# Patient Record
Sex: Male | Born: 1940 | Race: White | Hispanic: No | Marital: Single | Smoking: Former smoker
Health system: Southern US, Community
[De-identification: ages and names within clinical notes are randomized; demographics above are authoritative.]

## PROBLEM LIST (undated history)

## (undated) DIAGNOSIS — H3530 Unspecified macular degeneration: Secondary | ICD-10-CM

## (undated) DIAGNOSIS — I10 Essential (primary) hypertension: Secondary | ICD-10-CM

## (undated) DIAGNOSIS — J449 Chronic obstructive pulmonary disease, unspecified: Secondary | ICD-10-CM

## (undated) DIAGNOSIS — E119 Type 2 diabetes mellitus without complications: Secondary | ICD-10-CM

## (undated) DIAGNOSIS — Z800 Family history of malignant neoplasm of digestive organs: Secondary | ICD-10-CM

## (undated) DIAGNOSIS — G4730 Sleep apnea, unspecified: Secondary | ICD-10-CM

## (undated) DIAGNOSIS — E785 Hyperlipidemia, unspecified: Secondary | ICD-10-CM

## (undated) DIAGNOSIS — F32A Depression, unspecified: Secondary | ICD-10-CM

## (undated) DIAGNOSIS — F329 Major depressive disorder, single episode, unspecified: Secondary | ICD-10-CM

## (undated) DIAGNOSIS — B029 Zoster without complications: Secondary | ICD-10-CM

## (undated) HISTORY — DX: Family history of malignant neoplasm of digestive organs: Z80.0

## (undated) HISTORY — DX: Chronic obstructive pulmonary disease, unspecified: J44.9

## (undated) HISTORY — DX: Zoster without complications: B02.9

## (undated) HISTORY — PX: UPPER GASTROINTESTINAL ENDOSCOPY: SHX188

## (undated) HISTORY — DX: Unspecified macular degeneration: H35.30

## (undated) HISTORY — PX: COLONOSCOPY: SHX174

## (undated) HISTORY — PX: APPENDECTOMY: SHX54

## (undated) HISTORY — PX: TONSILLECTOMY: SUR1361

---

## 2013-07-02 ENCOUNTER — Emergency Department (HOSPITAL_COMMUNITY): Payer: MEDICARE

## 2013-07-02 ENCOUNTER — Inpatient Hospital Stay (HOSPITAL_COMMUNITY)
Admission: EM | Admit: 2013-07-02 | Discharge: 2013-07-06 | DRG: 086 | Disposition: A | Discharge status: Home / Self Care | Payer: MEDICARE | Attending: Internal Medicine | Admitting: Internal Medicine

## 2013-07-02 ENCOUNTER — Encounter (HOSPITAL_COMMUNITY): Payer: Self-pay | Admitting: Neurology

## 2013-07-02 DIAGNOSIS — F3289 Other specified depressive episodes: Secondary | ICD-10-CM | POA: Diagnosis present

## 2013-07-02 DIAGNOSIS — F329 Major depressive disorder, single episode, unspecified: Secondary | ICD-10-CM | POA: Diagnosis present

## 2013-07-02 DIAGNOSIS — H3530 Unspecified macular degeneration: Secondary | ICD-10-CM | POA: Diagnosis present

## 2013-07-02 DIAGNOSIS — R296 Repeated falls: Secondary | ICD-10-CM | POA: Diagnosis present

## 2013-07-02 DIAGNOSIS — D72829 Elevated white blood cell count, unspecified: Secondary | ICD-10-CM

## 2013-07-02 DIAGNOSIS — R55 Syncope and collapse: Secondary | ICD-10-CM

## 2013-07-02 DIAGNOSIS — N183 Chronic kidney disease, stage 3 unspecified: Secondary | ICD-10-CM | POA: Diagnosis present

## 2013-07-02 DIAGNOSIS — S066XAA Traumatic subarachnoid hemorrhage with loss of consciousness status unknown, initial encounter: Principal | ICD-10-CM | POA: Diagnosis present

## 2013-07-02 DIAGNOSIS — I129 Hypertensive chronic kidney disease with stage 1 through stage 4 chronic kidney disease, or unspecified chronic kidney disease: Secondary | ICD-10-CM | POA: Diagnosis present

## 2013-07-02 DIAGNOSIS — J181 Lobar pneumonia, unspecified organism: Secondary | ICD-10-CM

## 2013-07-02 DIAGNOSIS — Z87891 Personal history of nicotine dependence: Secondary | ICD-10-CM

## 2013-07-02 DIAGNOSIS — Z79899 Other long term (current) drug therapy: Secondary | ICD-10-CM

## 2013-07-02 DIAGNOSIS — J441 Chronic obstructive pulmonary disease with (acute) exacerbation: Secondary | ICD-10-CM

## 2013-07-02 DIAGNOSIS — E785 Hyperlipidemia, unspecified: Secondary | ICD-10-CM | POA: Diagnosis present

## 2013-07-02 DIAGNOSIS — N179 Acute kidney failure, unspecified: Secondary | ICD-10-CM | POA: Diagnosis present

## 2013-07-02 DIAGNOSIS — I609 Nontraumatic subarachnoid hemorrhage, unspecified: Secondary | ICD-10-CM | POA: Diagnosis present

## 2013-07-02 DIAGNOSIS — E119 Type 2 diabetes mellitus without complications: Secondary | ICD-10-CM | POA: Diagnosis present

## 2013-07-02 DIAGNOSIS — F32A Depression, unspecified: Secondary | ICD-10-CM

## 2013-07-02 DIAGNOSIS — Z7982 Long term (current) use of aspirin: Secondary | ICD-10-CM

## 2013-07-02 DIAGNOSIS — S066X9A Traumatic subarachnoid hemorrhage with loss of consciousness of unspecified duration, initial encounter: Principal | ICD-10-CM | POA: Diagnosis present

## 2013-07-02 DIAGNOSIS — R29810 Facial weakness: Secondary | ICD-10-CM | POA: Diagnosis present

## 2013-07-02 DIAGNOSIS — I951 Orthostatic hypotension: Secondary | ICD-10-CM | POA: Diagnosis present

## 2013-07-02 DIAGNOSIS — J189 Pneumonia, unspecified organism: Secondary | ICD-10-CM

## 2013-07-02 DIAGNOSIS — I639 Cerebral infarction, unspecified: Secondary | ICD-10-CM

## 2013-07-02 HISTORY — DX: Type 2 diabetes mellitus without complications: E11.9

## 2013-07-02 HISTORY — DX: Hyperlipidemia, unspecified: E78.5

## 2013-07-02 HISTORY — DX: Depression, unspecified: F32.A

## 2013-07-02 HISTORY — DX: Essential (primary) hypertension: I10

## 2013-07-02 HISTORY — DX: Major depressive disorder, single episode, unspecified: F32.9

## 2013-07-02 LAB — COMPREHENSIVE METABOLIC PANEL
ALT: 22 U/L (ref 0–53)
ALT: 22 U/L (ref 0–53)
AST: 21 U/L (ref 0–37)
AST: 19 U/L (ref 0–37)
Albumin: 3.8 g/dL (ref 3.5–5.2)
Albumin: 3.8 g/dL (ref 3.5–5.2)
Alkaline Phosphatase: 60 U/L (ref 39–117)
Alkaline Phosphatase: 63 U/L (ref 39–117)
BUN: 34 mg/dL — ABNORMAL HIGH (ref 6–23)
BUN: 32 mg/dL — ABNORMAL HIGH (ref 6–23)
CO2: 20 mEq/L (ref 19–32)
CO2: 22 mEq/L (ref 19–32)
Calcium: 9.4 mg/dL (ref 8.4–10.5)
Calcium: 9.7 mg/dL (ref 8.4–10.5)
Chloride: 101 mEq/L (ref 96–112)
Chloride: 102 mEq/L (ref 96–112)
Creatinine, Ser: 1.62 mg/dL — ABNORMAL HIGH (ref 0.50–1.35)
Creatinine, Ser: 1.55 mg/dL — ABNORMAL HIGH (ref 0.50–1.35)
GFR calc Af Amer: 47 mL/min — ABNORMAL LOW (ref >90)
GFR calc Af Amer: 50 mL/min — ABNORMAL LOW (ref >90)
GFR calc non Af Amer: 41 mL/min — ABNORMAL LOW (ref >90)
GFR calc non Af Amer: 43 mL/min — ABNORMAL LOW (ref >90)
Glucose, Bld: 134 mg/dL — ABNORMAL HIGH (ref 70–99)
Glucose, Bld: 123 mg/dL — ABNORMAL HIGH (ref 70–99)
Potassium: 4.8 mEq/L (ref 3.7–5.3)
Potassium: 4.7 mEq/L (ref 3.7–5.3)
Sodium: 139 mEq/L (ref 137–147)
Sodium: 140 mEq/L (ref 137–147)
Total Bilirubin: 0.3 mg/dL (ref 0.3–1.2)
Total Bilirubin: 0.4 mg/dL (ref 0.3–1.2)
Total Protein: 7.3 g/dL (ref 6.0–8.3)
Total Protein: 7.5 g/dL (ref 6.0–8.3)

## 2013-07-02 LAB — URINALYSIS, ROUTINE W REFLEX MICROSCOPIC
Glucose, UA: NEGATIVE mg/dL
Hgb urine dipstick: NEGATIVE
Ketones, ur: 15 mg/dL — AB
Leukocytes, UA: NEGATIVE
Nitrite: NEGATIVE
Protein, ur: NEGATIVE mg/dL
Specific Gravity, Urine: 1.03 (ref 1.005–1.030)
Urobilinogen, UA: 0.2 mg/dL (ref 0.0–1.0)
pH: 5 (ref 5.0–8.0)

## 2013-07-02 LAB — GLUCOSE, CAPILLARY: Glucose-Capillary: 190 mg/dL — ABNORMAL HIGH (ref 70–99)

## 2013-07-02 LAB — ETHANOL: Alcohol, Ethyl (B): <11 mg/dL (ref 0–11)

## 2013-07-02 LAB — DIFFERENTIAL
Basophils Absolute: 0 10*3/uL (ref 0.0–0.1)
Basophils Relative: 0 % (ref 0–1)
Eosinophils Absolute: 0.4 10*3/uL (ref 0.0–0.7)
Eosinophils Relative: 3 % (ref 0–5)
Lymphocytes Relative: 23 % (ref 12–46)
Lymphs Abs: 2.4 10*3/uL (ref 0.7–4.0)
Monocytes Absolute: 0.9 10*3/uL (ref 0.1–1.0)
Monocytes Relative: 8 % (ref 3–12)
Neutro Abs: 7 10*3/uL (ref 1.7–7.7)
Neutrophils Relative %: 66 % (ref 43–77)

## 2013-07-02 LAB — CBC
HCT: 36.6 % — ABNORMAL LOW (ref 39.0–52.0)
Hemoglobin: 12.3 g/dL — ABNORMAL LOW (ref 13.0–17.0)
MCH: 30.2 pg (ref 26.0–34.0)
MCHC: 33.6 g/dL (ref 30.0–36.0)
MCV: 89.9 fL (ref 78.0–100.0)
Platelets: 233 10*3/uL (ref 150–400)
RBC: 4.07 MIL/uL — ABNORMAL LOW (ref 4.22–5.81)
RDW: 13.2 % (ref 11.5–15.5)
WBC: 10.6 10*3/uL — ABNORMAL HIGH (ref 4.0–10.5)

## 2013-07-02 LAB — RAPID URINE DRUG SCREEN, HOSP PERFORMED
Amphetamines: POSITIVE — AB
Barbiturates: NOT DETECTED
Benzodiazepines: NOT DETECTED
Cocaine: NOT DETECTED
Opiates: NOT DETECTED
Tetrahydrocannabinol: NOT DETECTED

## 2013-07-02 LAB — I-STAT CHEM 8, ED
BUN: 33 mg/dL — ABNORMAL HIGH (ref 6–23)
Calcium, Ion: 1.26 mmol/L (ref 1.13–1.30)
Chloride: 106 mEq/L (ref 96–112)
Creatinine, Ser: 1.8 mg/dL — ABNORMAL HIGH (ref 0.50–1.35)
Glucose, Bld: 137 mg/dL — ABNORMAL HIGH (ref 70–99)
HCT: 39 % (ref 39.0–52.0)
Hemoglobin: 13.3 g/dL (ref 13.0–17.0)
Potassium: 4.6 mEq/L (ref 3.7–5.3)
Sodium: 141 mEq/L (ref 137–147)
TCO2: 20 mmol/L (ref 0–100)

## 2013-07-02 LAB — APTT: aPTT: 27 seconds (ref 24–37)

## 2013-07-02 LAB — PROTIME-INR
INR: 1.03 (ref 0.00–1.49)
Prothrombin Time: 13.3 seconds (ref 11.6–15.2)

## 2013-07-02 LAB — I-STAT TROPONIN, ED: Troponin i, poc: 0 ng/mL (ref 0.00–0.08)

## 2013-07-02 MED ORDER — FERROUS SULFATE 325 (65 FE) MG PO TBEC
325.0000 mg | DELAYED_RELEASE_TABLET | Freq: Every day | ORAL | Status: DC
  Administered 2013-07-03 – 2013-07-06 (×4): 325 mg via ORAL
  Filled 2013-07-02 (×6): qty 1

## 2013-07-02 MED ORDER — ADULT MULTIVITAMIN W/MINERALS CH
1.0000 | ORAL_TABLET | Freq: Every day | ORAL | Status: DC
  Administered 2013-07-02 – 2013-07-06 (×5): 1 via ORAL
  Filled 2013-07-02 (×5): qty 1

## 2013-07-02 MED ORDER — BUPROPION HCL ER (SR) 150 MG PO TB12
400.0000 mg | ORAL_TABLET | Freq: Every morning | ORAL | Status: DC
  Administered 2013-07-03 – 2013-07-06 (×4): 400 mg via ORAL
  Filled 2013-07-02 (×4): qty 1

## 2013-07-02 MED ORDER — PANTOPRAZOLE SODIUM 40 MG PO TBEC
80.0000 mg | DELAYED_RELEASE_TABLET | Freq: Every day | ORAL | Status: DC
  Administered 2013-07-02 – 2013-07-06 (×5): 80 mg via ORAL
  Filled 2013-07-02 (×5): qty 2

## 2013-07-02 MED ORDER — COLESTIPOL HCL 1 G PO TABS
1.0000 g | ORAL_TABLET | Freq: Every morning | ORAL | Status: DC
  Administered 2013-07-03 – 2013-07-06 (×4): 1 g via ORAL
  Filled 2013-07-02 (×5): qty 1

## 2013-07-02 MED ORDER — INSULIN ASPART 100 UNIT/ML ~~LOC~~ SOLN
0.0000 [IU] | Freq: Three times a day (TID) | SUBCUTANEOUS | Status: DC
  Administered 2013-07-03 (×3): 3 [IU] via SUBCUTANEOUS
  Administered 2013-07-04: 1 [IU] via SUBCUTANEOUS
  Administered 2013-07-04: 2 [IU] via SUBCUTANEOUS
  Administered 2013-07-05: 1 [IU] via SUBCUTANEOUS

## 2013-07-02 MED ORDER — SENNOSIDES-DOCUSATE SODIUM 8.6-50 MG PO TABS
1.0000 | ORAL_TABLET | Freq: Every evening | ORAL | Status: DC | PRN
  Administered 2013-07-06: 1 via ORAL
  Filled 2013-07-02 (×2): qty 1

## 2013-07-02 MED ORDER — VENLAFAXINE HCL ER 75 MG PO CP24
300.0000 mg | ORAL_CAPSULE | Freq: Every day | ORAL | Status: DC
  Administered 2013-07-03 – 2013-07-06 (×4): 300 mg via ORAL
  Filled 2013-07-02 (×4): qty 4
  Filled 2013-07-02: qty 2

## 2013-07-02 MED ORDER — LISINOPRIL 10 MG PO TABS
10.0000 mg | ORAL_TABLET | Freq: Every day | ORAL | Status: DC
  Filled 2013-07-02: qty 1

## 2013-07-02 MED ORDER — METHYLPREDNISOLONE SODIUM SUCC 125 MG IJ SOLR
125.0000 mg | Freq: Once | INTRAMUSCULAR | Status: AC
  Administered 2013-07-02: 125 mg via INTRAVENOUS
  Filled 2013-07-02: qty 2

## 2013-07-02 MED ORDER — IPRATROPIUM-ALBUTEROL 0.5-2.5 (3) MG/3ML IN SOLN
3.0000 mL | Freq: Once | RESPIRATORY_TRACT | Status: AC
  Administered 2013-07-02: 3 mL via RESPIRATORY_TRACT
  Filled 2013-07-02: qty 3

## 2013-07-02 NOTE — Progress Notes (Signed)
Unit CM UR Completed by [facility] ED CM  W. Danyell Awbrey RN  

## 2013-07-02 NOTE — ED Provider Notes (Signed)
CSN: [id number]     Arrival date & time 07/02/13  1702 History   First MD Initiated Contact with Patient 07/02/13 1743     Chief Complaint  Patient presents with  . Code Stroke    @EDPCLEARED @ (Consider location/radiation/quality/duration/timing/severity/associated sxs/prior Treatment) HPI Comments: Patient presents by EMS as a code stroke. He was found by his family member with difficulty speaking and facial droop. His last seen normal his last night. He is visiting from out of town. Patient thinks he might have fallen and found himself on the floor doesn't know how he got there. He was able to get himself to the couch which is where his wife found him when he got home. Patient denies any chest pain, stomach pain, back pain. He does endorse some shortness of breath and thinks he has bronchitis. Is a former smoker. History of hypertension and diabetes and hyperlipidemia. Seen by neurology on arrival.  The history is provided by the patient and the EMS personnel. The history is limited by the condition of the patient.    Past Medical History  Diagnosis Date  . Hypertension   . Diabetes   . Hyperlipidemia   . Depression    Past Surgical History  Procedure Laterality Date  . Tonsillectomy    . Appendectomy     Family History  Problem Relation Age of Onset  . Hypertension Mother   . Hypertension Father    History  Substance Use Topics  . Smoking status: Former Research scientist (life sciences)  . Smokeless tobacco: Never Used  . Alcohol Use: No    Review of Systems  Unable to perform ROS: Acuity of condition      Allergies  Review of patient's allergies indicates no known allergies.  Home Medications   Prior to Admission medications   Medication Sig Start Date End Date Taking? Authorizing Provider  aspirin EC 81 MG tablet Take 81 mg by mouth daily.   Yes Historical Provider, MD  buPROPion (WELLBUTRIN SR) 200 MG 12 hr tablet Take 400 mg by mouth every morning.   Yes Historical Provider, MD   colestipol (COLESTID) 1 G tablet Take 1 g by mouth every morning.    Yes Historical Provider, MD  diltiazem (DILACOR XR) 240 MG 24 hr capsule Take 240 mg by mouth daily.   Yes Historical Provider, MD  ferrous sulfate 325 (65 FE) MG EC tablet Take 325 mg by mouth daily with breakfast.   Yes Historical Provider, MD  lisinopril (PRINIVIL,ZESTRIL) 10 MG tablet Take 10 mg by mouth daily.   Yes Historical Provider, MD  Multiple Vitamin (MULTIVITAMIN WITH MINERALS) TABS tablet Take 1 tablet by mouth daily.   Yes Historical Provider, MD  omeprazole (PRILOSEC) 40 MG capsule Take 40 mg by mouth daily.   Yes Historical Provider, MD  venlafaxine XR (EFFEXOR-XR) 150 MG 24 hr capsule Take 300 mg by mouth daily with breakfast.   Yes Historical Provider, MD  zolpidem (AMBIEN) 10 MG tablet Take 10 mg by mouth at bedtime.    Yes Historical Provider, MD   BP 115/60  Pulse 66  Temp(Src) 98.5 F (36.9 C) (Oral)  Resp 18  Ht 5\' 11"  (1.803 m)  Wt 185 lb 10 oz (84.2 kg)  BMI 25.90 kg/m2  SpO2 97% Physical Exam  Nursing note and vitals reviewed. Constitutional: He is oriented to person, place, and time. He appears well-developed and well-nourished. No distress.  HENT:  Head: Normocephalic and atraumatic.  Mouth/Throat: Oropharynx is clear and moist. No oropharyngeal exudate.  Eyes: Conjunctivae and EOM are normal. Pupils are equal, round, and reactive to light.  Neck: Normal range of motion. Neck supple.  No meningismus No midline tenderness, paraspinal tenderness bilaterally  Cardiovascular: Normal rate, regular rhythm and normal heart sounds.   No murmur heard. +2 femoral, DP, PT pulses  Pulmonary/Chest: Effort normal and breath sounds normal. No respiratory distress. He has no wheezes.  Decreased breath sounds throughout  Abdominal: Soft. There is no tenderness. There is no rebound and no guarding.  Musculoskeletal: Normal range of motion. He exhibits no edema and no tenderness.  No calf swelling,  tenderness, or palpable cords  Neurological: He is alert and oriented to person, place, and time. No cranial nerve deficit. He exhibits normal muscle tone. Coordination normal.  5/5 strength throughout, slight left facial droop, no ataxia on finger to nose, no nystagmus  Skin: Skin is warm and dry. No rash noted.  Psychiatric: He has a normal mood and affect.    ED Course  Procedures (including critical care time) Labs Review Labs Reviewed  CBC - Abnormal; Notable for the following:    WBC 10.6 (*)    RBC 4.07 (*)    Hemoglobin 12.3 (*)    HCT 36.6 (*)    All other components within normal limits  COMPREHENSIVE METABOLIC PANEL - Abnormal; Notable for the following:    Glucose, Bld 134 (*)    BUN 34 (*)    Creatinine, Ser 1.62 (*)    GFR calc non Af Amer 41 (*)    GFR calc Af Amer 47 (*)    All other components within normal limits  URINE RAPID DRUG SCREEN (HOSP PERFORMED) - Abnormal; Notable for the following:    Amphetamines POSITIVE (*)    All other components within normal limits  URINALYSIS, ROUTINE W REFLEX MICROSCOPIC - Abnormal; Notable for the following:    Color, Urine AMBER (*)    Bilirubin Urine SMALL (*)    Ketones, ur 15 (*)    All other components within normal limits  HEMOGLOBIN A1C - Abnormal; Notable for the following:    Hemoglobin A1C 6.4 (*)    Mean Plasma Glucose 137 (*)    All other components within normal limits  LIPID PANEL - Abnormal; Notable for the following:    Cholesterol 227 (*)    Triglycerides 354 (*)    HDL 37 (*)    VLDL 71 (*)    LDL Cholesterol 119 (*)    All other components within normal limits  COMPREHENSIVE METABOLIC PANEL - Abnormal; Notable for the following:    Glucose, Bld 123 (*)    BUN 32 (*)    Creatinine, Ser 1.55 (*)    GFR calc non Af Amer 43 (*)    GFR calc Af Amer 50 (*)    All other components within normal limits  GLUCOSE, CAPILLARY - Abnormal; Notable for the following:    Glucose-Capillary 190 (*)    All  other components within normal limits  GLUCOSE, CAPILLARY - Abnormal; Notable for the following:    Glucose-Capillary 232 (*)    All other components within normal limits  I-STAT CHEM 8, ED - Abnormal; Notable for the following:    BUN 33 (*)    Creatinine, Ser 1.80 (*)    Glucose, Bld 137 (*)    All other components within normal limits  ETHANOL  PROTIME-INR  APTT  DIFFERENTIAL  I-STAT TROPOININ, ED  I-STAT TROPOININ, ED    Imaging Review Dg  Chest 2 View  07/02/2013   CLINICAL DATA:  Short of breath / and hypoxemia. Diabetes and hypertension.  EXAM: CHEST  2 VIEW  COMPARISON:  None.  FINDINGS: Mild hyperinflation. Artifact degradation posteriorly on the lateral view.  Midline trachea. Normal heart size and mediastinal contours for age. No pleural effusion or pneumothorax. Favor scarring at the lung bases, greater on the left. No lobar consolidation. Cannot exclude left upper lobe pulmonary nodule. On the order of 2.0 cm.  IMPRESSION: Hyperinflation, without acute superimposed process or other explanation for shortness of Breath.  Probable scarring at the lung bases, greater on the left. Comparison with prior radiographs would be informative.  Cannot exclude subtle left upper lobe pulmonary nodule. This alternatively could represent summation of osseous shadows. Given the smoking history, chest CT should be considered. If this is not performed, plain film radiographic followup at 3 months should be considered.   Electronically Signed   By: Abigail Miyamoto M.D.   On: 07/02/2013 19:14   Ct Head Wo Contrast  07/02/2013   CLINICAL DATA:  Stroke alert.  Fall.  EXAM: CT HEAD WITHOUT CONTRAST  TECHNIQUE: Contiguous axial images were obtained from the base of the skull through the vertex without intravenous contrast.  COMPARISON:  None.  FINDINGS: Diffuse prominence of the CSF containing spaces is compatible with generalized cerebral atrophy. Scattered and confluent hypodensity within the periventricular  and deep white matter both cerebral hemispheres is most consistent with chronic small vessel ischemic disease.  There is trace hyperdensity within the medial left frontal lobe adjacent to the anterior falx (image 24), suspicious for acute subarachnoid hemorrhage. No significant mass effect. Additional hyperdense foci seen slightly more inferiorly within the parasagittal right frontal lobe may represent small amount of blood as well (series 2, image 21).  No definite acute large vessel territory infarct identified. Wedge-shaped hypodensity within the left cerebellar hemisphere is most compatible with remote infarct. Remote lacunar infarct present within the left basal ganglia.  Prominent vascular calcifications present within the carotid siphons and distal vertebral arteries.  No mass lesion or midline shift. No hydrocephalus. No extra-axial fluid collection.  Air-fluid level present within the left maxillary sinus. Minimal opacity noted within the left sphenoid sinus. Mild mucoperiosteal thickening present within the frontal sinuses bilaterally.  Calvarium is intact. Scalp soft tissues within normal limits. No acute orbital abnormality.  IMPRESSION: 1. Trace acute subarachnoid hemorrhage within the parafalcine bilateral frontal lobes without significant mass effect. Finding is likely posttraumatic related to fall. 2. Age-indeterminate hypodensity within the left temporal lobe, suspicious for acute ischemia. Further evaluation with brain MRI would likely be helpful for further evaluation. 3. 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 4. Generalized cerebral atrophy with advanced chronic microvascular ischemic disease. 5. Left maxillary sinusitis. Critical Value/emergent results were called by telephone at the time of interpretation on 07/02/2013 at 5:24 PM to Dr. Armida Sans , who verbally acknowledged these results.   Electronically Signed   By: Jeannine Boga M.D.   On: 07/02/2013 17:36   Ct Cervical  Spine Wo Contrast  07/02/2013   CLINICAL DATA:  Stroke.  Possible trauma to  EXAM: CT CERVICAL SPINE WITHOUT CONTRAST  TECHNIQUE: Multidetector CT imaging of the cervical spine was performed without intravenous contrast. Multiplanar CT image reconstructions were also generated.  COMPARISON:  Head CT 07/02/2013.  FINDINGS: [place] cervical lymph nodes are present. Cervical airway is patent. Thyroid is unremarkable. Pulmonary apices are clear.  Diffuse degenerative changes cervical spine. No evidence of  fracture or dislocation. Noted however is 3.6 mm anterior subluxation of C2 on C3 and 5.4 mm anterior subluxation of C3 on C4. Although these changes could be degenerative ligamentous injury cannot be excluded.  IMPRESSION: 1. 3.6 mm anterior subluxation C2 on C3. 2. 5.4 mm anterior subluxation C3 on C4. Although these changes could be secondary to diffuse degenerative change present. Ligamentous injury with subluxation cannot be excluded. 3. No evidence of fracture or dislocation.   Electronically Signed   By: Marcello Moores  Register   On: 07/02/2013 19:17   Mr Jodene Nam Head Wo Contrast  07/02/2013   CLINICAL DATA:  Facial droop after a fall. Left facial weakness and speech difficulty.  EXAM: MRI HEAD WITHOUT CONTRAST  MRA HEAD WITHOUT CONTRAST  TECHNIQUE: Multiplanar, multiecho pulse sequences of the brain and surrounding structures were obtained without intravenous contrast. Angiographic images of the head were obtained using MRA technique without contrast.  COMPARISON:  CT head earlier in the day.  FINDINGS: MRI HEAD FINDINGS  No acute stroke. No mass lesion, hydrocephalus, or extra-axial fluid.  Generalized atrophy. Chronic microvascular ischemic change of a moderate degree. Tiny focus of chronic hemorrhage left parietal cortex. Scattered the [place] throughout the basal ganglia and deep white matter. Dolichoectatic cerebral vasculature with wide patency; right vertebral is either atretic or ends in PICA. Bilateral old  cerebellar infarcts.  There is evidence for acute subarachnoid hemorrhage in the anterior interhemispheric fissure as suspected from CT. Left sylvian fissure subarachnoid blood not definitely present but could be difficult to detect due to the axial plane of FLAIR imaging. No intraventricular hemorrhage or pre pontine hemorrhage. No midline shift. No osseous findings. Air-fluid level left maxillary sinus felt inflammatory, not posttraumatic. No mastoid fluid. No midline abnormality. No upper cervical lesions. Midline posterior scalp hematoma.  MRA HEAD FINDINGS  The internal carotid arteries are dolichoectatic but widely patent. The basilar artery is solely flow supplied by the left vertebral. These both are widely patent. No proximal stenosis of the anterior, middle, or posterior cerebral arteries. No cerebellar branch occlusion. No intracranial aneurysm.  IMPRESSION: Posttraumatic subarachnoid hemorrhage in the anterior interhemispheric fissure. No parenchymal hemorrhage is evident other than a tiny focus of chronic hemorrhage left parietal cortex.  Atrophy and small vessel disease.  No vascular occlusion or significant stenosis.   Electronically Signed   By: Rolla Flatten M.D.   On: 07/02/2013 20:12   Mr Brain Wo Contrast  07/02/2013   CLINICAL DATA:  Facial droop after a fall. Left facial weakness and speech difficulty.  EXAM: MRI HEAD WITHOUT CONTRAST  MRA HEAD WITHOUT CONTRAST  TECHNIQUE: Multiplanar, multiecho pulse sequences of the brain and surrounding structures were obtained without intravenous contrast. Angiographic images of the head were obtained using MRA technique without contrast.  COMPARISON:  CT head earlier in the day.  FINDINGS: MRI HEAD FINDINGS  No acute stroke. No mass lesion, hydrocephalus, or extra-axial fluid.  Generalized atrophy. Chronic microvascular ischemic change of a moderate degree. Tiny focus of chronic hemorrhage left parietal cortex. Scattered the [place] throughout the basal  ganglia and deep white matter. Dolichoectatic cerebral vasculature with wide patency; right vertebral is either atretic or ends in PICA. 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  There is evidence for acute subarachnoid hemorrhage in the anterior interhemispheric fissure as suspected from CT. Left sylvian fissure subarachnoid blood not definitely present but could be difficult to detect due to the axial plane of FLAIR imaging. No intraventricular hemorrhage or pre pontine hemorrhage. No midline shift. No osseous  findings. Air-fluid level left maxillary sinus felt inflammatory, not posttraumatic. No mastoid fluid. No midline abnormality. No upper cervical lesions. Midline posterior scalp hematoma.  MRA HEAD FINDINGS  The internal carotid arteries are dolichoectatic but widely patent. The basilar artery is solely flow supplied by the left vertebral. These both are widely patent. No proximal stenosis of the anterior, middle, or posterior cerebral arteries. No cerebellar branch occlusion. No intracranial aneurysm.  IMPRESSION: Posttraumatic subarachnoid hemorrhage in the anterior interhemispheric fissure. No parenchymal hemorrhage is evident other than a tiny focus of chronic hemorrhage left parietal cortex.  Atrophy and small vessel disease.  No vascular occlusion or significant stenosis.   Electronically Signed   By: Rolla Flatten M.D.   On: 07/02/2013 20:12   Mr Cervical Spine Wo Contrast  07/02/2013   CLINICAL DATA:  Status post fall. Abnormal CT cervical spine. Neck pain.  EXAM: MRI CERVICAL SPINE WITHOUT CONTRAST  TECHNIQUE: Multiplanar, multisequence MR imaging of the cervical spine was performed. No intravenous contrast was administered.  COMPARISON:  CT cervical spine 07/02/2013.  FINDINGS: Minor anterolisthesis at C3-4 has reduced somewhat compared to the CT, with anatomic alignment at C2-3, C4-5, C5-6, C6-7, and C7-T1 on MR. as seen on MR, anterolisthesis at C3-4 measures no more than 2 mm.  No vertebral body  compression deformity, prevertebral soft tissue swelling, ligamentous injury, cord contusion, hematomyelia, or neck mass.  Chronically diseased or hypoplastic/atretic right vertebral. Craniocervical junction unremarkable.  The individual disc spaces were examined as follows:  C2-3:  Unremarkable disc space.  Bilateral facet arthropathy.  C3-4: Central protrusion severe facet arthropathy on the left with uncinate spurring. Left C4 nerve root impingement is evident.  C4-5:  Central protrusion.  Mild canal stenosis.  No impingement.  C5-6: Moderate disc space narrowing. Central protrusion with bilateral uncinate spurring. Mild central canal stenosis. Bilateral C6 nerve root impingement.  C6-7: Moderate disc space narrowing. Central protrusion with left greater than right uncinate spurring. Bilateral C7 nerve root impingement, worse on the left.  C7-T1:  Unremarkable.  IMPRESSION: Multilevel spondylosis. No cervical spine fracture or traumatic subluxation. No epidural hematoma or cord contusion.  The observed CT findings at C2-3, and C3-4 appear to be related to facet disease. There is no evidence for ligamentous injury or prevertebral soft tissue swelling.   Electronically Signed   By: Rolla Flatten M.D.   On: 07/02/2013 20:35     EKG Interpretation None      MDM   Final diagnoses:  SAH (subarachnoid hemorrhage)  Cerebral infarction due to unspecified mechanism  COPD exacerbation   Patient from home with fall and transient left-sided weakness and difficulty speaking. Code stroke on arrival.  CT head shows trace amount of subarachnoid hemorrhage without mass effect. There is age-indeterminate hypodensity left temporal lobe. Patient is not a TPA candidate secondary to hemorrhage as well as delayed presentation. Subarachnoid hemorrhage was discussed with Dr. Saintclair Halsted of neurosurgery who agrees no acute intervention. Neurology does not feel patient's area of ischemia explains his symptoms. CT scan of the  C-spine show some subluxation of C2 on C3 and C3 on C4. We'll place him c-collar and obtain MRI.  Patient with decreased breath sounds and borderline O2 saturations in low 90s.  Suspect undiagnosed COPD as he is a smoker.  Nebs given.  MRI C spine without acute ligamentous injury. Subluxation likely from facet disease.  Neurology recommends medical admission for further stroke workup.  There is questionable syncope as well which will need to be evaluated  further.  D/w Dr. Ernestina Patches    Date: 07/02/2013  Rate: 60  Rhythm: normal sinus rhythm  QRS Axis: normal   Intervals: normal  ST/T Wave abnormalities: normal  Conduction Disutrbances:none  Narrative Interpretation:   Old EKG Reviewed: none available  CRITICAL CARE Performed by: Ezequiel Essex Total critical care time: 30 Critical care time was exclusive of separately billable procedures and treating other patients. Critical care was necessary to treat or prevent imminent or life-threatening deterioration. Critical care was time spent personally by me on the following activities: development of treatment plan with patient and/or surrogate as well as nursing, discussions with consultants, evaluation of patient's response to treatment, examination of patient, obtaining history from patient or surrogate, ordering and performing treatments and interventions, ordering and review of laboratory studies, ordering and review of radiographic studies, pulse oximetry and re-evaluation of patient's condition.     Ezequiel Essex, MD 07/03/13 [phone]

## 2013-07-02 NOTE — H&P (Addendum)
Hospitalist Admission History and Physical  Patient name: Jesse Nunez Medical record number: [id number] Date of birth: 1940-08-17 Age: 73 y.o. Gender: male  Primary Care Provider: PROVIDER NOT IN SYSTEM  Chief Complaint: syncope, ? CVA, SAH  History of Present Illness:This is a 73 y.o. year old male with no prior medical history of hypertension, hyperlipidemia, type 2 diabetes presenting with syncope, dizziness,? CVA, subarachnoid hemorrhage. Patient is visiting out of town from [place] to see his sister. Per port, he has had intermittent episodes of dizziness and confusion that started yesterday. Patient had a witnessed episode of syncope earlier today that was proceeded by dizziness. We'll go from episode with neck pain as well as head pain. Had transient confusion that resolved within one minute of waking. One sister came home, she was made aware of incident and patient subsequently brought to the ER. Upon presentation, occult stroke was called. The patient was also with TPA window given duration of symptoms. Presented to the ER afebrile, hemodynamically stable. Pressures in the 90s to 110s. Satting greater than 91% on room air. White blood cell count 10.6, hemoglobin 12.3, creatinine 1.8, BUN 34, glucose 137. Head head and cervical spine CT done that showed trace acute subarachnoid hemorrhage within the parafalcine bilateral frontal lobes without significant mass effect possibly posttraumatic related. 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 Chest x-ray with questionable subtle left upper lobe pulmonary nodule. Neurology consult. Recommendation for stroke workup. Holding antiplatelet therapy given small area of bifrontal subarachnoid hemorrhage.   Assessment and Plan: Keygan Dumond is a 73 y.o. year old male presenting with syncope,? CVA, subarachnoid  hemorrhage   Syncope: High concern for neurocardiogenic source given overall presentation. Proceed with stroke workup including MRI/MRA, 2-D echo, carotid Dopplers, risk stratification labs. Currently pending. Hold antiplatelet treatment. Appreciate neurology input. Subarachnoid hemorrhage: Case initially discussed with neurosurgery by ER physician per her report. Likely posttraumatic. Currently immobilized in neck brace. Continue to follow.  Hypertension: Lower limit of normal blood pressures on presentation. Continue ACE inhibitor. Titrate beta blocker as clinically indicated. Would like to keep blood pressures within 25% of initial admission baseline.  Diabetes: Sliding-scale insulin. A1c  AK I.: Stage III on presentation. Unclear if this is baseline. Suspect chronic issue in the setting of hypertension and diabetes. We'll continue to follow. Hydrate and reassess. Hyperlipidemia: Lipid panel. FEN/GI: Heart healthy or modified diet pending bedside swallow eval. PPI. Prophylaxis: Lovenox Disposition: Pending further evaluation Code Status: Full code   Patient Active Problem List   Diagnosis Date Noted  . Syncope 07/02/2013   Past Medical History: Past Medical History  Diagnosis Date  . Hypertension   . Diabetes   . Hyperlipidemia   . Depression     Past Surgical History: Past Surgical History  Procedure Laterality Date  . Tonsillectomy    . Appendectomy      Social History: History   Social History  . Marital Status: Single    Spouse Name: N/A    Number of Children: N/A  . Years of Education: N/A   Social History Main Topics  . Smoking status: Former Research scientist (life sciences)  . Smokeless tobacco: Never Used  . Alcohol Use: No  . Drug Use: No  . Sexual Activity: None   Other Topics Concern  . None   Social History Narrative  . None    Family History: Family History  Problem Relation Age of Onset  .  Hypertension Mother   . Hypertension Father     Allergies: No Known  Allergies  Current Facility-Administered Medications  Medication Dose Route Frequency Provider Last Rate Last Dose  . [START ON 07/03/2013] buPROPion (WELLBUTRIN SR) 12 hr tablet 400 mg  400 mg Oral q morning - 10a Shanda Howells, MD      . Derrill Memo ON 07/03/2013] colestipol (COLESTID) tablet 1 g  1 g Oral q morning - 10a Shanda Howells, MD      . Derrill Memo ON 07/03/2013] ferrous sulfate EC tablet 325 mg  325 mg Oral Q breakfast Shanda Howells, MD      . lisinopril (PRINIVIL,ZESTRIL) tablet 10 mg  10 mg Oral Daily Shanda Howells, MD      . multivitamin with minerals tablet 1 tablet  1 tablet Oral Daily Shanda Howells, MD      . pantoprazole (PROTONIX) EC tablet 80 mg  80 mg Oral Daily Shanda Howells, MD      . senna-docusate (Senokot-S) tablet 1 tablet  1 tablet Oral QHS PRN Shanda Howells, MD      . Derrill Memo ON 07/03/2013] venlafaxine XR (EFFEXOR-XR) 24 hr capsule 300 mg  300 mg Oral Q breakfast Shanda Howells, MD       Current Outpatient Prescriptions  Medication Sig Dispense Refill  . aspirin EC 81 MG tablet Take 81 mg by mouth daily.      Marland Kitchen buPROPion (WELLBUTRIN SR) 200 MG 12 hr tablet Take 400 mg by mouth every morning.      . colestipol (COLESTID) 1 G tablet Take 1 g by mouth every morning.       . diltiazem (DILACOR XR) 240 MG 24 hr capsule Take 240 mg by mouth daily.      . ferrous sulfate 325 (65 FE) MG EC tablet Take 325 mg by mouth daily with breakfast.      . lisinopril (PRINIVIL,ZESTRIL) 10 MG tablet Take 10 mg by mouth daily.      . Multiple Vitamin (MULTIVITAMIN WITH MINERALS) TABS tablet Take 1 tablet by mouth daily.      Marland Kitchen omeprazole (PRILOSEC) 40 MG capsule Take 40 mg by mouth daily.      Marland Kitchen venlafaxine XR (EFFEXOR-XR) 150 MG 24 hr capsule Take 300 mg by mouth daily with breakfast.      . zolpidem (AMBIEN) 10 MG tablet Take 10 mg by mouth at bedtime.        Review Of Systems: 12 point ROS negative except as noted above in HPI.  Physical Exam: Filed Vitals:   07/02/13 2115  BP: 97/52   Pulse: 58  Temp:   Resp: 22    General: alert and cooperative HEENT: PERRLA and extra ocular movement intact Heart: S1, S2 normal, no murmur, rub or gallop, regular rate and rhythm Lungs: clear to auscultation, no wheezes or rales and unlabored breathing Abdomen: abdomen is soft without significant tenderness, masses, organomegaly or guarding Extremities: extremities normal, atraumatic, no cyanosis or edema Skin:no rashes, no ecchymoses Neurology: normal without focal findings  Labs and Imaging: Lab Results  Component Value Date/Time   NA 140 07/02/2013  9:00 PM   K 4.7 07/02/2013  9:00 PM   CL 102 07/02/2013  9:00 PM   CO2 22 07/02/2013  9:00 PM   BUN 32* 07/02/2013  9:00 PM   CREATININE 1.55* 07/02/2013  9:00 PM   GLUCOSE 123* 07/02/2013  9:00 PM   Lab Results  Component Value Date   WBC 10.6* 07/02/2013   HGB  13.3 07/02/2013   HCT 39.0 07/02/2013   MCV 89.9 07/02/2013   PLT 233 07/02/2013    Dg Chest 2 View  07/02/2013   CLINICAL DATA:  Short of breath / and hypoxemia. Diabetes and hypertension.  EXAM: CHEST  2 VIEW  COMPARISON:  None.  FINDINGS: Mild hyperinflation. Artifact degradation posteriorly on the lateral view.  Midline trachea. Normal heart size and mediastinal contours for age. No pleural effusion or pneumothorax. Favor scarring at the lung bases, greater on the left. No lobar consolidation. Cannot exclude left upper lobe pulmonary nodule. On the order of 2.0 cm.  IMPRESSION: Hyperinflation, without acute superimposed process or other explanation for shortness of Breath.  Probable scarring at the lung bases, greater on the left. Comparison with prior radiographs would be informative.  Cannot exclude subtle left upper lobe pulmonary nodule. This alternatively could represent summation of osseous shadows. Given the smoking history, chest CT should be considered. If this is not performed, plain film radiographic followup at 3 months should be considered.   Electronically Signed    By: Abigail Miyamoto M.D.   On: 07/02/2013 19:14   Ct Head Wo Contrast  07/02/2013   CLINICAL DATA:  Stroke alert.  Fall.  EXAM: CT HEAD WITHOUT CONTRAST  TECHNIQUE: Contiguous axial images were obtained from the base of the skull through the vertex without intravenous contrast.  COMPARISON:  None.  FINDINGS: Diffuse prominence of the CSF containing spaces is compatible with generalized cerebral atrophy. Scattered and confluent hypodensity within the periventricular and deep white matter both cerebral hemispheres is most consistent with chronic small vessel ischemic disease.  There is trace hyperdensity within the medial left frontal lobe adjacent to the anterior falx (image 24), suspicious for acute subarachnoid hemorrhage. No significant mass effect. Additional hyperdense foci seen slightly more inferiorly within the parasagittal right frontal lobe may represent small amount of blood as well (series 2, image 21).  No definite acute large vessel territory infarct identified. Wedge-shaped hypodensity within the left cerebellar hemisphere is most compatible with remote infarct. Remote lacunar infarct present within the left basal ganglia.  Prominent vascular calcifications present within the carotid siphons and distal vertebral arteries.  No mass lesion or midline shift. No hydrocephalus. No extra-axial fluid collection.  Air-fluid level present within the left maxillary sinus. Minimal opacity noted within the left sphenoid sinus. Mild mucoperiosteal thickening present within the frontal sinuses bilaterally.  Calvarium is intact. Scalp soft tissues within normal limits. No acute orbital abnormality.  IMPRESSION: 1. Trace acute subarachnoid hemorrhage within the parafalcine bilateral frontal lobes without significant mass effect. Finding is likely posttraumatic related to fall. 2. Age-indeterminate hypodensity within the left temporal lobe, suspicious for acute ischemia. Further evaluation with brain MRI would likely  be helpful for further evaluation. 3. 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 4. Generalized cerebral atrophy with advanced chronic microvascular ischemic disease. 5. Left maxillary sinusitis. Critical Value/emergent results were called by telephone at the time of interpretation on 07/02/2013 at 5:24 PM to Dr. Armida Sans , who verbally acknowledged these results.   Electronically Signed   By: Jeannine Boga M.D.   On: 07/02/2013 17:36   Ct Cervical Spine Wo Contrast  07/02/2013   CLINICAL DATA:  Stroke.  Possible trauma to  EXAM: CT CERVICAL SPINE WITHOUT CONTRAST  TECHNIQUE: Multidetector CT imaging of the cervical spine was performed without intravenous contrast. Multiplanar CT image reconstructions were also generated.  COMPARISON:  Head CT 07/02/2013.  FINDINGS: Shotty cervical lymph nodes are  present. Cervical airway is patent. Thyroid is unremarkable. Pulmonary apices are clear.  Diffuse degenerative changes cervical spine. No evidence of fracture or dislocation. Noted however is 3.6 mm anterior subluxation of C2 on C3 and 5.4 mm anterior subluxation of C3 on C4. Although these changes could be degenerative ligamentous injury cannot be excluded.  IMPRESSION: 1. 3.6 mm anterior subluxation C2 on C3. 2. 5.4 mm anterior subluxation C3 on C4. Although these changes could be secondary to diffuse degenerative change present. Ligamentous injury with subluxation cannot be excluded. 3. No evidence of fracture or dislocation.   Electronically Signed   By: Marcello Moores  Register   On: 07/02/2013 19:17   Mr Jodene Nam Head Wo Contrast  07/02/2013   CLINICAL DATA:  Facial droop after a fall. Left facial weakness and speech difficulty.  EXAM: MRI HEAD WITHOUT CONTRAST  MRA HEAD WITHOUT CONTRAST  TECHNIQUE: Multiplanar, multiecho pulse sequences of the brain and surrounding structures were obtained without intravenous contrast. Angiographic images of the head were obtained using MRA technique without contrast.   COMPARISON:  CT head earlier in the day.  FINDINGS: MRI HEAD FINDINGS  No acute stroke. No mass lesion, hydrocephalus, or extra-axial fluid.  Generalized atrophy. Chronic microvascular ischemic change of a moderate degree. Tiny focus of chronic hemorrhage left parietal cortex. Scattered the [place] throughout the basal ganglia and deep white matter. Dolichoectatic cerebral vasculature with wide patency; right vertebral is either atretic or ends in PICA. 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  There is evidence for acute subarachnoid hemorrhage in the anterior interhemispheric fissure as suspected from CT. Left sylvian fissure subarachnoid blood not definitely present but could be difficult to detect due to the axial plane of FLAIR imaging. No intraventricular hemorrhage or pre pontine hemorrhage. No midline shift. No osseous findings. Air-fluid level left maxillary sinus felt inflammatory, not posttraumatic. No mastoid fluid. No midline abnormality. No upper cervical lesions. Midline posterior scalp hematoma.  MRA HEAD FINDINGS  The internal carotid arteries are dolichoectatic but widely patent. The basilar artery is solely flow supplied by the left vertebral. These both are widely patent. No proximal stenosis of the anterior, middle, or posterior cerebral arteries. No cerebellar branch occlusion. No intracranial aneurysm.  IMPRESSION: Posttraumatic subarachnoid hemorrhage in the anterior interhemispheric fissure. No parenchymal hemorrhage is evident other than a tiny focus of chronic hemorrhage left parietal cortex.  Atrophy and small vessel disease.  No vascular occlusion or significant stenosis.   Electronically Signed   By: Rolla Flatten M.D.   On: 07/02/2013 20:12   Mr Brain Wo Contrast  07/02/2013   CLINICAL DATA:  Facial droop after a fall. Left facial weakness and speech difficulty.  EXAM: MRI HEAD WITHOUT CONTRAST  MRA HEAD WITHOUT CONTRAST  TECHNIQUE: Multiplanar, multiecho pulse sequences of the brain and  surrounding structures were obtained without intravenous contrast. Angiographic images of the head were obtained using MRA technique without contrast.  COMPARISON:  CT head earlier in the day.  FINDINGS: MRI HEAD FINDINGS  No acute stroke. No mass lesion, hydrocephalus, or extra-axial fluid.  Generalized atrophy. Chronic microvascular ischemic change of a moderate degree. Tiny focus of chronic hemorrhage left parietal cortex. Scattered the [place] throughout the basal ganglia and deep white matter. Dolichoectatic cerebral vasculature with wide patency; right vertebral is either atretic or ends in PICA. 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  There is evidence for acute subarachnoid hemorrhage in the anterior interhemispheric fissure as suspected from CT. Left sylvian fissure subarachnoid blood not definitely present but could be difficult to  detect due to the axial plane of FLAIR imaging. No intraventricular hemorrhage or pre pontine hemorrhage. No midline shift. No osseous findings. Air-fluid level left maxillary sinus felt inflammatory, not posttraumatic. No mastoid fluid. No midline abnormality. No upper cervical lesions. Midline posterior scalp hematoma.  MRA HEAD FINDINGS  The internal carotid arteries are dolichoectatic but widely patent. The basilar artery is solely flow supplied by the left vertebral. These both are widely patent. No proximal stenosis of the anterior, middle, or posterior cerebral arteries. No cerebellar branch occlusion. No intracranial aneurysm.  IMPRESSION: Posttraumatic subarachnoid hemorrhage in the anterior interhemispheric fissure. No parenchymal hemorrhage is evident other than a tiny focus of chronic hemorrhage left parietal cortex.  Atrophy and small vessel disease.  No vascular occlusion or significant stenosis.   Electronically Signed   By: Rolla Flatten M.D.   On: 07/02/2013 20:12   Mr Cervical Spine Wo Contrast  07/02/2013   CLINICAL DATA:  Status post fall. Abnormal CT cervical  spine. Neck pain.  EXAM: MRI CERVICAL SPINE WITHOUT CONTRAST  TECHNIQUE: Multiplanar, multisequence MR imaging of the cervical spine was performed. No intravenous contrast was administered.  COMPARISON:  CT cervical spine 07/02/2013.  FINDINGS: Minor anterolisthesis at C3-4 has reduced somewhat compared to the CT, with anatomic alignment at C2-3, C4-5, C5-6, C6-7, and C7-T1 on MR. as seen on MR, anterolisthesis at C3-4 measures no more than 2 mm.  No vertebral body compression deformity, prevertebral soft tissue swelling, ligamentous injury, cord contusion, hematomyelia, or neck mass.  Chronically diseased or hypoplastic/atretic right vertebral. Craniocervical junction unremarkable.  The individual disc spaces were examined as follows:  C2-3:  Unremarkable disc space.  Bilateral facet arthropathy.  C3-4: Central protrusion severe facet arthropathy on the left with uncinate spurring. Left C4 nerve root impingement is evident.  C4-5:  Central protrusion.  Mild canal stenosis.  No impingement.  C5-6: Moderate disc space narrowing. Central protrusion with bilateral uncinate spurring. Mild central canal stenosis. Bilateral C6 nerve root impingement.  C6-7: Moderate disc space narrowing. Central protrusion with left greater than right uncinate spurring. Bilateral C7 nerve root impingement, worse on the left.  C7-T1:  Unremarkable.  IMPRESSION: Multilevel spondylosis. No cervical spine fracture or traumatic subluxation. No epidural hematoma or cord contusion.  The observed CT findings at C2-3, and C3-4 appear to be related to facet disease. There is no evidence for ligamentous injury or prevertebral soft tissue swelling.   Electronically Signed   By: Rolla Flatten M.D.   On: 07/02/2013 20:35           Shanda Howells MD  Pager: [phone]

## 2013-07-02 NOTE — Code Documentation (Signed)
Code stroke called at 1642, Patient arrived to [facility] ED via [facility] at [phone].  As per EMS patient was LSN at 0900 by his sister, sister found him at home laying on couch with slurred speech, confusion and droop.  Patient states he fell at some point during the day and crawled to couch, does not remember the time it happened.  Patient also states he did not feel well all day.  [facility] 2, cancelled at 1715

## 2013-07-02 NOTE — Consult Note (Signed)
Referring Physician: ED    Chief Complaint: Code stroke  HPI:                                                                                                                                         Jesse Nunez is an 73 y.o. male with a past medical history significant for HTN, hyperlipidemia, DM, and macular degeneration, who was last seen normal by his wife this AM at 0900.  She came home and found him with a left facial droop.  Per wife he had fallen and then made his way to his couch. Wife told EMS that he patient told he probably fainted and afterwards was disoriented and little bit confused  At time of arrival he was out of window for tPA and intervention. initial exam showed only left facial droop.  NIHSS 2 CT brain showed trace acute subarachnoid hemorrhage within the parafalcine bilateral frontal lobes without significant mass effect and age-indeterminate hypodensity within the left temporal lobe, suspicious for acute ischemia. Patient is on aspirin 81 mg and he is not sure is he takes plavix. Of importance, he reports chronic dizziness and frequent problems with low blood pressure. Date last known well: Date: 07/02/2013 Time last known well: Time: 09:00 tPA Given: No: out of the window, possible small post traumatic SAH.  Past Medical History  Diagnosis Date  . Hypertension   . Diabetes   . Hyperlipidemia     No past surgical history on file.  Family History  Problem Relation Age of Onset  . Hypertension Mother   . Hypertension Father    Social History:  has no tobacco, alcohol, and drug history on file.  Allergies: Allergies not on file  Medications:                                                                                                                           No current facility-administered medications for this encounter.   No current outpatient prescriptions on file.     ROS:  History obtained from the patient  General ROS: negative for - chills, fatigue, fever, night sweats, weight gain or weight loss Psychological ROS: negative for - behavioral disorder, hallucinations, memory difficulties, mood swings or suicidal ideation Ophthalmic ROS: negative for - blurry vision, double vision, eye pain or loss of vision ENT ROS: negative for - epistaxis, nasal discharge, oral lesions, sore throat, tinnitus or vertigo Allergy and Immunology ROS: negative for - hives or itchy/watery eyes Hematological and Lymphatic ROS: negative for - bleeding problems, bruising or swollen lymph nodes Endocrine ROS: negative for - galactorrhea, hair pattern changes, polydipsia/polyuria or temperature intolerance Respiratory ROS: negative for - cough, hemoptysis, shortness of breath or wheezing Cardiovascular ROS: negative for - chest pain, dyspnea on exertion, edema or irregular heartbeat Gastrointestinal ROS: negative for - abdominal pain, diarrhea, hematemesis, nausea/vomiting or stool incontinence Genito-Urinary ROS: negative for - dysuria, hematuria, incontinence or urinary frequency/urgency Musculoskeletal ROS: negative for - joint swelling or muscular weakness Neurological ROS: as noted in HPI Dermatological ROS: negative for rash and skin lesion changes  Physical exam: pleasant male in no apparent distress. BP 110/70 P 82 R 17 Afebril Head: normocephalic. Neck: supple, no bruits, no JVD. Cardiac: no murmurs. Lungs: clear. Abdomen: soft, no tender, no mass. Extremities: no edema. Neurologic Examination:                                                                                                      Mental Status: Alert, oriented, thought content appropriate.  Speech fluent without evidence of aphasia.  Able to follow 3 step commands without difficulty. Cranial Nerves: II: Discs flat bilaterally; Visual fields grossly normal,  pupils equal, round, reactive to light and accommodation III,IV, VI: ptosis not present, extra-ocular motions intact bilaterally V,VII: smile asymmetric due to mild left face numbness, facial light touch sensation normal bilaterally VIII: hearing normal bilaterally IX,X: gag reflex present XI: bilateral shoulder shrug XII: midline tongue extension without atrophy or fasciculations Motor: Right : Upper extremity   5/5    Left:     Upper extremity   5/5  Lower extremity   5/5     Lower extremity   5/5 Tone and bulk:normal tone throughout; no atrophy noted Sensory: Pinprick mildly diminished in the left side Deep Tendon Reflexes:  Right: Upper Extremity   Left: Upper extremity   biceps (C-5 to C-6) 2/4   biceps (C-5 to C-6) 2/4 tricep (C7) 2/4    triceps (C7) 2/4 Brachioradialis (C6) 2/4  Brachioradialis (C6) 2/4  Lower Extremity Lower Extremity  quadriceps (L-2 to L-4) 2/4   quadriceps (L-2 to L-4) 2/4 Achilles (S1) 2/4   Achilles (S1) 2/4  Plantars: Right: downgoing   Left: downgoing Cerebellar: normal finger-to-nose,  normal heel-to-shin test Gait:  No tested CV: pulses palpable throughout      Lab Results: Basic Metabolic Panel: No results found for this basename: NA, K, CL, CO2, GLUCOSE, BUN, CREATININE, CALCIUM, MG, PHOS,  in the last 168 hours  Liver Function Tests: No results found for this basename: AST, ALT, ALKPHOS, BILITOT, PROT, ALBUMIN,  in the  last 168 hours No results found for this basename: LIPASE, AMYLASE,  in the last 168 hours No results found for this basename: AMMONIA,  in the last 168 hours  CBC: No results found for this basename: WBC, NEUTROABS, HGB, HCT, MCV, PLT,  in the last 168 hours  Cardiac Enzymes: No results found for this basename: CKTOTAL, CKMB, CKMBINDEX, TROPONINI,  in the last 168 hours  Lipid Panel: No results found for this basename: CHOL, TRIG, HDL, CHOLHDL, VLDL, LDLCALC,  in the last 168 hours  CBG: No results found for  this basename: GLUCAP,  in the last 168 hours  Microbiology: No results found for this or any previous visit.  Coagulation Studies: No results found for this basename: LABPROT, INR,  in the last 72 hours  Imaging: No results found.   Assessment: 73 y.o. male with several risk factors for stroke, brought in after sustaining a fall at home and noted to be confused with left face weakness and speech changes. NIHSS 2, but out of the window for thrombolysis and CT brain with possible trace amount of bilateral parafalcine frontal regions SAH that is most likely post traumatic.Marland Kitchen Furthermore, there is a possible area of hypodensity within the left temporal lobe, suspicious for acute ischemia but this doesn't correspond anatomically with the reported symptoms of left face weakness and finding of decreased sensation left side. Will need to stop aspirin and plavix due to small area of probable bifrontal SAH. Complete stroke work up. Stroke team to follow up in am.  Stroke Risk Factors - diabetes mellitus, hyperlipidemia and hypertension  Dorian Pod, MD [facility] [phone]  07/02/2013, 5:10 PM

## 2013-07-03 ENCOUNTER — Inpatient Hospital Stay (HOSPITAL_COMMUNITY): Payer: MEDICARE

## 2013-07-03 DIAGNOSIS — F3289 Other specified depressive episodes: Secondary | ICD-10-CM

## 2013-07-03 DIAGNOSIS — F32A Depression, unspecified: Secondary | ICD-10-CM

## 2013-07-03 DIAGNOSIS — I609 Nontraumatic subarachnoid hemorrhage, unspecified: Secondary | ICD-10-CM | POA: Diagnosis present

## 2013-07-03 DIAGNOSIS — R55 Syncope and collapse: Secondary | ICD-10-CM

## 2013-07-03 DIAGNOSIS — J441 Chronic obstructive pulmonary disease with (acute) exacerbation: Secondary | ICD-10-CM

## 2013-07-03 DIAGNOSIS — F329 Major depressive disorder, single episode, unspecified: Secondary | ICD-10-CM

## 2013-07-03 LAB — LIPID PANEL
Cholesterol: 227 mg/dL — ABNORMAL HIGH (ref 0–200)
HDL: 37 mg/dL — ABNORMAL LOW (ref >39)
LDL Cholesterol: 119 mg/dL — ABNORMAL HIGH (ref 0–99)
Total CHOL/HDL Ratio: 6.1 RATIO
Triglycerides: 354 mg/dL — ABNORMAL HIGH (ref <150)
VLDL: 71 mg/dL — ABNORMAL HIGH (ref 0–40)

## 2013-07-03 LAB — GLUCOSE, CAPILLARY
Glucose-Capillary: 232 mg/dL — ABNORMAL HIGH (ref 70–99)
Glucose-Capillary: 213 mg/dL — ABNORMAL HIGH (ref 70–99)
Glucose-Capillary: 242 mg/dL — ABNORMAL HIGH (ref 70–99)

## 2013-07-03 LAB — HEMOGLOBIN A1C
Hgb A1c MFr Bld: 6.4 % — ABNORMAL HIGH (ref <5.7)
Mean Plasma Glucose: 137 mg/dL — ABNORMAL HIGH (ref <117)

## 2013-07-03 LAB — CK: Total CK: 77 U/L (ref 7–232)

## 2013-07-03 MED ORDER — IPRATROPIUM-ALBUTEROL 0.5-2.5 (3) MG/3ML IN SOLN: 3.0000 mL | RESPIRATORY_TRACT | Status: DC | PRN

## 2013-07-03 MED ORDER — ZOLPIDEM TARTRATE 5 MG PO TABS
5.0000 mg | ORAL_TABLET | Freq: Every evening | ORAL | Status: DC | PRN
Start: 2013-07-03 — End: 2013-07-06
  Administered 2013-07-03 – 2013-07-06 (×4): 5 mg via ORAL
  Filled 2013-07-03 (×4): qty 1

## 2013-07-03 MED ORDER — SODIUM CHLORIDE 0.9 % IV SOLN
INTRAVENOUS | Status: DC
  Administered 2013-07-03 – 2013-07-05 (×3): via INTRAVENOUS

## 2013-07-03 MED ORDER — IPRATROPIUM-ALBUTEROL 0.5-2.5 (3) MG/3ML IN SOLN
3.0000 mL | RESPIRATORY_TRACT | Status: DC
  Administered 2013-07-03: 3 mL via RESPIRATORY_TRACT
  Filled 2013-07-03: qty 3

## 2013-07-03 NOTE — Progress Notes (Signed)
Stroke Team Progress Note  HISTORY Chief Complaint: Code stroke  HPI:  Jesse Nunez is an 73 y.o. male with a past medical history significant for HTN, hyperlipidemia, DM, and macular degeneration, who was last seen normal by his wife this AM at 0900. She came home and found him with a left facial droop. Per wife he had fallen and then made his way to his couch. Wife told EMS that he patient told he probably fainted and afterwards was disoriented and little bit confused At time of arrival he was out of window for tPA and intervention. initial exam showed only left facial droop.  NIHSS 2  CT brain showed trace acute subarachnoid hemorrhage within the parafalcine bilateral frontal lobes without significant mass effect and age-indeterminate hypodensity within the left temporal lobe, suspicious for acute ischemia.  Patient is on aspirin 81 mg and he is not sure is he takes plavix.  Of importance, he reports chronic dizziness and frequent problems with low blood pressure.  Date last known well: Date: 07/02/2013  Time last known well: Time: 09:00  tPA Given: No: out of the window, possible small post traumatic SAH.  Patient was not administered TPA secondary to outside window. He was admitted to the Internal Medicine Service for further evaluation and treatment. Neurology is consulting.   SUBJECTIVE  No family at bedside, the patient feels well today, no issues with dizziness.  OBJECTIVE Most recent Vital Signs: Filed Vitals:   07/03/13 0200 07/03/13 0400 07/03/13 0600 07/03/13 0706  BP: 103/55 96/36 88/34  98/56  Pulse: 69   59  Temp:    98.5 F (36.9 C)  TempSrc:    Oral  Resp: 20   18  Height:      Weight:      SpO2: 92%   90%   CBG (last 3)   Recent Labs  07/02/13 2241 07/03/13 0754  GLUCAP 190* 232*    IV Fluid Intake:     MEDICATIONS  . buPROPion  400 mg Oral q morning - 10a  . colestipol  1 g Oral q morning - 10a  . ferrous sulfate  325 mg Oral Q breakfast  . insulin  aspart  0-9 Units Subcutaneous TID WC  . lisinopril  10 mg Oral Daily  . multivitamin with minerals  1 tablet Oral Daily  . pantoprazole  80 mg Oral Daily  . venlafaxine XR  300 mg Oral Q breakfast   PRN:  senna-docusate, zolpidem  Diet:  Carb Control thin liquids Activity:  Pending PT eval DVT Prophylaxis:    CLINICALLY SIGNIFICANT STUDIES Basic Metabolic Panel:  Recent Labs Lab 07/02/13 1703 07/02/13 1711 07/02/13 2100  NA 139 141 140  K 4.8 4.6 4.7  CL 101 106 102  CO2 20  --  22  GLUCOSE 134* 137* 123*  BUN 34* 33* 32*  CREATININE 1.62* 1.80* 1.55*  CALCIUM 9.4  --  9.7   Liver Function Tests:  Recent Labs Lab 07/02/13 1703 07/02/13 2100  AST 21 19  ALT 22 22  ALKPHOS 60 63  BILITOT 0.3 0.4  PROT 7.3 7.5  ALBUMIN 3.8 3.8   CBC:  Recent Labs Lab 07/02/13 1703 07/02/13 1711  WBC 10.6*  --   NEUTROABS 7.0  --   HGB 12.3* 13.3  HCT 36.6* 39.0  MCV 89.9  --   PLT 233  --    Coagulation:  Recent Labs Lab 07/02/13 1703  LABPROT 13.3  INR 1.03   Cardiac Enzymes: No  results found for this basename: CKTOTAL, CKMB, CKMBINDEX, TROPONINI,  in the last 168 hours Urinalysis:  Recent Labs Lab 07/02/13 [place] 1.030  PHURINE 5.0  GLUCOSEU NEGATIVE  HGBUR NEGATIVE  BILIRUBINUR SMALL*  KETONESUR 15*  PROTEINUR NEGATIVE  UROBILINOGEN 0.2  NITRITE NEGATIVE  LEUKOCYTESUR NEGATIVE   Lipid Panel    Component Value Date/Time   CHOL 227* 07/03/2013 0032   TRIG 354* 07/03/2013 0032   HDL 37* 07/03/2013 0032   CHOLHDL 6.1 07/03/2013 0032   VLDL 71* 07/03/2013 0032   LDLCALC 119* 07/03/2013 0032   HgbA1C  Lab Results  Component Value Date   HGBA1C 6.4* 07/02/2013    Urine Drug Screen:     Component Value Date/Time   LABOPIA NONE DETECTED 07/02/2013 1829   COCAINSCRNUR NONE DETECTED 07/02/2013 1829   LABBENZ NONE DETECTED 07/02/2013 1829   AMPHETMU POSITIVE* 07/02/2013 1829   THCU NONE DETECTED 07/02/2013 1829   LABBARB NONE  DETECTED 07/02/2013 1829    Alcohol Level:  Recent Labs Lab 07/02/13 1703  ETH <11    Dg Chest 2 View  07/02/2013   CLINICAL DATA:  Short of breath / and hypoxemia. Diabetes and hypertension.  EXAM: CHEST  2 VIEW  COMPARISON:  None.  FINDINGS: Mild hyperinflation. Artifact degradation posteriorly on the lateral view.  Midline trachea. Normal heart size and mediastinal contours for age. No pleural effusion or pneumothorax. Favor scarring at the lung bases, greater on the left. No lobar consolidation. Cannot exclude left upper lobe pulmonary nodule. On the order of 2.0 cm.  IMPRESSION: Hyperinflation, without acute superimposed process or other explanation for shortness of Breath.  Probable scarring at the lung bases, greater on the left. Comparison with prior radiographs would be informative.  Cannot exclude subtle left upper lobe pulmonary nodule. This alternatively could represent summation of osseous shadows. Given the smoking history, chest CT should be considered. If this is not performed, plain film radiographic followup at 3 months should be considered.   Electronically Signed   By: Abigail Miyamoto M.D.   On: 07/02/2013 19:14   Ct Head Wo Contrast  07/02/2013   CLINICAL DATA:  Stroke alert.  Fall.  EXAM: CT HEAD WITHOUT CONTRAST  TECHNIQUE: Contiguous axial images were obtained from the base of the skull through the vertex without intravenous contrast.  COMPARISON:  None.  FINDINGS: Diffuse prominence of the CSF containing spaces is compatible with generalized cerebral atrophy. Scattered and confluent hypodensity within the periventricular and deep white matter both cerebral hemispheres is most consistent with chronic small vessel ischemic disease.  There is trace hyperdensity within the medial left frontal lobe adjacent to the anterior falx (image 24), suspicious for acute subarachnoid hemorrhage. No significant mass effect. Additional hyperdense foci seen slightly more inferiorly within the  parasagittal right frontal lobe may represent small amount of blood as well (series 2, image 21).  No definite acute large vessel territory infarct identified. Wedge-shaped hypodensity within the left cerebellar hemisphere is most compatible with remote infarct. Remote lacunar infarct present within the left basal ganglia.  Prominent vascular calcifications present within the carotid siphons and distal vertebral arteries.  No mass lesion or midline shift. No hydrocephalus. No extra-axial fluid collection.  Air-fluid level present within the left maxillary sinus. Minimal opacity noted within the left sphenoid sinus. Mild mucoperiosteal thickening present within the frontal sinuses bilaterally.  Calvarium is intact. Scalp soft tissues within normal limits. No acute orbital abnormality.  IMPRESSION: 1. Trace acute subarachnoid  hemorrhage within the parafalcine bilateral frontal lobes without significant mass effect. Finding is likely posttraumatic related to fall. 2. Age-indeterminate hypodensity within the left temporal lobe, suspicious for acute ischemia. Further evaluation with brain MRI would likely be helpful for further evaluation. 3. 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 4. Generalized cerebral atrophy with advanced chronic microvascular ischemic disease. 5. Left maxillary sinusitis. Critical Value/emergent results were called by telephone at the time of interpretation on 07/02/2013 at 5:24 PM to Dr. Armida Sans , who verbally acknowledged these results.   Electronically Signed   By: Jeannine Boga M.D.   On: 07/02/2013 17:36   Ct Cervical Spine Wo Contrast  07/02/2013   CLINICAL DATA:  Stroke.  Possible trauma to  EXAM: CT CERVICAL SPINE WITHOUT CONTRAST  TECHNIQUE: Multidetector CT imaging of the cervical spine was performed without intravenous contrast. Multiplanar CT image reconstructions were also generated.  COMPARISON:  Head CT 07/02/2013.  FINDINGS: [place] cervical lymph nodes are  present. Cervical airway is patent. Thyroid is unremarkable. Pulmonary apices are clear.  Diffuse degenerative changes cervical spine. No evidence of fracture or dislocation. Noted however is 3.6 mm anterior subluxation of C2 on C3 and 5.4 mm anterior subluxation of C3 on C4. Although these changes could be degenerative ligamentous injury cannot be excluded.  IMPRESSION: 1. 3.6 mm anterior subluxation C2 on C3. 2. 5.4 mm anterior subluxation C3 on C4. Although these changes could be secondary to diffuse degenerative change present. Ligamentous injury with subluxation cannot be excluded. 3. No evidence of fracture or dislocation.   Electronically Signed   By: Marcello Moores  Register   On: 07/02/2013 19:17   Mr Jodene Nam Head Wo Contrast  07/02/2013   CLINICAL DATA:  Facial droop after a fall. Left facial weakness and speech difficulty.  EXAM: MRI HEAD WITHOUT CONTRAST  MRA HEAD WITHOUT CONTRAST  TECHNIQUE: Multiplanar, multiecho pulse sequences of the brain and surrounding structures were obtained without intravenous contrast. Angiographic images of the head were obtained using MRA technique without contrast.  COMPARISON:  CT head earlier in the day.  FINDINGS: MRI HEAD FINDINGS  No acute stroke. No mass lesion, hydrocephalus, or extra-axial fluid.  Generalized atrophy. Chronic microvascular ischemic change of a moderate degree. Tiny focus of chronic hemorrhage left parietal cortex. Scattered the [place] throughout the basal ganglia and deep white matter. Dolichoectatic cerebral vasculature with wide patency; right vertebral is either atretic or ends in PICA. 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  There is evidence for acute subarachnoid hemorrhage in the anterior interhemispheric fissure as suspected from CT. Left sylvian fissure subarachnoid blood not definitely present but could be difficult to detect due to the axial plane of FLAIR imaging. No intraventricular hemorrhage or pre pontine hemorrhage. No midline shift. No osseous  findings. Air-fluid level left maxillary sinus felt inflammatory, not posttraumatic. No mastoid fluid. No midline abnormality. No upper cervical lesions. Midline posterior scalp hematoma.  MRA HEAD FINDINGS  The internal carotid arteries are dolichoectatic but widely patent. The basilar artery is solely flow supplied by the left vertebral. These both are widely patent. No proximal stenosis of the anterior, middle, or posterior cerebral arteries. No cerebellar branch occlusion. No intracranial aneurysm.  IMPRESSION: Posttraumatic subarachnoid hemorrhage in the anterior interhemispheric fissure. No parenchymal hemorrhage is evident other than a tiny focus of chronic hemorrhage left parietal cortex.  Atrophy and small vessel disease.  No vascular occlusion or significant stenosis.   Electronically Signed   By: Rolla Flatten M.D.   On: 07/02/2013 20:12  Mr Brain Wo Contrast  07/02/2013   CLINICAL DATA:  Facial droop after a fall. Left facial weakness and speech difficulty.  EXAM: MRI HEAD WITHOUT CONTRAST  MRA HEAD WITHOUT CONTRAST  TECHNIQUE: Multiplanar, multiecho pulse sequences of the brain and surrounding structures were obtained without intravenous contrast. Angiographic images of the head were obtained using MRA technique without contrast.  COMPARISON:  CT head earlier in the day.  FINDINGS: MRI HEAD FINDINGS  No acute stroke. No mass lesion, hydrocephalus, or extra-axial fluid.  Generalized atrophy. Chronic microvascular ischemic change of a moderate degree. Tiny focus of chronic hemorrhage left parietal cortex. Scattered the [place] throughout the basal ganglia and deep white matter. Dolichoectatic cerebral vasculature with wide patency; right vertebral is either atretic or ends in PICA. 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  There is evidence for acute subarachnoid hemorrhage in the anterior interhemispheric fissure as suspected from CT. Left sylvian fissure subarachnoid blood not definitely present but could be  difficult to detect due to the axial plane of FLAIR imaging. No intraventricular hemorrhage or pre pontine hemorrhage. No midline shift. No osseous findings. Air-fluid level left maxillary sinus felt inflammatory, not posttraumatic. No mastoid fluid. No midline abnormality. No upper cervical lesions. Midline posterior scalp hematoma.  MRA HEAD FINDINGS  The internal carotid arteries are dolichoectatic but widely patent. The basilar artery is solely flow supplied by the left vertebral. These both are widely patent. No proximal stenosis of the anterior, middle, or posterior cerebral arteries. No cerebellar branch occlusion. No intracranial aneurysm.  IMPRESSION: Posttraumatic subarachnoid hemorrhage in the anterior interhemispheric fissure. No parenchymal hemorrhage is evident other than a tiny focus of chronic hemorrhage left parietal cortex.  Atrophy and small vessel disease.  No vascular occlusion or significant stenosis.   Electronically Signed   By: Rolla Flatten M.D.   On: 07/02/2013 20:12   Mr Cervical Spine Wo Contrast  07/02/2013   CLINICAL DATA:  Status post fall. Abnormal CT cervical spine. Neck pain.  EXAM: MRI CERVICAL SPINE WITHOUT CONTRAST  TECHNIQUE: Multiplanar, multisequence MR imaging of the cervical spine was performed. No intravenous contrast was administered.  COMPARISON:  CT cervical spine 07/02/2013.  FINDINGS: Minor anterolisthesis at C3-4 has reduced somewhat compared to the CT, with anatomic alignment at C2-3, C4-5, C5-6, C6-7, and C7-T1 on MR. as seen on MR, anterolisthesis at C3-4 measures no more than 2 mm.  No vertebral body compression deformity, prevertebral soft tissue swelling, ligamentous injury, cord contusion, hematomyelia, or neck mass.  Chronically diseased or hypoplastic/atretic right vertebral. Craniocervical junction unremarkable.  The individual disc spaces were examined as follows:  C2-3:  Unremarkable disc space.  Bilateral facet arthropathy.  C3-4: Central protrusion  severe facet arthropathy on the left with uncinate spurring. Left C4 nerve root impingement is evident.  C4-5:  Central protrusion.  Mild canal stenosis.  No impingement.  C5-6: Moderate disc space narrowing. Central protrusion with bilateral uncinate spurring. Mild central canal stenosis. Bilateral C6 nerve root impingement.  C6-7: Moderate disc space narrowing. Central protrusion with left greater than right uncinate spurring. Bilateral C7 nerve root impingement, worse on the left.  C7-T1:  Unremarkable.  IMPRESSION: Multilevel spondylosis. No cervical spine fracture or traumatic subluxation. No epidural hematoma or cord contusion.  The observed CT findings at C2-3, and C3-4 appear to be related to facet disease. There is no evidence for ligamentous injury or prevertebral soft tissue swelling.   Electronically Signed   By: Rolla Flatten M.D.   On: 07/02/2013 20:35  CT of the brain   IMPRESSION:  1. Trace acute subarachnoid hemorrhage within the parafalcine  bilateral frontal lobes without significant mass effect. Finding is  likely posttraumatic related to fall.  2. Age-indeterminate hypodensity within the left temporal lobe,  suspicious for acute ischemia. Further evaluation with brain MRI  would likely be helpful for further evaluation.  3. 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  4. Generalized cerebral atrophy with advanced chronic microvascular  ischemic disease.  5. Left maxillary sinusitis.  MRI of the brain   IMPRESSION:  Posttraumatic subarachnoid hemorrhage in the anterior  interhemispheric fissure. No parenchymal hemorrhage is evident other  than a tiny focus of chronic hemorrhage left parietal cortex.  Atrophy and small vessel disease.  No vascular occlusion or significant stenosis.  MRA of the brain   See above  2D Echocardiogram    Carotid Doppler    CXR   IMPRESSION:  Hyperinflation, without acute superimposed process or other  explanation for  shortness of Breath.  Probable scarring at the lung bases, greater on the left. Comparison  with prior radiographs would be informative.  Cannot exclude subtle left upper lobe pulmonary nodule. This  alternatively could represent summation of osseous shadows. Given  the smoking history, chest CT should be considered. If this is not  performed, plain film radiographic followup at 3 months should be  considered.  EKG  Sinus rhythm Prolonged PR interval  Therapy Recommendations  pending   Physical Exam  General: The patient is alert and cooperative at the time of the examination.  Skin: No significant peripheral edema is noted.   Neurologic Exam  Mental status: The patient is oriented x 3.  Cranial nerves: Facial symmetry is present. Speech is normal, no aphasia or dysarthria is noted. Extraocular movements are full. Visual fields are full.  Motor: The patient has good strength in all 4 extremities.  Sensory examination: Soft touch sensation is symmetric on the face, arms, and legs.  Coordination: The patient has good finger-nose-finger and heel-to-shin bilaterally.  Gait and station: The patient has a normal gait. Tandem gait is normal. Romberg is negative. No drift is seen.  Reflexes: Deep tendon reflexes are symmetric.   ASSESSMENT Mr. Shed Nixon is a 73 y.o. male Stroke work up underway. The patient was admitted following an apparent syncopal event at home. The patient gives a several month history of increasing problems with dizziness with standing. He has eliminated Norvasc as one of his blood pressure medications as he has had documented hypotensive events associated with wearing of vision and mental confusion. The patient reports frequent episodes of dizziness with standing.   Syncope on admission  Hypertension  Diabetes  Dyslipidemia  History of depression   Hospital day # 1  Primary event on this admission was not a TIA, but rather likely a syncopal  event associated with orthostatic hypotension. The patient has had a several month history of increasing problems with dizziness while standing, with documented episodes of hypotension associated with blurring of vision and mental confusion. He denies palpitations of the heart. He recently discontinued Norvasc because of the hypotensive events.  TREATMENT/PLAN  Carotid Doppler study  2-D echocardiogram  EEG evaluation  Will need orthostatic blood pressure checks off of IV fluids  LDL 119, use of statin drugs  Physical and occupational therapy evaluation  Aspirin therapy in the future, currently held secondary to head trauma and small subarachnoid bleeding  Cardiac monitoring  SIGNED Lenor Coffin   I have personally  obtained a history, examined the patient, evaluated imaging results, and formulated the assessment and plan of care. I agree with the above.    To contact Stroke Continuity provider, please refer to [email or web address] After hours, contact General Neurology

## 2013-07-03 NOTE — Evaluation (Signed)
Physical Therapy Evaluation Patient Details Name: Jesse Nunez MRN: [id number] DOB: February 14, 1940 Today's Date: 07/03/2013   History of Present Illness  Patient is an 73 y.o. male with a past medical history significant for HTN, hyperlipidemia, DM, and macular degeneration, who was last seen normal by his sister this AM at 0900 6/22. She came home and found him with a left facial droop. Per wife he had fallen and then made his way to his couch. Wife told EMS that he patient told he probably fainted and afterwards was disoriented and little bit confused. CT brain showed trace acute subarachnoid hemorrhage.  Clinical Impression  Pt with no syncopal episodes during PT session this date with the exception of onset of dizziness when leaning forward to change socks. Pt with onset of dizziness when bending over and returning to upright position. Suspect possible vestibular dysfunction if all diagnostic testing comes back negative. Pt with no nystagmus onset with rolling L/R in trendelenburg position (limited by c-collar). Pt with normal smooth pursuit and saccades. No spontaneous nystagmus. Pt compensating well with moving slowly during position changes due to onset of dizziness. Pt currently functioning at supervision level. Pt reports he can stay with his sister for a few days but would like to return home alone eventually.    Follow Up Recommendations Outpatient PT;Supervision - Intermittent (for possible vestibular dysfunction)    Equipment Recommendations  None recommended by PT    Recommendations for Other Services       Precautions / Restrictions Precautions Precautions: Fall Restrictions Weight Bearing Restrictions: No      Mobility  Bed Mobility Overal bed mobility: Modified Independent             General bed mobility comments: no sense of dizziness, increased time, use of bed rail  Transfers Overall transfer level: Needs assistance Equipment used: None Transfers: Sit to/from  Stand Sit to Stand: Supervision         General transfer comment: pt with good technique, slow to transition due to onset of  dizzines with anterior transition forward  Ambulation/Gait Ambulation/Gait assistance: Min guard Ambulation Distance (Feet): 200 Feet Assistive device: None Gait Pattern/deviations: WFL(Within Functional Limits) Gait velocity: wfl   General Gait Details: pt with no episodes of LOB or onset of dizziness  Stairs            Wheelchair Mobility    Modified Rankin (Stroke Patients Only)       Balance                                             Pertinent Vitals/Pain Denies pain, vitals remained stable during PT eval    Home Living Family/patient expects to be discharged to:: Private residence (pt lives alone in [place], was visiting sister) Living Arrangements: Other relatives (sister) Available Help at Discharge: Family;Available PRN/intermittently Type of Home: Apartment Home Access: Level entry     Home Layout: One level (sisters house is 2 story, he sleeps upstairs) Home Equipment: None      Prior Function Level of Independence: Independent               Hand Dominance   Dominant Hand: Right    Extremity/Trunk Assessment   Upper Extremity Assessment: Overall WFL for tasks assessed           Lower Extremity Assessment: Overall WFL for tasks assessed  Cervical / Trunk Assessment: Normal (in aspen collar)  Communication   Communication: No difficulties  Cognition Arousal/Alertness: Awake/alert Behavior During Therapy: WFL for tasks assessed/performed Overall Cognitive Status: Within Functional Limits for tasks assessed                      General Comments      Exercises        Assessment/Plan    PT Assessment Patient needs continued PT services  PT Diagnosis     PT Problem List Decreased strength;Decreased activity tolerance;Decreased balance;Decreased mobility  PT  Treatment Interventions Gait training;Stair training;Functional mobility training;Therapeutic activities;Therapeutic exercise;Balance training (vestibular assessment)   PT Goals (Current goals can be found in the Care Plan section) Acute Rehab PT Goals Patient Stated Goal: to figure out what happened PT Goal Formulation: With patient Time For Goal Achievement: 07/10/13 Potential to Achieve Goals: Good    Frequency Min 3X/week   Barriers to discharge Decreased caregiver support pt lives alone    Co-evaluation               End of Session Equipment Utilized During Treatment: Gait belt Activity Tolerance: Patient tolerated treatment well Patient left: in chair;with call bell/phone within reach Nurse Communication: Mobility status         Time: 7939-0300 PT Time Calculation (min): 30 min   Charges:   PT Evaluation $Initial PT Evaluation Tier I: 1 Procedure PT Treatments $Gait Training: 8-22 mins   PT G CodesKingsley Callander 07/03/2013, 12:43 PM  Kittie Plater, PT, DPT Pager #: [phone] Office #: [phone]

## 2013-07-03 NOTE — Progress Notes (Signed)
07/03/13 1445 07/03/13 1448 07/03/13 1451  Vitals  Temp 98.1 F (36.7 C) --  --   Temp src Oral --  --   BP ! 104/58 mmHg 111/61 mmHg 116/61 mmHg  BP Location Left arm Left arm Left arm  BP Method Automatic Automatic Automatic  Patient Position (if appropriate) Lying Sitting Standing  Pulse Rate 73 81 82  Pulse Rate Source Dinamap Dinamap Dinamap  orthostatic vitals charted per MD order. No c/o dizziness upon standing Zenia Resides, [place] Marie, RN

## 2013-07-03 NOTE — Evaluation (Signed)
Speech Language Pathology Evaluation Patient Details Name: Jesse Nunez MRN: [id number] DOB: Apr 24, 1940 Today's Date: 07/03/2013 Time: 1218-1229 SLP Time Calculation (min): 11 min  Problem List:  Patient Active Problem List   Diagnosis Date Noted  . Subarachnoid hemorrhage 07/03/2013  . Depression 07/03/2013  . COPD exacerbation 07/03/2013  . Syncope 07/02/2013   Past Medical History:  Past Medical History  Diagnosis Date  . Hypertension   . Diabetes   . Hyperlipidemia   . Depression    Past Surgical History:  Past Surgical History  Procedure Laterality Date  . Tonsillectomy    . Appendectomy     HPI:  Mr. Jesse Nunez is a 73 y.o. male Stroke work up underway. The patient was admitted following an apparent syncopal event at home. The patient gives a several month history of increasing problems with dizziness with standing.  He has had documented hypotensive events associated with wearing of vision and mental confusion. The patient reports frequent episodes of dizziness with standing. Pt sustained head trauma and small subarachnoid bleeding. MRI shows no stroke, posttraumatic subarachnoid hemorrhage in the anterior interhemispheric fissure.   Assessment / Plan / Recommendation Clinical Impression  Pt demonstrates adequate cognitive lingusitic function. No SLP f/u needed. Will sign off.     SLP Assessment  Patient does not need any further Speech Lanaguage Pathology Services    Follow Up Recommendations       Frequency and Duration        Pertinent Vitals/Pain NA   SLP Goals     SLP Evaluation Prior Functioning  Cognitive/Linguistic Baseline: Within functional limits Type of Home: Apartment  Lives With: Alone Available Help at Discharge: Family;Available PRN/intermittently Vocation: Retired   Associate Professor  Overall Cognitive Status: Within Functional Limits for tasks assessed Orientation Level: Oriented X4    Comprehension  Auditory Comprehension Overall  Auditory Comprehension: Appears within functional limits for tasks assessed    Expression Verbal Expression Overall Verbal Expression: Appears within functional limits for tasks assessed   Oral / Motor Oral Motor/Sensory Function Overall Oral Motor/Sensory Function: Appears within functional limits for tasks assessed Motor Speech Overall Motor Speech: Appears within functional limits for tasks assessed   GO     Buelah Rennie, Katherene Ponto 07/03/2013, 1:13 PM

## 2013-07-03 NOTE — Progress Notes (Signed)
PROGRESS NOTE  Hue Frick GEX:[id number] DOB: 09-11-1940 DOA: 07/02/2013 PCP: PROVIDER NOT IN SYSTEM  Assessment/Plan: Fall with left sided facial droop, ?syncope?:  -stroke workup including MRI/MRA:Posttraumatic subarachnoid hemorrhage in the anterior interhemispheric fissure. No parenchymal hemorrhage is evident other than a tiny focus of chronic hemorrhage left parietal cortex 2-D echo pending carotid Dopplers -Hold antiplatelet treatment due to subarachnoid hemm -Appreciate neurology input.   Subarachnoid hemorrhage: Case initially discussed with neurosurgery by ER physician per her report. Likely posttraumatic. Currently immobilized in neck brace- will d/c as MRI ok   Hypertension: holding home meds  Diabetes: Sliding-scale insulin. A1c   AKI: Stage III on presentation. Unclear if this is baseline. Suspect chronic issue in the setting of hypertension and diabetes. IVF, recheck in AM  Hyperlipidemia: Lipid panel.  CT scan of the C-spine show some subluxation of C2 on C3 and C3 on C4.  - c-collar and MRI: Multilevel spondylosis. No cervical spine fracture or traumatic  subluxation. No epidural hematoma or cord contusion. The observed CT findings at C2-3, and C3-4 appear to be related to facet disease. There is no evidence for ligamentous injury or prevertebral soft tissue swelling.  COPD- duonebs, not currently wheezing   Reviewed records from [facility]- patient treated for bronchitis recently.  No old Cr in the system  Code Status: DNR?  Family Communication: patient Disposition Plan:    Consultants:  Neurosurgery- on phone in ER  Procedures:      HPI/Subjective: Unsure of what happened to him  Objective: Filed Vitals:   07/03/13 0817  BP: 115/60  Pulse: 66  Temp:   Resp: 18   No intake or output data in the 24 hours ending 07/03/13 1009 Filed Weights   07/02/13 2230  Weight: 84.2 kg (185 lb 10 oz)    Exam:   General:  A+Ox3, NAD  Cardiovascular:  rrr  Respiratory: decreased b/l, no wheezing  Abdomen: +BS, soft  Musculoskeletal: moves all 4 ext  Data Reviewed: Basic Metabolic Panel:  Recent Labs Lab 07/02/13 1703 07/02/13 1711 07/02/13 2100  NA 139 141 140  K 4.8 4.6 4.7  CL 101 106 102  CO2 20  --  22  GLUCOSE 134* 137* 123*  BUN 34* 33* 32*  CREATININE 1.62* 1.80* 1.55*  CALCIUM 9.4  --  9.7   Liver Function Tests:  Recent Labs Lab 07/02/13 1703 07/02/13 2100  AST 21 19  ALT 22 22  ALKPHOS 60 63  BILITOT 0.3 0.4  PROT 7.3 7.5  ALBUMIN 3.8 3.8   No results found for this basename: LIPASE, AMYLASE,  in the last 168 hours No results found for this basename: AMMONIA,  in the last 168 hours CBC:  Recent Labs Lab 07/02/13 1703 07/02/13 1711  WBC 10.6*  --   NEUTROABS 7.0  --   HGB 12.3* 13.3  HCT 36.6* 39.0  MCV 89.9  --   PLT 233  --    Cardiac Enzymes: No results found for this basename: CKTOTAL, CKMB, CKMBINDEX, TROPONINI,  in the last 168 hours BNP (last 3 results) No results found for this basename: PROBNP,  in the last 8760 hours CBG:  Recent Labs Lab 07/02/13 2241 07/03/13 0754  GLUCAP 190* 232*    No results found for this or any previous visit (from the past 240 hour(s)).   Studies: Dg Chest 2 View  07/02/2013   CLINICAL DATA:  Short of breath / and hypoxemia. Diabetes and hypertension.  EXAM: CHEST  2 VIEW  COMPARISON:  None.  FINDINGS: Mild hyperinflation. Artifact degradation posteriorly on the lateral view.  Midline trachea. Normal heart size and mediastinal contours for age. No pleural effusion or pneumothorax. Favor scarring at the lung bases, greater on the left. No lobar consolidation. Cannot exclude left upper lobe pulmonary nodule. On the order of 2.0 cm.  IMPRESSION: Hyperinflation, without acute superimposed process or other explanation for shortness of Breath.  Probable scarring at the lung bases, greater on the left. Comparison with prior radiographs would be informative.   Cannot exclude subtle left upper lobe pulmonary nodule. This alternatively could represent summation of osseous shadows. Given the smoking history, chest CT should be considered. If this is not performed, plain film radiographic followup at 3 months should be considered.   Electronically Signed   By: Abigail Miyamoto M.D.   On: 07/02/2013 19:14   Ct Head Wo Contrast  07/02/2013   CLINICAL DATA:  Stroke alert.  Fall.  EXAM: CT HEAD WITHOUT CONTRAST  TECHNIQUE: Contiguous axial images were obtained from the base of the skull through the vertex without intravenous contrast.  COMPARISON:  None.  FINDINGS: Diffuse prominence of the CSF containing spaces is compatible with generalized cerebral atrophy. Scattered and confluent hypodensity within the periventricular and deep white matter both cerebral hemispheres is most consistent with chronic small vessel ischemic disease.  There is trace hyperdensity within the medial left frontal lobe adjacent to the anterior falx (image 24), suspicious for acute subarachnoid hemorrhage. No significant mass effect. Additional hyperdense foci seen slightly more inferiorly within the parasagittal right frontal lobe may represent small amount of blood as well (series 2, image 21).  No definite acute large vessel territory infarct identified. Wedge-shaped hypodensity within the left cerebellar hemisphere is most compatible with remote infarct. Remote lacunar infarct present within the left basal ganglia.  Prominent vascular calcifications present within the carotid siphons and distal vertebral arteries.  No mass lesion or midline shift. No hydrocephalus. No extra-axial fluid collection.  Air-fluid level present within the left maxillary sinus. Minimal opacity noted within the left sphenoid sinus. Mild mucoperiosteal thickening present within the frontal sinuses bilaterally.  Calvarium is intact. Scalp soft tissues within normal limits. No acute orbital abnormality.  IMPRESSION: 1. Trace  acute subarachnoid hemorrhage within the parafalcine bilateral frontal lobes without significant mass effect. Finding is likely posttraumatic related to fall. 2. Age-indeterminate hypodensity within the left temporal lobe, suspicious for acute ischemia. Further evaluation with brain MRI would likely be helpful for further evaluation. 3. 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 4. Generalized cerebral atrophy with advanced chronic microvascular ischemic disease. 5. Left maxillary sinusitis. Critical Value/emergent results were called by telephone at the time of interpretation on 07/02/2013 at 5:24 PM to Dr. Armida Sans , who verbally acknowledged these results.   Electronically Signed   By: Jeannine Boga M.D.   On: 07/02/2013 17:36   Ct Cervical Spine Wo Contrast  07/02/2013   CLINICAL DATA:  Stroke.  Possible trauma to  EXAM: CT CERVICAL SPINE WITHOUT CONTRAST  TECHNIQUE: Multidetector CT imaging of the cervical spine was performed without intravenous contrast. Multiplanar CT image reconstructions were also generated.  COMPARISON:  Head CT 07/02/2013.  FINDINGS: Shotty cervical lymph nodes are present. Cervical airway is patent. Thyroid is unremarkable. Pulmonary apices are clear.  Diffuse degenerative changes cervical spine. No evidence of fracture or dislocation. Noted however is 3.6 mm anterior subluxation of C2 on C3 and 5.4 mm anterior subluxation of C3 on C4. Although these changes  could be degenerative ligamentous injury cannot be excluded.  IMPRESSION: 1. 3.6 mm anterior subluxation C2 on C3. 2. 5.4 mm anterior subluxation C3 on C4. Although these changes could be secondary to diffuse degenerative change present. Ligamentous injury with subluxation cannot be excluded. 3. No evidence of fracture or dislocation.   Electronically Signed   By: Marcello Moores  Register   On: 07/02/2013 19:17   Mr Jodene Nam Head Wo Contrast  07/02/2013   CLINICAL DATA:  Facial droop after a fall. Left facial weakness and  speech difficulty.  EXAM: MRI HEAD WITHOUT CONTRAST  MRA HEAD WITHOUT CONTRAST  TECHNIQUE: Multiplanar, multiecho pulse sequences of the brain and surrounding structures were obtained without intravenous contrast. Angiographic images of the head were obtained using MRA technique without contrast.  COMPARISON:  CT head earlier in the day.  FINDINGS: MRI HEAD FINDINGS  No acute stroke. No mass lesion, hydrocephalus, or extra-axial fluid.  Generalized atrophy. Chronic microvascular ischemic change of a moderate degree. Tiny focus of chronic hemorrhage left parietal cortex. Scattered the [place] throughout the basal ganglia and deep white matter. Dolichoectatic cerebral vasculature with wide patency; right vertebral is either atretic or ends in PICA. 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  There is evidence for acute subarachnoid hemorrhage in the anterior interhemispheric fissure as suspected from CT. Left sylvian fissure subarachnoid blood not definitely present but could be difficult to detect due to the axial plane of FLAIR imaging. No intraventricular hemorrhage or pre pontine hemorrhage. No midline shift. No osseous findings. Air-fluid level left maxillary sinus felt inflammatory, not posttraumatic. No mastoid fluid. No midline abnormality. No upper cervical lesions. Midline posterior scalp hematoma.  MRA HEAD FINDINGS  The internal carotid arteries are dolichoectatic but widely patent. The basilar artery is solely flow supplied by the left vertebral. These both are widely patent. No proximal stenosis of the anterior, middle, or posterior cerebral arteries. No cerebellar branch occlusion. No intracranial aneurysm.  IMPRESSION: Posttraumatic subarachnoid hemorrhage in the anterior interhemispheric fissure. No parenchymal hemorrhage is evident other than a tiny focus of chronic hemorrhage left parietal cortex.  Atrophy and small vessel disease.  No vascular occlusion or significant stenosis.   Electronically Signed   By:  Rolla Flatten M.D.   On: 07/02/2013 20:12   Mr Brain Wo Contrast  07/02/2013   CLINICAL DATA:  Facial droop after a fall. Left facial weakness and speech difficulty.  EXAM: MRI HEAD WITHOUT CONTRAST  MRA HEAD WITHOUT CONTRAST  TECHNIQUE: Multiplanar, multiecho pulse sequences of the brain and surrounding structures were obtained without intravenous contrast. Angiographic images of the head were obtained using MRA technique without contrast.  COMPARISON:  CT head earlier in the day.  FINDINGS: MRI HEAD FINDINGS  No acute stroke. No mass lesion, hydrocephalus, or extra-axial fluid.  Generalized atrophy. Chronic microvascular ischemic change of a moderate degree. Tiny focus of chronic hemorrhage left parietal cortex. Scattered the [place] throughout the basal ganglia and deep white matter. Dolichoectatic cerebral vasculature with wide patency; right vertebral is either atretic or ends in PICA. 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  There is evidence for acute subarachnoid hemorrhage in the anterior interhemispheric fissure as suspected from CT. Left sylvian fissure subarachnoid blood not definitely present but could be difficult to detect due to the axial plane of FLAIR imaging. No intraventricular hemorrhage or pre pontine hemorrhage. No midline shift. No osseous findings. Air-fluid level left maxillary sinus felt inflammatory, not posttraumatic. No mastoid fluid. No midline abnormality. No upper cervical lesions. Midline posterior scalp hematoma.  MRA  HEAD FINDINGS  The internal carotid arteries are dolichoectatic but widely patent. The basilar artery is solely flow supplied by the left vertebral. These both are widely patent. No proximal stenosis of the anterior, middle, or posterior cerebral arteries. No cerebellar branch occlusion. No intracranial aneurysm.  IMPRESSION: Posttraumatic subarachnoid hemorrhage in the anterior interhemispheric fissure. No parenchymal hemorrhage is evident other than a tiny focus of  chronic hemorrhage left parietal cortex.  Atrophy and small vessel disease.  No vascular occlusion or significant stenosis.   Electronically Signed   By: Rolla Flatten M.D.   On: 07/02/2013 20:12   Mr Cervical Spine Wo Contrast  07/02/2013   CLINICAL DATA:  Status post fall. Abnormal CT cervical spine. Neck pain.  EXAM: MRI CERVICAL SPINE WITHOUT CONTRAST  TECHNIQUE: Multiplanar, multisequence MR imaging of the cervical spine was performed. No intravenous contrast was administered.  COMPARISON:  CT cervical spine 07/02/2013.  FINDINGS: Minor anterolisthesis at C3-4 has reduced somewhat compared to the CT, with anatomic alignment at C2-3, C4-5, C5-6, C6-7, and C7-T1 on MR. as seen on MR, anterolisthesis at C3-4 measures no more than 2 mm.  No vertebral body compression deformity, prevertebral soft tissue swelling, ligamentous injury, cord contusion, hematomyelia, or neck mass.  Chronically diseased or hypoplastic/atretic right vertebral. Craniocervical junction unremarkable.  The individual disc spaces were examined as follows:  C2-3:  Unremarkable disc space.  Bilateral facet arthropathy.  C3-4: Central protrusion severe facet arthropathy on the left with uncinate spurring. Left C4 nerve root impingement is evident.  C4-5:  Central protrusion.  Mild canal stenosis.  No impingement.  C5-6: Moderate disc space narrowing. Central protrusion with bilateral uncinate spurring. Mild central canal stenosis. Bilateral C6 nerve root impingement.  C6-7: Moderate disc space narrowing. Central protrusion with left greater than right uncinate spurring. Bilateral C7 nerve root impingement, worse on the left.  C7-T1:  Unremarkable.  IMPRESSION: Multilevel spondylosis. No cervical spine fracture or traumatic subluxation. No epidural hematoma or cord contusion.  The observed CT findings at C2-3, and C3-4 appear to be related to facet disease. There is no evidence for ligamentous injury or prevertebral soft tissue swelling.    Electronically Signed   By: Rolla Flatten M.D.   On: 07/02/2013 20:35    Scheduled Meds: . buPROPion  400 mg Oral q morning - 10a  . colestipol  1 g Oral q morning - 10a  . ferrous sulfate  325 mg Oral Q breakfast  . insulin aspart  0-9 Units Subcutaneous TID WC  . multivitamin with minerals  1 tablet Oral Daily  . pantoprazole  80 mg Oral Daily  . venlafaxine XR  300 mg Oral Q breakfast   Continuous Infusions: . sodium chloride     Antibiotics Given (last 72 hours)   None      Active Problems:   Syncope   Subarachnoid hemorrhage   Depression   COPD exacerbation    Time spent: 35 min    Tiras Bianchini  [facility] Pager [phone]. If 7PM-7AM, please contact night-coverage at [email or web address], password [facility] 07/03/2013, 10:09 AM  LOS: 1 day

## 2013-07-03 NOTE — Progress Notes (Signed)
EEG Completed; Results Pending  

## 2013-07-03 NOTE — Progress Notes (Signed)
CARE MANAGEMENT NOTE 07/03/2013  Patient:  Jesse Nunez,Jesse Nunez   Account Number:  [id number]  Date Initiated:  07/03/2013  Documentation initiated by:  Olga Coaster  Subjective/Objective Assessment:   ADMITTED WITH [facility]     Action/Plan:   CM FOLLOWING FOR DCP   Anticipated DC Date:  07/07/2013   Anticipated DC Plan:  AWAITING FOR PT/OT EVALUATION FOR DISPOSITION NEEDS     DC Planning Services  CM consult          Status of service:  In process, will continue to follow Medicare Important Message given?   (If response is "NO", the following Medicare IM given date fields will be blank)  Per UR Regulation:  Reviewed for med. necessity/level of care/duration of stay  Comments:  6/23/2015Mindi Slicker RN,BSN,MHA [phone]

## 2013-07-04 ENCOUNTER — Inpatient Hospital Stay (HOSPITAL_COMMUNITY): Payer: MEDICARE

## 2013-07-04 DIAGNOSIS — D72829 Elevated white blood cell count, unspecified: Secondary | ICD-10-CM

## 2013-07-04 DIAGNOSIS — I359 Nonrheumatic aortic valve disorder, unspecified: Secondary | ICD-10-CM

## 2013-07-04 LAB — CBC
HCT: 37.7 % — ABNORMAL LOW (ref 39.0–52.0)
Hemoglobin: 12.6 g/dL — ABNORMAL LOW (ref 13.0–17.0)
MCH: 29.8 pg (ref 26.0–34.0)
MCHC: 33.4 g/dL (ref 30.0–36.0)
MCV: 89.1 fL (ref 78.0–100.0)
Platelets: 253 10*3/uL (ref 150–400)
RBC: 4.23 MIL/uL (ref 4.22–5.81)
RDW: 13.3 % (ref 11.5–15.5)
WBC: 18.5 10*3/uL — ABNORMAL HIGH (ref 4.0–10.5)

## 2013-07-04 LAB — GLUCOSE, CAPILLARY
Glucose-Capillary: 141 mg/dL — ABNORMAL HIGH (ref 70–99)
Glucose-Capillary: 168 mg/dL — ABNORMAL HIGH (ref 70–99)
Glucose-Capillary: 106 mg/dL — ABNORMAL HIGH (ref 70–99)

## 2013-07-04 LAB — BASIC METABOLIC PANEL
BUN: 36 mg/dL — ABNORMAL HIGH (ref 6–23)
CO2: 20 mEq/L (ref 19–32)
Calcium: 9.8 mg/dL (ref 8.4–10.5)
Chloride: 103 mEq/L (ref 96–112)
Creatinine, Ser: 1.54 mg/dL — ABNORMAL HIGH (ref 0.50–1.35)
GFR calc Af Amer: 50 mL/min — ABNORMAL LOW (ref >90)
GFR calc non Af Amer: 43 mL/min — ABNORMAL LOW (ref >90)
Glucose, Bld: 142 mg/dL — ABNORMAL HIGH (ref 70–99)
Potassium: 5 mEq/L (ref 3.7–5.3)
Sodium: 140 mEq/L (ref 137–147)

## 2013-07-04 MED ORDER — ONDANSETRON HCL 4 MG/2ML IJ SOLN
4.0000 mg | Freq: Four times a day (QID) | INTRAMUSCULAR | Status: DC | PRN
  Filled 2013-07-04: qty 2

## 2013-07-04 MED ORDER — LEVOFLOXACIN 750 MG PO TABS
750.0000 mg | ORAL_TABLET | ORAL | Status: DC
  Administered 2013-07-04 – 2013-07-06 (×2): 750 mg via ORAL
  Filled 2013-07-04 (×2): qty 1

## 2013-07-04 NOTE — Progress Notes (Signed)
[facility] PROGRESS NOTE  Jesse Nunez EHM:[id number] DOB: Aug 27, 1940 DOA: 07/02/2013 PCP: PROVIDER NOT IN SYSTEM  Assessment/Plan: 73 y.o. year old male with PMH of copd, hypertension, hyperlipidemia, type 2 diabetes, chronic intermittent dizziness, presenting with syncope, dizziness,? CVA, subarachnoid hemorrhage.  -Patient is visiting out of town from [place] to see his sister. Per port, he has had intermittent episodes of dizziness and confusion. Patient had a witnessed episode of syncope earlier today that was proceeded by dizziness.  -Neurology consult. Recommendation for stroke workup. Holding antiplatelet therapy given small area of bifrontal subarachnoid hemorrhage.    1. Vertigo, intermittent paroxysmal positional dizzines; -"+" dix haulpike, suspected BPPV, reproducible positional vertigo';  -obtain PT/epley eval;   2. Syncope, fall likely hypotension related; neuro exam non focal;  MRI: Posttraumatic subarachnoid hemorrhage in the anterior  interhemispheric fissure. No parenchymal hemorrhage is evident other than a tiny focus of chronic hemorrhage left parietal cortex  -MRI c spine: Multilevel spondylosis. No cervical spine fracture or traumatic  subluxation. No epidural hematoma or cord contusion. The observed CT findings at C2-3, and C3-4 appear to be related to  Facet disease. There is no evidence for ligamentous injury or prevertebral soft tissue swelling.  -echo: LVEF 36-62%, diastolic dysfunction with normal LV filling pressure, mild aortic stenosis, AVA 1.8 cm2; symptoms unlikely unlikely related to AS -Hold BP meds, IVF, PT eval; pend carotids, EEG  3. Subarachnoid hemorrhage due to fall;  -Hold antiplatelet treatment due to subarachnoid hemm  4. Hypertension: ? Taking too much BP meds;  -hold BP meds; monitor   5. Diabetes: Sliding-scale insulin. A1c-6.4; cont diet control; Per patient he is off meds at this time   6. AKI. Stage III on presentation. Unclear if  this is baseline. Suspect chronic issue in the setting of hypertension and diabetes. We'll continue to follow. Hydrate and reassess.  7. Hyperlipidemia: Lipid panel. 8. Leukocytosis, cough; underlying COPD; h/o tobacco use;  -CXR: Probable scarring at the lung bases, greater on the left. Comparison with prior radiographs would be informative. Cannot exclude subtle left upper lobe pulmonary nodule -obtain chest CT, levofloxacin PO, inhalers as needed   Code Status: full Family Communication:  D/w patient (indicate person spoken with, relationship, and if by phone, the number) Disposition Plan: home 24-48 hrs    Consultants:  Neurology   Procedures:  echo  Antibiotics:  Levofloxacin  (indicate start date, and stop date if known)  HPI/Subjective: alert  Objective: Filed Vitals:   07/04/13 0956  BP: 116/71  Pulse: 72  Temp: 98 F (36.7 C)  Resp: 20    Intake/Output Summary (Last 24 hours) at 07/04/13 1214 Last data filed at 07/04/13 0800  Gross per 24 hour  Intake    315 ml  Output    675 ml  Net   -360 ml   Filed Weights   07/02/13 2230  Weight: 84.2 kg (185 lb 10 oz)    Exam:   General:  alert  Cardiovascular: s1.s2 rrr  Respiratory: poor ventilation L  Abdomen: soft, nt,nd   Musculoskeletal: no LE edema   Data Reviewed: Basic Metabolic Panel:  Recent Labs Lab 07/02/13 1703 07/02/13 1711 07/02/13 2100 07/04/13 0630  NA 139 141 140 140  K 4.8 4.6 4.7 5.0  CL 101 106 102 103  CO2 20  --  22 20  GLUCOSE 134* 137* 123* 142*  BUN 34* 33* 32* 36*  CREATININE 1.62* 1.80* 1.55* 1.54*  CALCIUM 9.4  --  9.7 9.8  Liver Function Tests:  Recent Labs Lab 07/02/13 1703 07/02/13 2100  AST 21 19  ALT 22 22  ALKPHOS 60 63  BILITOT 0.3 0.4  PROT 7.3 7.5  ALBUMIN 3.8 3.8   No results found for this basename: LIPASE, AMYLASE,  in the last 168 hours No results found for this basename: AMMONIA,  in the last 168 hours CBC:  Recent Labs Lab  07/02/13 1703 07/02/13 1711 07/04/13 0630  WBC 10.6*  --  18.5*  NEUTROABS 7.0  --   --   HGB 12.3* 13.3 12.6*  HCT 36.6* 39.0 37.7*  MCV 89.9  --  89.1  PLT 233  --  253   Cardiac Enzymes:  Recent Labs Lab 07/03/13 1100  CKTOTAL 77   BNP (last 3 results) No results found for this basename: PROBNP,  in the last 8760 hours CBG:  Recent Labs Lab 07/03/13 0754 07/03/13 1146 07/03/13 1742 07/04/13 0633 07/04/13 1149  GLUCAP 232* 213* 242* 141* 168*    No results found for this or any previous visit (from the past 240 hour(s)).   Studies: Dg Chest 2 View  07/02/2013   CLINICAL DATA:  Short of breath / and hypoxemia. Diabetes and hypertension.  EXAM: CHEST  2 VIEW  COMPARISON:  None.  FINDINGS: Mild hyperinflation. Artifact degradation posteriorly on the lateral view.  Midline trachea. Normal heart size and mediastinal contours for age. No pleural effusion or pneumothorax. Favor scarring at the lung bases, greater on the left. No lobar consolidation. Cannot exclude left upper lobe pulmonary nodule. On the order of 2.0 cm.  IMPRESSION: Hyperinflation, without acute superimposed process or other explanation for shortness of Breath.  Probable scarring at the lung bases, greater on the left. Comparison with prior radiographs would be informative.  Cannot exclude subtle left upper lobe pulmonary nodule. This alternatively could represent summation of osseous shadows. Given the smoking history, chest CT should be considered. If this is not performed, plain film radiographic followup at 3 months should be considered.   Electronically Signed   By: Abigail Miyamoto M.D.   On: 07/02/2013 19:14   Ct Head Wo Contrast  07/02/2013   CLINICAL DATA:  Stroke alert.  Fall.  EXAM: CT HEAD WITHOUT CONTRAST  TECHNIQUE: Contiguous axial images were obtained from the base of the skull through the vertex without intravenous contrast.  COMPARISON:  None.  FINDINGS: Diffuse prominence of the CSF containing spaces  is compatible with generalized cerebral atrophy. Scattered and confluent hypodensity within the periventricular and deep white matter both cerebral hemispheres is most consistent with chronic small vessel ischemic disease.  There is trace hyperdensity within the medial left frontal lobe adjacent to the anterior falx (image 24), suspicious for acute subarachnoid hemorrhage. No significant mass effect. Additional hyperdense foci seen slightly more inferiorly within the parasagittal right frontal lobe may represent small amount of blood as well (series 2, image 21).  No definite acute large vessel territory infarct identified. Wedge-shaped hypodensity within the left cerebellar hemisphere is most compatible with remote infarct. Remote lacunar infarct present within the left basal ganglia.  Prominent vascular calcifications present within the carotid siphons and distal vertebral arteries.  No mass lesion or midline shift. No hydrocephalus. No extra-axial fluid collection.  Air-fluid level present within the left maxillary sinus. Minimal opacity noted within the left sphenoid sinus. Mild mucoperiosteal thickening present within the frontal sinuses bilaterally.  Calvarium is intact. Scalp soft tissues within normal limits. No acute orbital abnormality.  IMPRESSION: 1. Trace  acute subarachnoid hemorrhage within the parafalcine bilateral frontal lobes without significant mass effect. Finding is likely posttraumatic related to fall. 2. Age-indeterminate hypodensity within the left temporal lobe, suspicious for acute ischemia. Further evaluation with brain MRI would likely be helpful for further evaluation. 3. 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 4. Generalized cerebral atrophy with advanced chronic microvascular ischemic disease. 5. Left maxillary sinusitis. Critical Value/emergent results were called by telephone at the time of interpretation on 07/02/2013 at 5:24 PM to Dr. Armida Sans , who verbally  acknowledged these results.   Electronically Signed   By: Jeannine Boga M.D.   On: 07/02/2013 17:36   Ct Cervical Spine Wo Contrast  07/02/2013   CLINICAL DATA:  Stroke.  Possible trauma to  EXAM: CT CERVICAL SPINE WITHOUT CONTRAST  TECHNIQUE: Multidetector CT imaging of the cervical spine was performed without intravenous contrast. Multiplanar CT image reconstructions were also generated.  COMPARISON:  Head CT 07/02/2013.  FINDINGS: Shotty cervical lymph nodes are present. Cervical airway is patent. Thyroid is unremarkable. Pulmonary apices are clear.  Diffuse degenerative changes cervical spine. No evidence of fracture or dislocation. Noted however is 3.6 mm anterior subluxation of C2 on C3 and 5.4 mm anterior subluxation of C3 on C4. Although these changes could be degenerative ligamentous injury cannot be excluded.  IMPRESSION: 1. 3.6 mm anterior subluxation C2 on C3. 2. 5.4 mm anterior subluxation C3 on C4. Although these changes could be secondary to diffuse degenerative change present. Ligamentous injury with subluxation cannot be excluded. 3. No evidence of fracture or dislocation.   Electronically Signed   By: Marcello Moores  Register   On: 07/02/2013 19:17   Mr Jodene Nam Head Wo Contrast  07/02/2013   CLINICAL DATA:  Facial droop after a fall. Left facial weakness and speech difficulty.  EXAM: MRI HEAD WITHOUT CONTRAST  MRA HEAD WITHOUT CONTRAST  TECHNIQUE: Multiplanar, multiecho pulse sequences of the brain and surrounding structures were obtained without intravenous contrast. Angiographic images of the head were obtained using MRA technique without contrast.  COMPARISON:  CT head earlier in the day.  FINDINGS: MRI HEAD FINDINGS  No acute stroke. No mass lesion, hydrocephalus, or extra-axial fluid.  Generalized atrophy. Chronic microvascular ischemic change of a moderate degree. Tiny focus of chronic hemorrhage left parietal cortex. Scattered the [place] throughout the basal ganglia and deep white matter.  Dolichoectatic cerebral vasculature with wide patency; right vertebral is either atretic or ends in PICA. 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  There is evidence for acute subarachnoid hemorrhage in the anterior interhemispheric fissure as suspected from CT. Left sylvian fissure subarachnoid blood not definitely present but could be difficult to detect due to the axial plane of FLAIR imaging. No intraventricular hemorrhage or pre pontine hemorrhage. No midline shift. No osseous findings. Air-fluid level left maxillary sinus felt inflammatory, not posttraumatic. No mastoid fluid. No midline abnormality. No upper cervical lesions. Midline posterior scalp hematoma.  MRA HEAD FINDINGS  The internal carotid arteries are dolichoectatic but widely patent. The basilar artery is solely flow supplied by the left vertebral. These both are widely patent. No proximal stenosis of the anterior, middle, or posterior cerebral arteries. No cerebellar branch occlusion. No intracranial aneurysm.  IMPRESSION: Posttraumatic subarachnoid hemorrhage in the anterior interhemispheric fissure. No parenchymal hemorrhage is evident other than a tiny focus of chronic hemorrhage left parietal cortex.  Atrophy and small vessel disease.  No vascular occlusion or significant stenosis.   Electronically Signed   By: Rolla Flatten M.D.   On: 07/02/2013  20:12   Mr Brain Wo Contrast  07/02/2013   CLINICAL DATA:  Facial droop after a fall. Left facial weakness and speech difficulty.  EXAM: MRI HEAD WITHOUT CONTRAST  MRA HEAD WITHOUT CONTRAST  TECHNIQUE: Multiplanar, multiecho pulse sequences of the brain and surrounding structures were obtained without intravenous contrast. Angiographic images of the head were obtained using MRA technique without contrast.  COMPARISON:  CT head earlier in the day.  FINDINGS: MRI HEAD FINDINGS  No acute stroke. No mass lesion, hydrocephalus, or extra-axial fluid.  Generalized atrophy. Chronic microvascular ischemic change  of a moderate degree. Tiny focus of chronic hemorrhage left parietal cortex. Scattered the [place] throughout the basal ganglia and deep white matter. Dolichoectatic cerebral vasculature with wide patency; right vertebral is either atretic or ends in PICA. 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  There is evidence for acute subarachnoid hemorrhage in the anterior interhemispheric fissure as suspected from CT. Left sylvian fissure subarachnoid blood not definitely present but could be difficult to detect due to the axial plane of FLAIR imaging. No intraventricular hemorrhage or pre pontine hemorrhage. No midline shift. No osseous findings. Air-fluid level left maxillary sinus felt inflammatory, not posttraumatic. No mastoid fluid. No midline abnormality. No upper cervical lesions. Midline posterior scalp hematoma.  MRA HEAD FINDINGS  The internal carotid arteries are dolichoectatic but widely patent. The basilar artery is solely flow supplied by the left vertebral. These both are widely patent. No proximal stenosis of the anterior, middle, or posterior cerebral arteries. No cerebellar branch occlusion. No intracranial aneurysm.  IMPRESSION: Posttraumatic subarachnoid hemorrhage in the anterior interhemispheric fissure. No parenchymal hemorrhage is evident other than a tiny focus of chronic hemorrhage left parietal cortex.  Atrophy and small vessel disease.  No vascular occlusion or significant stenosis.   Electronically Signed   By: Rolla Flatten M.D.   On: 07/02/2013 20:12   Mr Cervical Spine Wo Contrast  07/02/2013   CLINICAL DATA:  Status post fall. Abnormal CT cervical spine. Neck pain.  EXAM: MRI CERVICAL SPINE WITHOUT CONTRAST  TECHNIQUE: Multiplanar, multisequence MR imaging of the cervical spine was performed. No intravenous contrast was administered.  COMPARISON:  CT cervical spine 07/02/2013.  FINDINGS: Minor anterolisthesis at C3-4 has reduced somewhat compared to the CT, with anatomic alignment at C2-3, C4-5,  C5-6, C6-7, and C7-T1 on MR. as seen on MR, anterolisthesis at C3-4 measures no more than 2 mm.  No vertebral body compression deformity, prevertebral soft tissue swelling, ligamentous injury, cord contusion, hematomyelia, or neck mass.  Chronically diseased or hypoplastic/atretic right vertebral. Craniocervical junction unremarkable.  The individual disc spaces were examined as follows:  C2-3:  Unremarkable disc space.  Bilateral facet arthropathy.  C3-4: Central protrusion severe facet arthropathy on the left with uncinate spurring. Left C4 nerve root impingement is evident.  C4-5:  Central protrusion.  Mild canal stenosis.  No impingement.  C5-6: Moderate disc space narrowing. Central protrusion with bilateral uncinate spurring. Mild central canal stenosis. Bilateral C6 nerve root impingement.  C6-7: Moderate disc space narrowing. Central protrusion with left greater than right uncinate spurring. Bilateral C7 nerve root impingement, worse on the left.  C7-T1:  Unremarkable.  IMPRESSION: Multilevel spondylosis. No cervical spine fracture or traumatic subluxation. No epidural hematoma or cord contusion.  The observed CT findings at C2-3, and C3-4 appear to be related to facet disease. There is no evidence for ligamentous injury or prevertebral soft tissue swelling.   Electronically Signed   By: Rolla Flatten M.D.   On: 07/02/2013 20:35  Scheduled Meds: . buPROPion  400 mg Oral q morning - 10a  . colestipol  1 g Oral q morning - 10a  . ferrous sulfate  325 mg Oral Q breakfast  . insulin aspart  0-9 Units Subcutaneous TID WC  . multivitamin with minerals  1 tablet Oral Daily  . pantoprazole  80 mg Oral Daily  . venlafaxine XR  300 mg Oral Q breakfast   Continuous Infusions: . sodium chloride 75 mL/hr at 07/03/13 1207    Active Problems:   Syncope   Subarachnoid hemorrhage   Depression   COPD exacerbation    Time spent: >35 minutes     Kinnie Feil  [facility] Pager [phone].  If 7PM-7AM, please contact night-coverage at [email or web address], password [facility] 07/04/2013, 12:14 PM  LOS: 2 days

## 2013-07-04 NOTE — Progress Notes (Signed)
Stroke Team Progress Note  HISTORY Chief Complaint: Code stroke  HPI:  Jesse Nunez is an 73 y.o. male with a past medical history significant for HTN, hyperlipidemia, DM, and macular degeneration, who was last seen normal by his wife this AM at 0900. She came home and found him with a left facial droop. Per wife he had fallen and then made his way to his couch. Wife told EMS that he patient told he probably fainted and afterwards was disoriented and little bit confused At time of arrival he was out of window for tPA and intervention. initial exam showed only left facial droop.  NIHSS 2  CT brain showed trace acute subarachnoid hemorrhage within the parafalcine bilateral frontal lobes without significant mass effect and age-indeterminate hypodensity within the left temporal lobe, suspicious for acute ischemia.  Patient is on aspirin 81 mg and he is not sure is he takes plavix.  Of importance, he reports chronic dizziness and frequent problems with low blood pressure.  Date last known well: Date: 07/02/2013  Time last known well: Time: 09:00  tPA Given: No: out of the window, possible small post traumatic SAH.  Patient was not administered TPA secondary to outside window. He was admitted to the Internal Medicine Service for further evaluation and treatment. Neurology is consulting.   SUBJECTIVE No acute events overnight. The patient is alert and conversant, and complains of mild dizziness.   OBJECTIVE Most recent Vital Signs: Filed Vitals:   07/03/13 1630 07/03/13 2200 07/03/13 2300 07/04/13 0609  BP: 118/65 121/77 145/78 135/78  Pulse: 82 81  71  Temp: 98.6 F (37 C) 98.1 F (36.7 C)  98 F (36.7 C)  TempSrc:  Oral  Oral  Resp:  18  18  Height:      Weight:      SpO2: 100% 100%  95%   CBG (last 3)   Recent Labs  07/03/13 1146 07/03/13 1742 07/04/13 0633  GLUCAP 213* 242* 141*    IV Fluid Intake:   . sodium chloride 75 mL/hr at 07/03/13 1207    MEDICATIONS  . buPROPion   400 mg Oral q morning - 10a  . colestipol  1 g Oral q morning - 10a  . ferrous sulfate  325 mg Oral Q breakfast  . insulin aspart  0-9 Units Subcutaneous TID WC  . multivitamin with minerals  1 tablet Oral Daily  . pantoprazole  80 mg Oral Daily  . venlafaxine XR  300 mg Oral Q breakfast   PRN:  ipratropium-albuterol, senna-docusate, zolpidem  Diet:  Carb Control thin liquids Activity:  Pending PT eval DVT Prophylaxis:  None, hemorrhage  CLINICALLY SIGNIFICANT STUDIES Basic Metabolic Panel:   Recent Labs Lab 07/02/13 2100 07/04/13 0630  NA 140 140  K 4.7 5.0  CL 102 103  CO2 22 20  GLUCOSE 123* 142*  BUN 32* 36*  CREATININE 1.55* 1.54*  CALCIUM 9.7 9.8   Liver Function Tests:   Recent Labs Lab 07/02/13 1703 07/02/13 2100  AST 21 19  ALT 22 22  ALKPHOS 60 63  BILITOT 0.3 0.4  PROT 7.3 7.5  ALBUMIN 3.8 3.8   CBC:   Recent Labs Lab 07/02/13 1703 07/02/13 1711 07/04/13 0630  WBC 10.6*  --  18.5*  NEUTROABS 7.0  --   --   HGB 12.3* 13.3 12.6*  HCT 36.6* 39.0 37.7*  MCV 89.9  --  89.1  PLT 233  --  253   Coagulation:   Recent Labs  Lab 07/02/13 1703  LABPROT 13.3  INR 1.03   Cardiac Enzymes:   Recent Labs Lab 07/03/13 1100  CKTOTAL 77   Urinalysis:   Recent Labs Lab 07/02/13 1829  COLORURINE AMBER*  LABSPEC 1.030  PHURINE 5.0  GLUCOSEU NEGATIVE  HGBUR NEGATIVE  BILIRUBINUR SMALL*  KETONESUR 15*  PROTEINUR NEGATIVE  UROBILINOGEN 0.2  NITRITE NEGATIVE  LEUKOCYTESUR NEGATIVE   Lipid Panel    Component Value Date/Time   CHOL 227* 07/03/2013 0032   TRIG 354* 07/03/2013 0032   HDL 37* 07/03/2013 0032   CHOLHDL 6.1 07/03/2013 0032   VLDL 71* 07/03/2013 0032   LDLCALC 119* 07/03/2013 0032   HgbA1C  Lab Results  Component Value Date   HGBA1C 6.4* 07/02/2013    Urine Drug Screen:     Component Value Date/Time   LABOPIA NONE DETECTED 07/02/2013 1829   COCAINSCRNUR NONE DETECTED 07/02/2013 1829   LABBENZ NONE DETECTED 07/02/2013 1829    AMPHETMU POSITIVE* 07/02/2013 1829   THCU NONE DETECTED 07/02/2013 1829   LABBARB NONE DETECTED 07/02/2013 1829    Alcohol Level:   Recent Labs Lab 07/02/13 1703  ETH <11    Dg Chest 2 View  07/02/2013   CLINICAL DATA:  Short of breath / and hypoxemia. Diabetes and hypertension.  EXAM: CHEST  2 VIEW  COMPARISON:  None.  FINDINGS: Mild hyperinflation. Artifact degradation posteriorly on the lateral view.  Midline trachea. Normal heart size and mediastinal contours for age. No pleural effusion or pneumothorax. Favor scarring at the lung bases, greater on the left. No lobar consolidation. Cannot exclude left upper lobe pulmonary nodule. On the order of 2.0 cm.  IMPRESSION: Hyperinflation, without acute superimposed process or other explanation for shortness of Breath.  Probable scarring at the lung bases, greater on the left. Comparison with prior radiographs would be informative.  Cannot exclude subtle left upper lobe pulmonary nodule. This alternatively could represent summation of osseous shadows. Given the smoking history, chest CT should be considered. If this is not performed, plain film radiographic followup at 3 months should be considered.   Electronically Signed   By: Abigail Miyamoto M.D.   On: 07/02/2013 19:14   Ct Head Wo Contrast  07/02/2013   CLINICAL DATA:  Stroke alert.  Fall.  EXAM: CT HEAD WITHOUT CONTRAST  TECHNIQUE: Contiguous axial images were obtained from the base of the skull through the vertex without intravenous contrast.  COMPARISON:  None.  FINDINGS: Diffuse prominence of the CSF containing spaces is compatible with generalized cerebral atrophy. Scattered and confluent hypodensity within the periventricular and deep white matter both cerebral hemispheres is most consistent with chronic small vessel ischemic disease.  There is trace hyperdensity within the medial left frontal lobe adjacent to the anterior falx (image 24), suspicious for acute subarachnoid hemorrhage. No significant  mass effect. Additional hyperdense foci seen slightly more inferiorly within the parasagittal right frontal lobe may represent small amount of blood as well (series 2, image 21).  No definite acute large vessel territory infarct identified. Wedge-shaped hypodensity within the left cerebellar hemisphere is most compatible with remote infarct. Remote lacunar infarct present within the left basal ganglia.  Prominent vascular calcifications present within the carotid siphons and distal vertebral arteries.  No mass lesion or midline shift. No hydrocephalus. No extra-axial fluid collection.  Air-fluid level present within the left maxillary sinus. Minimal opacity noted within the left sphenoid sinus. Mild mucoperiosteal thickening present within the frontal sinuses bilaterally.  Calvarium is intact. Scalp soft tissues within  normal limits. No acute orbital abnormality.  IMPRESSION: 1. Trace acute subarachnoid hemorrhage within the parafalcine bilateral frontal lobes without significant mass effect. Finding is likely posttraumatic related to fall. 2. Age-indeterminate hypodensity within the left temporal lobe, suspicious for acute ischemia. Further evaluation with brain MRI would likely be helpful for further evaluation. 3. 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 4. Generalized cerebral atrophy with advanced chronic microvascular ischemic disease. 5. Left maxillary sinusitis. Critical Value/emergent results were called by telephone at the time of interpretation on 07/02/2013 at 5:24 PM to Dr. Armida Sans , who verbally acknowledged these results.   Electronically Signed   By: Jeannine Boga M.D.   On: 07/02/2013 17:36   Ct Cervical Spine Wo Contrast  07/02/2013   CLINICAL DATA:  Stroke.  Possible trauma to  EXAM: CT CERVICAL SPINE WITHOUT CONTRAST  TECHNIQUE: Multidetector CT imaging of the cervical spine was performed without intravenous contrast. Multiplanar CT image reconstructions were also  generated.  COMPARISON:  Head CT 07/02/2013.  FINDINGS: Shotty cervical lymph nodes are present. Cervical airway is patent. Thyroid is unremarkable. Pulmonary apices are clear.  Diffuse degenerative changes cervical spine. No evidence of fracture or dislocation. Noted however is 3.6 mm anterior subluxation of C2 on C3 and 5.4 mm anterior subluxation of C3 on C4. Although these changes could be degenerative ligamentous injury cannot be excluded.  IMPRESSION: 1. 3.6 mm anterior subluxation C2 on C3. 2. 5.4 mm anterior subluxation C3 on C4. Although these changes could be secondary to diffuse degenerative change present. Ligamentous injury with subluxation cannot be excluded. 3. No evidence of fracture or dislocation.   Electronically Signed   By: Marcello Moores  Register   On: 07/02/2013 19:17   Mr Jodene Nam Head Wo Contrast  07/02/2013   CLINICAL DATA:  Facial droop after a fall. Left facial weakness and speech difficulty.  EXAM: MRI HEAD WITHOUT CONTRAST  MRA HEAD WITHOUT CONTRAST  TECHNIQUE: Multiplanar, multiecho pulse sequences of the brain and surrounding structures were obtained without intravenous contrast. Angiographic images of the head were obtained using MRA technique without contrast.  COMPARISON:  CT head earlier in the day.  FINDINGS: MRI HEAD FINDINGS  No acute stroke. No mass lesion, hydrocephalus, or extra-axial fluid.  Generalized atrophy. Chronic microvascular ischemic change of a moderate degree. Tiny focus of chronic hemorrhage left parietal cortex. Scattered the [place] throughout the basal ganglia and deep white matter. Dolichoectatic cerebral vasculature with wide patency; right vertebral is either atretic or ends in PICA. 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  There is evidence for acute subarachnoid hemorrhage in the anterior interhemispheric fissure as suspected from CT. Left sylvian fissure subarachnoid blood not definitely present but could be difficult to detect due to the axial plane of FLAIR imaging.  No intraventricular hemorrhage or pre pontine hemorrhage. No midline shift. No osseous findings. Air-fluid level left maxillary sinus felt inflammatory, not posttraumatic. No mastoid fluid. No midline abnormality. No upper cervical lesions. Midline posterior scalp hematoma.  MRA HEAD FINDINGS  The internal carotid arteries are dolichoectatic but widely patent. The basilar artery is solely flow supplied by the left vertebral. These both are widely patent. No proximal stenosis of the anterior, middle, or posterior cerebral arteries. No cerebellar branch occlusion. No intracranial aneurysm.  IMPRESSION: Posttraumatic subarachnoid hemorrhage in the anterior interhemispheric fissure. No parenchymal hemorrhage is evident other than a tiny focus of chronic hemorrhage left parietal cortex.  Atrophy and small vessel disease.  No vascular occlusion or significant stenosis.   Electronically Signed  By: Rolla Flatten M.D.   On: 07/02/2013 20:12   Mr Brain Wo Contrast  07/02/2013   CLINICAL DATA:  Facial droop after a fall. Left facial weakness and speech difficulty.  EXAM: MRI HEAD WITHOUT CONTRAST  MRA HEAD WITHOUT CONTRAST  TECHNIQUE: Multiplanar, multiecho pulse sequences of the brain and surrounding structures were obtained without intravenous contrast. Angiographic images of the head were obtained using MRA technique without contrast.  COMPARISON:  CT head earlier in the day.  FINDINGS: MRI HEAD FINDINGS  No acute stroke. No mass lesion, hydrocephalus, or extra-axial fluid.  Generalized atrophy. Chronic microvascular ischemic change of a moderate degree. Tiny focus of chronic hemorrhage left parietal cortex. Scattered the [place] throughout the basal ganglia and deep white matter. Dolichoectatic cerebral vasculature with wide patency; right vertebral is either atretic or ends in PICA. 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  There is evidence for acute subarachnoid hemorrhage in the anterior interhemispheric fissure as  suspected from CT. Left sylvian fissure subarachnoid blood not definitely present but could be difficult to detect due to the axial plane of FLAIR imaging. No intraventricular hemorrhage or pre pontine hemorrhage. No midline shift. No osseous findings. Air-fluid level left maxillary sinus felt inflammatory, not posttraumatic. No mastoid fluid. No midline abnormality. No upper cervical lesions. Midline posterior scalp hematoma.  MRA HEAD FINDINGS  The internal carotid arteries are dolichoectatic but widely patent. The basilar artery is solely flow supplied by the left vertebral. These both are widely patent. No proximal stenosis of the anterior, middle, or posterior cerebral arteries. No cerebellar branch occlusion. No intracranial aneurysm.  IMPRESSION: Posttraumatic subarachnoid hemorrhage in the anterior interhemispheric fissure. No parenchymal hemorrhage is evident other than a tiny focus of chronic hemorrhage left parietal cortex.  Atrophy and small vessel disease.  No vascular occlusion or significant stenosis.   Electronically Signed   By: Rolla Flatten M.D.   On: 07/02/2013 20:12   Mr Cervical Spine Wo Contrast  07/02/2013   CLINICAL DATA:  Status post fall. Abnormal CT cervical spine. Neck pain.  EXAM: MRI CERVICAL SPINE WITHOUT CONTRAST  TECHNIQUE: Multiplanar, multisequence MR imaging of the cervical spine was performed. No intravenous contrast was administered.  COMPARISON:  CT cervical spine 07/02/2013.  FINDINGS: Minor anterolisthesis at C3-4 has reduced somewhat compared to the CT, with anatomic alignment at C2-3, C4-5, C5-6, C6-7, and C7-T1 on MR. as seen on MR, anterolisthesis at C3-4 measures no more than 2 mm.  No vertebral body compression deformity, prevertebral soft tissue swelling, ligamentous injury, cord contusion, hematomyelia, or neck mass.  Chronically diseased or hypoplastic/atretic right vertebral. Craniocervical junction unremarkable.  The individual disc spaces were examined as  follows:  C2-3:  Unremarkable disc space.  Bilateral facet arthropathy.  C3-4: Central protrusion severe facet arthropathy on the left with uncinate spurring. Left C4 nerve root impingement is evident.  C4-5:  Central protrusion.  Mild canal stenosis.  No impingement.  C5-6: Moderate disc space narrowing. Central protrusion with bilateral uncinate spurring. Mild central canal stenosis. Bilateral C6 nerve root impingement.  C6-7: Moderate disc space narrowing. Central protrusion with left greater than right uncinate spurring. Bilateral C7 nerve root impingement, worse on the left.  C7-T1:  Unremarkable.  IMPRESSION: Multilevel spondylosis. No cervical spine fracture or traumatic subluxation. No epidural hematoma or cord contusion.  The observed CT findings at C2-3, and C3-4 appear to be related to facet disease. There is no evidence for ligamentous injury or prevertebral soft tissue swelling.   Electronically Signed   By:  Rolla Flatten M.D.   On: 07/02/2013 20:35    CT of the brain   IMPRESSION:  1. Trace acute subarachnoid hemorrhage within the parafalcine  bilateral frontal lobes without significant mass effect. Finding is  likely posttraumatic related to fall.  2. Age-indeterminate hypodensity within the left temporal lobe,  suspicious for acute ischemia. Further evaluation with brain MRI  would likely be helpful for further evaluation.  3. 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  4. Generalized cerebral atrophy with advanced chronic microvascular  ischemic disease.  5. Left maxillary sinusitis.  MRI of the brain   IMPRESSION:  Posttraumatic subarachnoid hemorrhage in the anterior  interhemispheric fissure. No parenchymal hemorrhage is evident other  than a tiny focus of chronic hemorrhage left parietal cortex.  Atrophy and small vessel disease.  No vascular occlusion or significant stenosis.  MRA of the brain   See above  Echo  Study Conclusions  - Left ventricle: The  cavity size was normal. Wall thickness was increased in a pattern of mild LVH. Systolic function was normal. The estimated ejection fraction was in the range of 55% to 60%. Wall motion was normal; there were no regional wall motion abnormalities. Doppler parameters are consistent with abnormal left ventricular relaxation (grade 1 diastolic dysfunction). The E/e&' ratio is <8, suggesting normal LV filling pressure. - Aortic valve: Trileaflet; mildly calcified leaflets. Peak and mean gradients of 25 and 12 mmHg, respectively. Based on an LVOT diameter of 2.4 cm, the calculated AVA is 1.8 cm2, consistent with mild aortic stenosis. There was trivial regurgitation. Valve area (VTI): 1.24 cm^2. Valve area (Vmax): 1.09 cm^2. Valve area (Vmean): 1.8 cm^2. - Mitral valve: Calcified annulus. There is calcification of the leaflet tips. There was mild regurgitation. - Left atrium: The atrium was normal in size.  Impressions:  - LVEF 10-17%, diastolic dysfunction with normal LV filling pressure, mild aortic stenosis, AVA 1.8 cm2.   Carotid US  pending  CXR   IMPRESSION:  Hyperinflation, without acute superimposed process or other  explanation for shortness of Breath.  Probable scarring at the lung bases, greater on the left. Comparison  with prior radiographs would be informative.  Cannot exclude subtle left upper lobe pulmonary nodule. This  alternatively could represent summation of osseous shadows. Given  the smoking history, chest CT should be considered. If this is not  performed, plain film radiographic followup at 3 months should be  considered.  EKG  Sinus rhythm Prolonged PR interval  Therapy Recommendations  no PT recommended  Physical Exam  General: The patient is alert and cooperative at the time of the examination.  Skin: No significant peripheral edema is noted.   Neurologic Exam  Mental status: The patient is oriented to person, place, time, and event.  Speech:  Mild dysarthria, patient states he feels this is due to loose dentures. Patient able to name and repeat.   Cranial nerves: Mild L facial weakness.  PERRL. Extraocular movements are full. Visual fields are full.   Motor: The patient has good strength in all 4 extremities.  Sensory examination: Soft touch sensation is symmetric on the face, arms, and legs.  Coordination: The patient has good finger-nose-finger and heel-to-shin bilaterally.   Gait and station: Did not assess gait or Romberg.  Reflexes: Deep tendon reflexes are 2+ and symmetric.   ASSESSMENT Mr. Jesse Nunez is a 73 y.o. male  admitted following an apparent syncopal event at home. The patient gives a several month history of increasing problems  with dizziness with standing. He has eliminated Norvasc as one of his blood pressure medications as he has had documented hypotensive events associated with wearing of vision and mental confusion. The patient reports frequent episodes of dizziness with standing.   Syncope on admission  Hypertension  Diabetes  Dyslipidemia  History of depression   Post concussive vertigo   Hospital day # 2  Primary event on this admission was not a TIA, but rather likely a syncopal event associated with orthostatic hypotension. The patient has had a several month history of increasing problems with dizziness while standing, with documented episodes of hypotension associated with blurring of vision and mental confusion. He denies palpitations of the heart. He recently discontinued Norvasc because of the hypotensive events.  The patient does note some vertigo at this time, this has come on since his fall, likely represents post concussive vertigo.  TREATMENT/PLAN  Carotid Doppler study, if the carotid Doppler study is unremarkable, the patient could be discharged home.  EEG evaluation, results pending  Will need orthostatic blood pressure checks off of IV fluids  LDL 119, use of statin  drugs  Aspirin therapy in the future, currently held secondary to head trauma and small subarachnoid bleeding  Cardiac monitoring  SIGNED Delbert Phenix, MSN, ANP-C, [place], MSCS [facility] Stroke Team [phone] Lenor Coffin   I have personally obtained a history, examined the patient, evaluated imaging results, and formulated the assessment and plan of care. I agree with the above.    To contact Stroke Continuity provider, please refer to [email or web address] After hours, contact General Neurology

## 2013-07-04 NOTE — Progress Notes (Signed)
Physical Therapy Treatment/Vestibular Evaluation Patient Details Name: Jesse Nunez MRN: [id number] DOB: 10/02/1940 Today's Date: 07/04/2013    History of Present Illness Jesse Nunez is a 73 y.o. Male with PMH of HTN, hyperlipidemia, DM, and macular degeneration. Pt was admitted 07/02/13 for syncopal episode at home and subsequent L facial droop. Pt reports no memory of fall. CT brain showed trace acute subarachnoid hemorrhage.  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  He reports long history of dizziness and vertigo (>2 years), but was never treated.      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  Pt treated with Epley's maneuver x 3 with continued, but diminished positive results.  He would benefit from continued management/assessment of his R psoterior canal BPPV both here in the hospital and at discharge.  PT recommending OP PT for vestibular therapy.  Pt lives in [place] and would prefer for it to be in the [place] area.   Follow Up Recommendations  Outpatient PT;Other (comment) (in [facility] for Vestibular therapy)     Equipment Recommendations  None recommended by PT    Recommendations for Other Services   NA     Precautions / Restrictions   None   Mobility  Bed Mobility Overal bed mobility: Independent                Transfers Overall transfer level: Independent Equipment used: None                Ambulation/Gait Ambulation/Gait assistance: Supervision Ambulation Distance (Feet): 15 Feet Assistive device: None Gait Pattern/deviations: WFL(Within Functional Limits)                   Cognition Arousal/Alertness: Awake/alert Behavior During Therapy: WFL for tasks assessed/performed Overall Cognitive Status: Within Functional Limits for tasks assessed                         General Comments General comments  (skin integrity, edema, etc.): Vestibular assessment completed and pt found to have very positive R posterior canal BPPV with (+) upwardly rotating nystagmus.  Preformed Epley's maneuver x 3 with continued (+) results.  Nystagmus lasted for <15 seconds and had anywhere from 3-10 beats at a time (less beats with continued Epley's).  There was often a 5-10 second delay before symptoms would come on when brought back into R [employer] position.        Pertinent Vitals/Pain See vitals flow sheet.            PT Goals (current goals can now be found in the care plan section) Acute Rehab PT Goals Patient Stated Goal: to stop feeling dizzy Progress towards PT goals: Progressing toward goals    Frequency  Min 3X/week    PT Plan Current plan remains appropriate       End of Session   Activity Tolerance: Patient tolerated treatment well Patient left: in chair;with call bell/phone within reach;with family/visitor present     Time: 4481-8563 PT Time Calculation (min): 30 min  Charges:  $Self Care/Home Management: 8-22 $[facility] Rep Proc: 8-22 mins                     Jesse Nunez, [place], DPT [phone]   07/04/2013, 5:33 PM

## 2013-07-04 NOTE — Progress Notes (Signed)
OT Cancellation Note  Patient Details Name: Jesse Nunez MRN: [id number] DOB: 07/30/1940   Cancelled Treatment:    Reason Eval/Treat Not Completed: Patient at procedure or test/ unavailable. Pt off floor; OT will follow up for evaluation as time allows.   Juluis Rainier [phone] 07/04/2013, 9:25 AM

## 2013-07-04 NOTE — Progress Notes (Signed)
Occupational Therapy Evaluation Patient Details Name: Kelden Lavallee MRN: [id number] DOB: [birth year] Today's Date: 07/04/2013    History of Present Illness Santos Sollenberger is a 73 y.o. Male with PMH of HTN, hyperlipidemia, DM, and macular degeneration. Pt was admitted 07/02/13 for syncopal episode at home and subsequent L facial droop. Pt reports no memory of fall. CT brain showed trace acute subarachnoid hemorrhage.    Clinical Impression   PTA pt lived alone and was independent with ADLs and functional mobility. He reports approximately a 2 year history of vertigo, however this was his first syncopal episode. Pt reports any head movement now stimulates "room spinning" dizziness and he reports it is worse when he sits up than when he stands. Brief assessment performed at bedside. MD has requested vestibular evaluation to perform Epley maneuver and OT will finish ADL assessment following vestibular eval/treat. Pt would benefit from continued OT services to address safety with ADLs.     Follow Up Recommendations  No OT follow up;Supervision/Assistance - 24 hour    Equipment Recommendations  None recommended by OT       Precautions / Restrictions Precautions Precautions: Fall Restrictions Weight Bearing Restrictions: No      Mobility Bed Mobility               General bed mobility comments: Not assessed at this time.  Transfers                 General transfer comment: Not assessed at this time.          ADL Overall ADL's : Needs assistance/impaired Eating/Feeding: Independent;Sitting   Grooming: Set up;Sitting   Upper Body Bathing: Supervision/ safety;Set up;Sitting       Upper Body Dressing : Supervision/safety;Set up;Sitting                     General ADL Comments: Brief assessment performed at bedside with pt. Pt reports dizziness is greater when he sits up. Visual assessment performed and +nystagmus noted. Pt had questions regarding vestibular system  following conversation with MD. OT address pt's questions and educated pt on use of Epley maneuver. MD has requested vestibular assessment for pt. OT will plan to follow up for further ADL assessment following vestibular eval/treatment.      Vision  Pt reports dizziness with head movement. With head stable, pt reports no change from baseline. No spontaneous nystagmus noted. In supine, pt performed visual assessment with no apparent visual deficits.                Additional Comments: +nystagmus   Perception Perception Perception Tested?: No   Praxis Praxis Praxis tested?: Within functional limits    Pertinent Vitals/Pain NAD     Hand Dominance Right   Extremity/Trunk Assessment Upper Extremity Assessment Upper Extremity Assessment: Overall WFL for tasks assessed   Lower Extremity Assessment Lower Extremity Assessment: Overall WFL for tasks assessed   Cervical / Trunk Assessment Cervical / Trunk Assessment: Normal   Communication Communication Communication: No difficulties   Cognition Arousal/Alertness: Awake/alert Behavior During Therapy: WFL for tasks assessed/performed Overall Cognitive Status: Within Functional Limits for tasks assessed                                Home Living Family/patient expects to be discharged to:: Private residence Living Arrangements: Other relatives (sister) Available Help at Discharge: Family;Available PRN/intermittently Type of Home: Apartment Home Access: Level entry  Home Layout: One level (sister's house is 2 story, he sleeps upstairs)     Bathroom Shower/Tub: Tub/shower unit Shower/tub characteristics: Architectural technologist: Standard     Home Equipment: Shower seat          Prior Functioning/Environment Level of Independence: Independent        Comments: pt reports that he was driving despite some vertigo    OT Diagnosis: Other (comment);Disturbance of vision (balance disturbance)   OT  Problem List: Impaired balance (sitting and/or standing);Decreased safety awareness   OT Treatment/Interventions: Self-care/ADL training;Energy conservation;Balance training;Patient/family education    OT Goals(Current goals can be found in the care plan section) Acute Rehab OT Goals Patient Stated Goal: to stop feeling dizzy OT Goal Formulation: With patient Time For Goal Achievement: 07/11/13 Potential to Achieve Goals: Good  OT Frequency: Min 2X/week    End of Session Nurse Communication: Other (comment) (MD requests vestibular eval)  Activity Tolerance: Patient tolerated treatment well Patient left: in bed;with call bell/phone within reach   Time: 1041-1101 OT Time Calculation (min): 20 min Charges:  OT General Charges $OT Visit: 1 Procedure OT Evaluation $Initial OT Evaluation Tier I: 1 Procedure OT Treatments $Self Care/Home Management : 8-22 mins  Juluis Rainier  [phone]  07/04/2013, 11:27 AM

## 2013-07-04 NOTE — Progress Notes (Signed)
  Echocardiogram 2D Echocardiogram has been performed.  Diamond Nickel 07/04/2013, 9:31 AM

## 2013-07-04 NOTE — Progress Notes (Signed)
Occupational Therapy Treatment and Discharge Patient Details Name: Jesse Nunez MRN: [id number] DOB: [birth year] Today's Date: 07/04/2013    History of present illness Jesse Nunez is a 73 y.o. Male with PMH of HTN, hyperlipidemia, DM, and macular degeneration. Pt was admitted 07/02/13 for syncopal episode at home and subsequent L facial droop. Pt reports no memory of fall. CT brain showed trace acute subarachnoid hemorrhage.  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  He reports long history of dizziness and vertigo (>2 years), but was never treated.     OT comments  Pt seen following Vestibular evaluation and treatment and now currently at Vandiver level with ADLs and reports less dizziness. PT is recommending vestibular training for OP follow-up. Pt has no further acute OT needs.   Follow Up Recommendations  No OT follow up;Supervision/Assistance - 24 hour    Equipment Recommendations  None recommended by OT       Precautions / Restrictions Precautions Precautions: Fall Restrictions Weight Bearing Restrictions: No       Mobility Bed Mobility Overal bed mobility: Independent                Transfers Overall transfer level: Independent Equipment used: None                      ADL Overall ADL's : Modified independent                                       General ADL Comments: Pt would benefit from Supervision for functional mobility greater than a few feet, however able to perform ADLs at Mod Independent level.                 Cognition  Arousal/Alertness: Awake/Alert Behavior During Therapy: WFL for tasks assessed/performed Overall Cognitive Status: Within Functional Limits for tasks assessed                                    Pertinent Vitals/ Pain       NAD            Progress Toward Goals  OT Goals(current goals can now be found in the care plan section)  Progress towards OT  goals: Goals met/education completed, patient discharged from OT  Acute Rehab OT Goals Patient Stated Goal: to stop feeling dizzy  Plan All goals met and education completed, patient discharged from OT services       End of Session    Activity Tolerance Patient tolerated treatment well   Patient Left in chair;with call bell/phone within reach;with family/visitor present   Nurse Communication          Time: 1640-1650 OT Time Calculation (min): 10 min  Charges: OT General Charges $OT Visit: 1 Procedure OT Treatments $Self Care/Home Management : 8-22 mins  Juluis Rainier [phone] 07/04/2013, 6:15 PM

## 2013-07-05 DIAGNOSIS — J189 Pneumonia, unspecified organism: Secondary | ICD-10-CM

## 2013-07-05 LAB — CBC
HCT: 37.1 % — ABNORMAL LOW (ref 39.0–52.0)
Hemoglobin: 12.3 g/dL — ABNORMAL LOW (ref 13.0–17.0)
MCH: 29.8 pg (ref 26.0–34.0)
MCHC: 33.2 g/dL (ref 30.0–36.0)
MCV: 89.8 fL (ref 78.0–100.0)
Platelets: 212 10*3/uL (ref 150–400)
RBC: 4.13 MIL/uL — ABNORMAL LOW (ref 4.22–5.81)
RDW: 13.5 % (ref 11.5–15.5)
WBC: 9.7 10*3/uL (ref 4.0–10.5)

## 2013-07-05 LAB — GLUCOSE, CAPILLARY
Glucose-Capillary: 90 mg/dL (ref 70–99)
Glucose-Capillary: 133 mg/dL — ABNORMAL HIGH (ref 70–99)
Glucose-Capillary: 96 mg/dL (ref 70–99)
Glucose-Capillary: 117 mg/dL — ABNORMAL HIGH (ref 70–99)

## 2013-07-05 NOTE — Progress Notes (Signed)
Stroke Team Progress Note  HISTORY Jesse Nunez is an 73 y.o. male with a past medical history significant for HTN, hyperlipidemia, DM, and macular degeneration, who was last seen normal by his wife this AM 07/02/2013 at 0900. She came home and found him with a left facial droop. Per wife he had fallen and then made his way to his couch. Wife told EMS that he patient told he probably fainted and afterwards was disoriented and little bit confused At time of arrival he was out of window for tPA and intervention. initial exam showed only left facial droop. NIHSS 2. CT brain showed trace acute subarachnoid hemorrhage within the parafalcine bilateral frontal lobes without significant mass effect and age-indeterminate hypodensity within the left temporal lobe, suspicious for acute ischemia. Patient is on aspirin 81 mg and he is not sure is he takes plavix. Of importance, he reports chronic dizziness and frequent problems with low blood pressure. Patient was not administered TPA secondary to outside window, possible small post traumatic SAH. He was admitted to the Internal Medicine Service for further evaluation and treatment. Neurology is consulting.   SUBJECTIVE Feels generally weak. No focal weakness or numbness.   OBJECTIVE Most recent Vital Signs: Filed Vitals:   07/04/13 2100 07/05/13 0200 07/05/13 0600 07/05/13 0946  BP: 134/70 142/70 135/70 133/72  Pulse: 72 77 62 67  Temp: 98 F (36.7 C) 98.3 F (36.8 C) 97.6 F (36.4 C) 97.6 F (36.4 C)  TempSrc: Oral Oral Oral Oral  Resp: 18 19 18 18   Height:      Weight:      SpO2: 92% 96% 94% 94%   CBG (last 3)   Recent Labs  07/04/13 1659 07/05/13 0617 07/05/13 1154  GLUCAP 106* 90 133*    IV Fluid Intake:   . sodium chloride 50 mL/hr at 07/05/13 1100    MEDICATIONS  . buPROPion  400 mg Oral q morning - 10a  . colestipol  1 g Oral q morning - 10a  . ferrous sulfate  325 mg Oral Q breakfast  . insulin aspart  0-9 Units Subcutaneous TID  WC  . levofloxacin  750 mg Oral Q48H  . multivitamin with minerals  1 tablet Oral Daily  . pantoprazole  80 mg Oral Daily  . venlafaxine XR  300 mg Oral Q breakfast   PRN:  ipratropium-albuterol, ondansetron, senna-docusate, zolpidem  Diet:  Carb Control thin liquids Activity:   DVT Prophylaxis:  SCDs   CLINICALLY SIGNIFICANT STUDIES Basic Metabolic Panel:   Recent Labs Lab 07/02/13 2100 07/04/13 0630  NA 140 140  K 4.7 5.0  CL 102 103  CO2 22 20  GLUCOSE 123* 142*  BUN 32* 36*  CREATININE 1.55* 1.54*  CALCIUM 9.7 9.8   Liver Function Tests:   Recent Labs Lab 07/02/13 1703 07/02/13 2100  AST 21 19  ALT 22 22  ALKPHOS 60 63  BILITOT 0.3 0.4  PROT 7.3 7.5  ALBUMIN 3.8 3.8   CBC:  Recent Labs Lab 07/02/13 1703  07/04/13 0630 07/05/13 0533  WBC 10.6*  --  18.5* 9.7  NEUTROABS 7.0  --   --   --   HGB 12.3*  < > 12.6* 12.3*  HCT 36.6*  < > 37.7* 37.1*  MCV 89.9  --  89.1 89.8  PLT 233  --  253 212  < > = values in this interval not displayed. Coagulation:   Recent Labs Lab 07/02/13 1703  LABPROT 13.3  INR 1.03  Cardiac Enzymes:   Recent Labs Lab 07/03/13 1100  CKTOTAL 77   Urinalysis:   Recent Labs Lab 07/02/13 1829  COLORURINE AMBER*  LABSPEC 1.030  PHURINE 5.0  GLUCOSEU NEGATIVE  HGBUR NEGATIVE  BILIRUBINUR SMALL*  KETONESUR 15*  PROTEINUR NEGATIVE  UROBILINOGEN 0.2  NITRITE NEGATIVE  LEUKOCYTESUR NEGATIVE   Lipid Panel    Component Value Date/Time   CHOL 227* 07/03/2013 0032   TRIG 354* 07/03/2013 0032   HDL 37* 07/03/2013 0032   CHOLHDL 6.1 07/03/2013 0032   VLDL 71* 07/03/2013 0032   LDLCALC 119* 07/03/2013 0032   HgbA1C  Lab Results  Component Value Date   HGBA1C 6.4* 07/02/2013    Urine Drug Screen:     Component Value Date/Time   LABOPIA NONE DETECTED 07/02/2013 1829   COCAINSCRNUR NONE DETECTED 07/02/2013 1829   LABBENZ NONE DETECTED 07/02/2013 1829   AMPHETMU POSITIVE* 07/02/2013 1829   THCU NONE DETECTED 07/02/2013  1829   LABBARB NONE DETECTED 07/02/2013 1829    Alcohol Level:   Recent Labs Lab 07/02/13 1703  ETH <11     CT Cervical Spine 07/02/2013    1. 3.6 mm anterior subluxation C2 on C3. 2. 5.4 mm anterior subluxation C3 on C4. Although these changes could be secondary to diffuse degenerative change present. Ligamentous injury with subluxation cannot be excluded. 3. No evidence of fracture or dislocation.     MRI Cervical Spine  07/02/2013     Multilevel spondylosis. No cervical spine fracture or traumatic subluxation. No epidural hematoma or cord contusion.  The observed CT findings at C2-3, and C3-4 appear to be related to facet disease. There is no evidence for ligamentous injury or prevertebral soft tissue swelling.      CT of the brain  07/02/2013   1. Trace acute subarachnoid hemorrhage within the parafalcine bilateral frontal lobes without significant mass effect. Finding is likely posttraumatic related to fall. 2. Age-indeterminate hypodensity within the left temporal lobe, suspicious for acute ischemia. Further evaluation with brain MRI would likely be helpful for further evaluation. 3. 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 4. Generalized cerebral atrophy with advanced chronic microvascular ischemic disease. 5. Left maxillary sinusitis.   MRI of the brain  07/02/2013    Posttraumatic subarachnoid hemorrhage in the anterior interhemispheric fissure. No parenchymal hemorrhage is evident other than a tiny focus of chronic hemorrhage left parietal cortex.  Atrophy and small vessel disease.   MRA of the brain  07/02/2013      No vascular occlusion or significant stenosis.     2D Echocardiogram  EF 55-60% with no source of embolus.   Carotid Doppler    CXR  07/02/2013   Hyperinflation, without acute superimposed process or other explanation for shortness of Breath.  Probable scarring at the lung bases, greater on the left. Comparison with prior radiographs would be informative.   Cannot exclude subtle left upper lobe pulmonary nodule. This alternatively could represent summation of osseous shadows. Given the smoking history, chest CT should be considered. If this is not performed, plain film radiographic followup at 3 months should be considered.     CT Chest  07/04/2013    1. No evidence of a mass or infiltrate in the left upper lobe. 2. Emphysema. 3. Bibasilar atelectasis/scarring, left greater than right. There may be a superimposed faint infiltrate at the left lung base versus chronic parenchymal scarring.     EKG Sinus rhythm. Prolonged PR interval  EEG   Therapy Recommendations  OP PT (vestibular rehab), no OT   Physical Exam General: The patient is alert and cooperative at the time of the examination. Skin: No significant peripheral edema is noted.  Neurologic Exam Mental status: The patient is oriented x 3. Cranial nerves: Facial symmetry is present. Speech is normal, no aphasia or dysarthria is noted. Extraocular movements are full. Visual fields are full. Motor: The patient has good strength in all 4 extremities. Sensory examination: Soft touch sensation is symmetric on the face, arms, and legs. Coordination: The patient has good finger-nose-finger and heel-to-shin bilaterally. Gait and station: The patient has a normal gait. Tandem gait is normal. Romberg is negative. No drift is seen. Reflexes: Deep tendon reflexes are symmetric.   ASSESSMENT Mr. Jesse Nunez is a 73 y.o. male was admitted following an apparent syncopal event at home. The patient gives a several month history of increasing problems with dizziness with standing. He has eliminated Norvasc as one of his blood pressure medications as he has had documented hypotensive events associated with wearing of vision and mental confusion. The patient reports frequent episodes of dizziness with standing. Primary event on this admission was not a TIA, but rather a posttraumatic subarachnoid hemorrhage in  the anterior interhemispheric fissure secondary to a syncopal event associated with orthostatic hypotension. The patient has had a several month history of increasing problems with dizziness while standing, with documented episodes of hypotension associated with blurring of vision and mental confusion. He denies palpitations of the heart. He recently discontinued Norvasc because of the hypotensive events. On aspirin 81 mg daily PTA.   Hypertension, BP normal with BP med on hold  Diabetes, diet controlled  Dyslipidemia, LDL 119  AKI on presentation, f/u underway  Leukocytosis, Developing LLL PNA  Cigarette smoker  History of depression  Hospital day # 3  TREATMENT/PLAN  F/u Carotid Doppler study to complete stroke workup, though no stroke dx  F/u EEG evaluation   OP PT (vestibular rehab)  Will discuss resumption of aspirin 81 mg tomorrow at follow up  SIGNED Burnetta Sabin, MSN, RN, ANVP-BC, ANP-BC, GNP-BC [facility] Pager: [phone] 07/05/2013 6:18 PM   I have personally obtained a history, examined the patient, evaluated imaging results, and formulated the assessment and plan of care. I agree with the above. Post-traumatic small SAH and SDH. Follow up carotid u/s.  Penni Bombard, MD 04/03/5571, 2:20 PM Certified in Neurology, Neurophysiology and Neuroimaging [facility] - Stroke Team  To contact Stroke Continuity provider, please refer to [email or web address] After hours, contact General Neurology

## 2013-07-05 NOTE — Progress Notes (Signed)
[facility] PROGRESS NOTE  Jesse Nunez ALP:[id number] DOB: 1940/07/04 DOA: 07/02/2013 PCP: PROVIDER NOT IN SYSTEM  Assessment/Plan: 73 y.o. year old male with PMH of copd, hypertension, hyperlipidemia, type 2 diabetes, chronic intermittent dizziness, presenting with syncope, dizziness,? CVA, subarachnoid hemorrhage.  -Patient is visiting out of town from [place] to see his sister. Per port, he has had intermittent episodes of dizziness and confusion. Patient had a witnessed episode of syncope earlier today that was proceeded by dizziness.  -Neurology consult. Recommendation for stroke workup. Holding antiplatelet therapy given small area of bifrontal subarachnoid hemorrhage.    1. Vertigo, intermittent paroxysmal positional dizzines; -"+" dix haulpike, suspected BPPV, reproducible positional vertigo';  -cont PT/epley eval; still having  vertigo episodes   2. Syncope, fall likely hypotension related; neuro exam non focal;  -MRI: Posttraumatic subarachnoid hemorrhage in the anterior  interhemispheric fissure. No parenchymal hemorrhage is evident other than a tiny focus of chronic hemorrhage left parietal cortex  -MRI c spine: Multilevel spondylosis. No cervical spine fracture or traumatic  subluxation. No epidural hematoma or cord contusion. The observed CT findings at C2-3, and C3-4 appear to be related to  Facet disease. There is no evidence for ligamentous injury or prevertebral soft tissue swelling.  -echo: LVEF 35-32%, diastolic dysfunction with normal LV filling pressure, mild aortic stenosis, AVA 1.8 cm2; symptoms unlikely unlikely related to AS -tele no arrythmia's; Hold BP meds, IVF, PT eval; pend carotids, EEG  3. Subarachnoid hemorrhage due to fall;  -Hold antiplatelet treatment due to subarachnoid hemm  4. Hypertension: ? Taking too much BP meds;  -BP is WNL without meds; hold BP meds; monitor   5. Diabetes: Sliding-scale insulin. A1c-6.4; cont diet control; Per patient  he is off meds at this time   6. AKI. Stage III on presentation. Unclear if this is baseline. Suspect chronic issue in the setting of hypertension and diabetes. We'll continue to follow. Hydrate and reassess.  7. Hyperlipidemia: Lipid panel. 8. Leukocytosis, cough; underlying COPD; h/o tobacco use;  -CXR: Probable scarring at the lung bases, greater on the left. Comparison with prior radiographs would be informative. Cannot exclude subtle left upper lobe pulmonary nodule -Chest DJ:MEQASTMHD, likely developing pneumonia LLL;  -leukocytosis improved, afebrile; started levofloxacin PO, inhalers as needed   Code Status: full Family Communication:  D/w patient (indicate person spoken with, relationship, and if by phone, the number) Disposition Plan: home 24-48 hrs    Consultants:  Neurology   Procedures:  echo  Antibiotics:  Levofloxacin  (indicate start date, and stop date if known)  HPI/Subjective: alert  Objective: Filed Vitals:   07/05/13 0946  BP: 133/72  Pulse: 67  Temp: 97.6 F (36.4 C)  Resp: 18    Intake/Output Summary (Last 24 hours) at 07/05/13 1013 Last data filed at 07/05/13 0941  Gross per 24 hour  Intake   1140 ml  Output   1400 ml  Net   -260 ml   Filed Weights   07/02/13 2230  Weight: 84.2 kg (185 lb 10 oz)    Exam:   General:  alert  Cardiovascular: s1.s2 rrr  Respiratory: poor ventilation L  Abdomen: soft, nt,nd   Musculoskeletal: no LE edema   Data Reviewed: Basic Metabolic Panel:  Recent Labs Lab 07/02/13 1703 07/02/13 1711 07/02/13 2100 07/04/13 0630  NA 139 141 140 140  K 4.8 4.6 4.7 5.0  CL 101 106 102 103  CO2 20  --  22 20  GLUCOSE 134* 137* 123* 142*  BUN  34* 33* 32* 36*  CREATININE 1.62* 1.80* 1.55* 1.54*  CALCIUM 9.4  --  9.7 9.8   Liver Function Tests:  Recent Labs Lab 07/02/13 1703 07/02/13 2100  AST 21 19  ALT 22 22  ALKPHOS 60 63  BILITOT 0.3 0.4  PROT 7.3 7.5  ALBUMIN 3.8 3.8   No results found  for this basename: LIPASE, AMYLASE,  in the last 168 hours No results found for this basename: AMMONIA,  in the last 168 hours CBC:  Recent Labs Lab 07/02/13 1703 07/02/13 1711 07/04/13 0630 07/05/13 0533  WBC 10.6*  --  18.5* 9.7  NEUTROABS 7.0  --   --   --   HGB 12.3* 13.3 12.6* 12.3*  HCT 36.6* 39.0 37.7* 37.1*  MCV 89.9  --  89.1 89.8  PLT 233  --  253 212   Cardiac Enzymes:  Recent Labs Lab 07/03/13 1100  CKTOTAL 77   BNP (last 3 results) No results found for this basename: PROBNP,  in the last 8760 hours CBG:  Recent Labs Lab 07/03/13 1742 07/04/13 0633 07/04/13 1149 07/04/13 1659 07/05/13 0617  GLUCAP 242* 141* 168* 106* 90    No results found for this or any previous visit (from the past 240 hour(s)).   Studies: Ct Chest Wo Contrast  07/04/2013   CLINICAL DATA:  Pulmonary nodule. Cough. Abnormal chest radiographs.  EXAM: CT CHEST WITHOUT CONTRAST  TECHNIQUE: Multidetector CT imaging of the chest was performed following the standard protocol without IV contrast.  COMPARISON:  07/02/2013  FINDINGS: There is no evidence of the mass or infiltrate in the left upper lobe in the area of concern on chest x-ray. This probably represent a confluence of osseous and vascular structures.  The patient does have diffuse emphysematous changes in both lungs with hyperinflation.  There is a focal area of atelectasis and faint infiltrate or scarring at the left lung base posteriorly. There is slight atelectasis or scarring at the right lung base posteriorly.  Heart size and pulmonary vascularity are normal. Heart size is normal. There is small pericardial fluid collection anterior to the carina, not felt to be significant. No adenopathy. Visualized portion of the upper abdomen is essentially normal. No acute osseous abnormality. Congenital fusion of T11 and T12.  IMPRESSION: 1. No evidence of a mass or infiltrate in the left upper lobe. 2. Emphysema. 3. Bibasilar atelectasis/scarring,  left greater than right. There may be a superimposed faint infiltrate at the left lung base versus chronic parenchymal scarring.   Electronically Signed   By: Rozetta Nunnery M.D.   On: 07/04/2013 15:11    Scheduled Meds: . buPROPion  400 mg Oral q morning - 10a  . colestipol  1 g Oral q morning - 10a  . ferrous sulfate  325 mg Oral Q breakfast  . insulin aspart  0-9 Units Subcutaneous TID WC  . levofloxacin  750 mg Oral Q48H  . multivitamin with minerals  1 tablet Oral Daily  . pantoprazole  80 mg Oral Daily  . venlafaxine XR  300 mg Oral Q breakfast   Continuous Infusions: . sodium chloride 75 mL/hr at 07/04/13 2027    Active Problems:   Syncope   Subarachnoid hemorrhage   Depression   COPD exacerbation    Time spent: >35 minutes     Kinnie Feil  [facility] Pager [phone]. If 7PM-7AM, please contact night-coverage at [email or web address], password [facility] 07/05/2013, 10:13 AM  LOS: 3 days

## 2013-07-05 NOTE — Progress Notes (Signed)
Talked to patient about DCP; pt here visiting his sister from [place], [place] and lives alone; patient stated that he might stay with his sister her in [place] prior to returning home to [place]; CM informed patient that might be a good idea and Outpatient PT can be arranged in [place]; patient is thinking about DCP/ CM to follow up; Mindi Slicker RN,BSN,MHA [phone]

## 2013-07-05 NOTE — Progress Notes (Signed)
Physical Therapy Treatment Patient Details Name: Jesse Nunez MRN: [id number] DOB: [birth year] Today's Date: 07/05/2013    History of Present Illness Jesse Nunez is a 73 y.o. Male with PMH of HTN, hyperlipidemia, DM, and macular degeneration. Pt was admitted 07/02/13 for syncopal episode at home and subsequent L facial droop. Pt reports no memory of fall. CT brain showed trace acute subarachnoid hemorrhage.  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  He reports long history of dizziness and vertigo (>2 years), but was never treated.      PT Comments    Pt more symptomatic from a vestibular standpoint today. Negative testing for horizontal canal (sometimes with treatment of the posterior canal some of the otolith crystals can end up being dumped in the horizontal canals and cause increased symptoms).  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    Follow Up Recommendations  Outpatient PT;Other (comment) (for vestibular rehab near his home in [place])     Equipment Recommendations  None recommended by PT    Recommendations for Other Services   NA     Precautions / Restrictions Precautions Precautions: Fall    Mobility  Bed Mobility Overal bed mobility: Independent                Transfers Overall transfer level: Independent                  Ambulation/Gait Ambulation/Gait assistance: Supervision Ambulation Distance (Feet): 15 Feet Assistive device: None Gait Pattern/deviations: WFL(Within Functional Limits)                       Cognition Arousal/Alertness: Awake/alert Behavior During Therapy: WFL for tasks assessed/performed Overall Cognitive Status: Within Functional Limits for tasks assessed                         General Comments General comments (skin integrity, edema, etc.): Continued vestibular assessment today.  Per pt subjective reports  of increased nausea and increased dizziness symptoms I was worried that our treatment yesterday may have positioned the otolith crystals into the horizontal canal.  The pt had negative roll test bil.  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  With futher visual testing today, we did notice some symptoms with oculomotor testing.  There was a left beating nystagmus with smooth pursuits left of midline. 3-5 beats and not at end range.  Smotth pursuits made him more nauseated.  He also had symptoms with both vertical and horizontal head shaking (again, indicating poor gaze stability.  He did well wtih both vertical and horizontal saccade testing and VOR cancellation testing.  He did seem to have some hearing loss in his left ear compared to his right when grossly tested.  He would benefit from x1 exercises which we will give him next session (he is too nauseated right now at the end of our session today to do them today).  Pt educated at length re: theory behind testing, precautions for safety if this were to happen all of a sudden at home or in the community, and furhter therapy follow up.       Pertinent Vitals/Pain See vitals flow sheet.            PT Goals (current goals can now be found in the care  plan section) Acute Rehab PT Goals Patient Stated Goal: to stop feeling dizzy Progress towards PT goals: Progressing toward goals    Frequency  Min 3X/week    PT Plan Current plan remains appropriate       End of Session   Activity Tolerance: Other (comment) (limited by nausea) Patient left: in chair;with call bell/phone within reach;with family/visitor present     Time: 7579-7282 PT Time Calculation (min): 60 min  Charges:  $Therapeutic Activity: 23-37 mins $Self Care/Home Management: 8-22 $Canalith Rep Proc: 8-22 mins                      Jesse Mian B. [place], [place], DPT [phone]   07/05/2013, 4:36 PM

## 2013-07-06 DIAGNOSIS — I6350 Cerebral infarction due to unspecified occlusion or stenosis of unspecified cerebral artery: Secondary | ICD-10-CM

## 2013-07-06 LAB — GLUCOSE, CAPILLARY
Glucose-Capillary: 101 mg/dL — ABNORMAL HIGH (ref 70–99)
Glucose-Capillary: 100 mg/dL — ABNORMAL HIGH (ref 70–99)

## 2013-07-06 MED ORDER — ALBUTEROL SULFATE HFA 108 (90 BASE) MCG/ACT IN AERS: 2.0000 | INHALATION_SPRAY | Freq: Four times a day (QID) | RESPIRATORY_TRACT | Status: AC | PRN

## 2013-07-06 MED ORDER — COLESTIPOL HCL 1 G PO TABS: 1.0000 g | ORAL_TABLET | Freq: Every morning | ORAL | Status: DC

## 2013-07-06 MED ORDER — FERROUS SULFATE 325 (65 FE) MG PO TBEC: 325.0000 mg | DELAYED_RELEASE_TABLET | Freq: Every day | ORAL | Status: DC

## 2013-07-06 MED ORDER — BUPROPION HCL ER (SR) 200 MG PO TB12
400.0000 mg | ORAL_TABLET | Freq: Every morning | ORAL | Status: DC
Start: 2013-07-06 — End: 2020-04-11

## 2013-07-06 MED ORDER — VENLAFAXINE HCL ER 150 MG PO CP24: 300.0000 mg | ORAL_CAPSULE | Freq: Every day | ORAL | Status: DC

## 2013-07-06 MED ORDER — LEVOFLOXACIN 750 MG PO TABS: 750.0000 mg | ORAL_TABLET | ORAL | Status: DC

## 2013-07-06 NOTE — Discharge Summary (Signed)
Physician Discharge Summary  Jesse Nunez ZOX:[id number] DOB: 08/01/1940 DOA: 07/02/2013  PCP: PROVIDER NOT IN SYSTEM  Admit date: 07/02/2013 Discharge date: 07/06/2013  Time spent: >35 minutes  Recommendations for Outpatient Follow-up:  OP PT/vestuibular rehab at [facility] F/u with PCP in 1 week  Discharge Diagnoses:  Active Problems:   Syncope   Subarachnoid hemorrhage   Depression   COPD exacerbation   Discharge Condition: stable   Diet recommendation: low sodium   Filed Weights   07/02/13 2230  Weight: 84.2 kg (185 lb 10 oz)    History of present illness:  73 y.o. year old male with PMH of copd, hypertension, hyperlipidemia, type 2 diabetes, chronic intermittent dizziness, presenting with syncope, dizziness,? CVA, subarachnoid hemorrhage.  -Patient is visiting out of town from [place] to see his sister. Per port, he has had intermittent episodes of dizziness and confusion. Patient had a witnessed episode of syncope earlier today that was proceeded by dizziness.  -Neurology consult. Recommendation for stroke workup. Holding antiplatelet therapy given small area of bifrontal subarachnoid hemorrhage.   Hospital Course:  1. Vertigo, intermittent paroxysmal positional dizzines;  -"+" dix haulpike, suspected BPPV, reproducible positional vertigo';  -improved; cont PT/[facility] at [facility] as OP;  2. Syncope, fall likely hypotension related; neuro exam non focal;  -MRI: Posttraumatic subarachnoid hemorrhage in the anterior interhemispheric fissure. No parenchymal hemorrhage is evident other than a tiny focus of chronic hemorrhage left parietal cortex  -MRI c spine: Multilevel spondylosis. No cervical spine fracture or traumatic subluxation. No epidural hematoma or cord contusion. The observed CT findings at C2-3, and C3-4 appear to be related to Facet disease. There is no evidence for ligamentous injury or prevertebral soft tissue swelling.  -echo: LVEF 81-19%, diastolic  dysfunction with normal LV filling pressure, mild aortic stenosis, AVA 1.8 cm2; symptoms unlikely unlikely related to AS  -tele no arrythmia's; Hold BP meds, no new syncope; OP follow up  3. Subarachnoid hemorrhage due to fall;  -Hold antiplatelet treatment due to subarachnoid hemm until 7/1; reeval by PCP in 1 week  4. Hypertension: ? Taking too much BP meds;  -BP is WNL without meds; hold BP meds; OP follow up in 1 week   5. Diabetes: Sliding-scale insulin. A1c-6.4; cont diet control; Per patient he is off meds at this time  6. AKI. Stage III on presentation. Unclear if this is baseline. Suspect chronic issue in the setting of hypertension and diabetes 7. Leukocytosis, cough; underlying COPD; h/o tobacco use;  -CXR: Probable scarring at the lung bases, greater on the left. Comparison with prior radiographs would be informative. Cannot exclude subtle left upper lobe pulmonary nodule  -Chest JY:NWGNFAOZH, likely developing pneumonia LLL;  -leukocytosis improved, afebrile; to complete levofloxacin PO as outpatient, inhalers as needed  -repeat imaging recommended in 4 weeks   Procedures: echo Study Conclusions  - Left ventricle: The cavity size was normal. Wall thickness was increased in a pattern of mild LVH. Systolic function was normal. The estimated ejection fraction was in the range of 55% to 60%. Wall motion was normal; there were no regional wall motion abnormalities. Doppler parameters are consistent with abnormal left ventricular relaxation (grade 1 diastolic dysfunction). The E/e&' ratio is <8, suggesting normal LV filling pressure. - Aortic valve: Trileaflet; mildly calcified leaflets. Peak and mean gradients of 25 and 12 mmHg, respectively. Based on an LVOT diameter of 2.4 cm, the calculated AVA is 1.8 cm2, consistent with mild aortic stenosis. There was trivial regurgitation. Valve area (VTI): 1.24  cm^2. Valve area (Vmax): 1.09 cm^2. Valve area (Vmean): 1.8 cm^2. - Mitral  valve: Calcified annulus. There is calcification of the leaflet tips. There was mild regurgitation. - Left atrium: The atrium was normal in size.  Impressions:  - LVEF 32-67%, diastolic dysfunction with normal LV filling pressure, mild aortic stenosis, AVA 1.8 cm2.   (i.e. Studies not automatically included, echos, thoracentesis, etc; not x-rays)  Consultations:   neurology   Discharge Exam: Filed Vitals:   07/06/13 0954  BP: 130/80  Pulse: 82  Temp: 97.7 F (36.5 C)  Resp: 18    General: alert Cardiovascular: s1,s2 rrr Respiratory: CTA BL  Discharge Instructions  Discharge Instructions   Diet - low sodium heart healthy    Complete by:  As directed      Discharge instructions    Complete by:  As directed   Please follow up with physical therapy for vestibular rehab  Please follow up with primary care doctor in 1 week     Increase activity slowly    Complete by:  As directed             Medication List    STOP taking these medications       aspirin EC 81 MG tablet     diltiazem 240 MG 24 hr capsule  Commonly known as:  DILACOR XR     lisinopril 10 MG tablet  Commonly known as:  PRINIVIL,ZESTRIL      TAKE these medications       albuterol 108 (90 BASE) MCG/ACT inhaler  Commonly known as:  PROVENTIL HFA;VENTOLIN HFA  Inhale 2 puffs into the lungs every 6 (six) hours as needed for wheezing or shortness of breath.     buPROPion 200 MG 12 hr tablet  Commonly known as:  WELLBUTRIN SR  Take 2 tablets (400 mg total) by mouth every morning.     colestipol 1 G tablet  Commonly known as:  COLESTID  Take 1 tablet (1 g total) by mouth every morning.     ferrous sulfate 325 (65 FE) MG EC tablet  Take 1 tablet (325 mg total) by mouth daily with breakfast.     levofloxacin 750 MG tablet  Commonly known as:  LEVAQUIN  Take 1 tablet (750 mg total) by mouth every other day.     multivitamin with minerals Tabs tablet  Take 1 tablet by mouth daily.      omeprazole 40 MG capsule  Commonly known as:  PRILOSEC  Take 40 mg by mouth daily.     venlafaxine XR 150 MG 24 hr capsule  Commonly known as:  EFFEXOR-XR  Take 2 capsules (300 mg total) by mouth daily with breakfast.     zolpidem 10 MG tablet  Commonly known as:  AMBIEN  Take 10 mg by mouth at bedtime.       No Known Allergies     Follow-up Information   Follow up with PROVIDER NOT IN SYSTEM.       The results of significant diagnostics from this hospitalization (including imaging, microbiology, ancillary and laboratory) are listed below for reference.    Significant Diagnostic Studies: Dg Chest 2 View  07/02/2013   CLINICAL DATA:  Short of breath / and hypoxemia. Diabetes and hypertension.  EXAM: CHEST  2 VIEW  COMPARISON:  None.  FINDINGS: Mild hyperinflation. Artifact degradation posteriorly on the lateral view.  Midline trachea. Normal heart size and mediastinal contours for age. No pleural effusion or pneumothorax. Favor scarring at  the lung bases, greater on the left. No lobar consolidation. Cannot exclude left upper lobe pulmonary nodule. On the order of 2.0 cm.  IMPRESSION: Hyperinflation, without acute superimposed process or other explanation for shortness of Breath.  Probable scarring at the lung bases, greater on the left. Comparison with prior radiographs would be informative.  Cannot exclude subtle left upper lobe pulmonary nodule. This alternatively could represent summation of osseous shadows. Given the smoking history, chest CT should be considered. If this is not performed, plain film radiographic followup at 3 months should be considered.   Electronically Signed   By: Abigail Miyamoto M.D.   On: 07/02/2013 19:14   Ct Head Wo Contrast  07/02/2013   CLINICAL DATA:  Stroke alert.  Fall.  EXAM: CT HEAD WITHOUT CONTRAST  TECHNIQUE: Contiguous axial images were obtained from the base of the skull through the vertex without intravenous contrast.  COMPARISON:  None.  FINDINGS:  Diffuse prominence of the CSF containing spaces is compatible with generalized cerebral atrophy. Scattered and confluent hypodensity within the periventricular and deep white matter both cerebral hemispheres is most consistent with chronic small vessel ischemic disease.  There is trace hyperdensity within the medial left frontal lobe adjacent to the anterior falx (image 24), suspicious for acute subarachnoid hemorrhage. No significant mass effect. Additional hyperdense foci seen slightly more inferiorly within the parasagittal right frontal lobe may represent small amount of blood as well (series 2, image 21).  No definite acute large vessel territory infarct identified. Wedge-shaped hypodensity within the left cerebellar hemisphere is most compatible with remote infarct. Remote lacunar infarct present within the left basal ganglia.  Prominent vascular calcifications present within the carotid siphons and distal vertebral arteries.  No mass lesion or midline shift. No hydrocephalus. No extra-axial fluid collection.  Air-fluid level present within the left maxillary sinus. Minimal opacity noted within the left sphenoid sinus. Mild mucoperiosteal thickening present within the frontal sinuses bilaterally.  Calvarium is intact. Scalp soft tissues within normal limits. No acute orbital abnormality.  IMPRESSION: 1. Trace acute subarachnoid hemorrhage within the parafalcine bilateral frontal lobes without significant mass effect. Finding is likely posttraumatic related to fall. 2. Age-indeterminate hypodensity within the left temporal lobe, suspicious for acute ischemia. Further evaluation with brain MRI would likely be helpful for further evaluation. 3. 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 4. Generalized cerebral atrophy with advanced chronic microvascular ischemic disease. 5. Left maxillary sinusitis. Critical Value/emergent results were called by telephone at the time of interpretation on 07/02/2013 at  5:24 PM to Dr. Armida Sans , who verbally acknowledged these results.   Electronically Signed   By: Jeannine Boga M.D.   On: 07/02/2013 17:36   Ct Chest Wo Contrast  07/04/2013   CLINICAL DATA:  Pulmonary nodule. Cough. Abnormal chest radiographs.  EXAM: CT CHEST WITHOUT CONTRAST  TECHNIQUE: Multidetector CT imaging of the chest was performed following the standard protocol without IV contrast.  COMPARISON:  07/02/2013  FINDINGS: There is no evidence of the mass or infiltrate in the left upper lobe in the area of concern on chest x-ray. This probably represent a confluence of osseous and vascular structures.  The patient does have diffuse emphysematous changes in both lungs with hyperinflation.  There is a focal area of atelectasis and faint infiltrate or scarring at the left lung base posteriorly. There is slight atelectasis or scarring at the right lung base posteriorly.  Heart size and pulmonary vascularity are normal. Heart size is normal. There is small pericardial  fluid collection anterior to the carina, not felt to be significant. No adenopathy. Visualized portion of the upper abdomen is essentially normal. No acute osseous abnormality. Congenital fusion of T11 and T12.  IMPRESSION: 1. No evidence of a mass or infiltrate in the left upper lobe. 2. Emphysema. 3. Bibasilar atelectasis/scarring, left greater than right. There may be a superimposed faint infiltrate at the left lung base versus chronic parenchymal scarring.   Electronically Signed   By: Rozetta Nunnery M.D.   On: 07/04/2013 15:11   Ct Cervical Spine Wo Contrast  07/02/2013   CLINICAL DATA:  Stroke.  Possible trauma to  EXAM: CT CERVICAL SPINE WITHOUT CONTRAST  TECHNIQUE: Multidetector CT imaging of the cervical spine was performed without intravenous contrast. Multiplanar CT image reconstructions were also generated.  COMPARISON:  Head CT 07/02/2013.  FINDINGS: Shotty cervical lymph nodes are present. Cervical airway is patent. Thyroid is  unremarkable. Pulmonary apices are clear.  Diffuse degenerative changes cervical spine. No evidence of fracture or dislocation. Noted however is 3.6 mm anterior subluxation of C2 on C3 and 5.4 mm anterior subluxation of C3 on C4. Although these changes could be degenerative ligamentous injury cannot be excluded.  IMPRESSION: 1. 3.6 mm anterior subluxation C2 on C3. 2. 5.4 mm anterior subluxation C3 on C4. Although these changes could be secondary to diffuse degenerative change present. Ligamentous injury with subluxation cannot be excluded. 3. No evidence of fracture or dislocation.   Electronically Signed   By: Marcello Moores  Register   On: 07/02/2013 19:17   Mr Jodene Nam Head Wo Contrast  07/02/2013   CLINICAL DATA:  Facial droop after a fall. Left facial weakness and speech difficulty.  EXAM: MRI HEAD WITHOUT CONTRAST  MRA HEAD WITHOUT CONTRAST  TECHNIQUE: Multiplanar, multiecho pulse sequences of the brain and surrounding structures were obtained without intravenous contrast. Angiographic images of the head were obtained using MRA technique without contrast.  COMPARISON:  CT head earlier in the day.  FINDINGS: MRI HEAD FINDINGS  No acute stroke. No mass lesion, hydrocephalus, or extra-axial fluid.  Generalized atrophy. Chronic microvascular ischemic change of a moderate degree. Tiny focus of chronic hemorrhage left parietal cortex. Scattered the [place] throughout the basal ganglia and deep white matter. Dolichoectatic cerebral vasculature with wide patency; right vertebral is either atretic or ends in PICA. 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  There is evidence for acute subarachnoid hemorrhage in the anterior interhemispheric fissure as suspected from CT. Left sylvian fissure subarachnoid blood not definitely present but could be difficult to detect due to the axial plane of FLAIR imaging. No intraventricular hemorrhage or pre pontine hemorrhage. No midline shift. No osseous findings. Air-fluid level left maxillary sinus  felt inflammatory, not posttraumatic. No mastoid fluid. No midline abnormality. No upper cervical lesions. Midline posterior scalp hematoma.  MRA HEAD FINDINGS  The internal carotid arteries are dolichoectatic but widely patent. The basilar artery is solely flow supplied by the left vertebral. These both are widely patent. No proximal stenosis of the anterior, middle, or posterior cerebral arteries. No cerebellar branch occlusion. No intracranial aneurysm.  IMPRESSION: Posttraumatic subarachnoid hemorrhage in the anterior interhemispheric fissure. No parenchymal hemorrhage is evident other than a tiny focus of chronic hemorrhage left parietal cortex.  Atrophy and small vessel disease.  No vascular occlusion or significant stenosis.   Electronically Signed   By: Rolla Flatten M.D.   On: 07/02/2013 20:12   Mr Brain Wo Contrast  07/02/2013   CLINICAL DATA:  Facial droop after a fall. Left facial  weakness and speech difficulty.  EXAM: MRI HEAD WITHOUT CONTRAST  MRA HEAD WITHOUT CONTRAST  TECHNIQUE: Multiplanar, multiecho pulse sequences of the brain and surrounding structures were obtained without intravenous contrast. Angiographic images of the head were obtained using MRA technique without contrast.  COMPARISON:  CT head earlier in the day.  FINDINGS: MRI HEAD FINDINGS  No acute stroke. No mass lesion, hydrocephalus, or extra-axial fluid.  Generalized atrophy. Chronic microvascular ischemic change of a moderate degree. Tiny focus of chronic hemorrhage left parietal cortex. Scattered the [place] throughout the basal ganglia and deep white matter. Dolichoectatic cerebral vasculature with wide patency; right vertebral is either atretic or ends in PICA. 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  There is evidence for acute subarachnoid hemorrhage in the anterior interhemispheric fissure as suspected from CT. Left sylvian fissure subarachnoid blood not definitely present but could be difficult to detect due to the axial plane of  FLAIR imaging. No intraventricular hemorrhage or pre pontine hemorrhage. No midline shift. No osseous findings. Air-fluid level left maxillary sinus felt inflammatory, not posttraumatic. No mastoid fluid. No midline abnormality. No upper cervical lesions. Midline posterior scalp hematoma.  MRA HEAD FINDINGS  The internal carotid arteries are dolichoectatic but widely patent. The basilar artery is solely flow supplied by the left vertebral. These both are widely patent. No proximal stenosis of the anterior, middle, or posterior cerebral arteries. No cerebellar branch occlusion. No intracranial aneurysm.  IMPRESSION: Posttraumatic subarachnoid hemorrhage in the anterior interhemispheric fissure. No parenchymal hemorrhage is evident other than a tiny focus of chronic hemorrhage left parietal cortex.  Atrophy and small vessel disease.  No vascular occlusion or significant stenosis.   Electronically Signed   By: Rolla Flatten M.D.   On: 07/02/2013 20:12   Mr Cervical Spine Wo Contrast  07/02/2013   CLINICAL DATA:  Status post fall. Abnormal CT cervical spine. Neck pain.  EXAM: MRI CERVICAL SPINE WITHOUT CONTRAST  TECHNIQUE: Multiplanar, multisequence MR imaging of the cervical spine was performed. No intravenous contrast was administered.  COMPARISON:  CT cervical spine 07/02/2013.  FINDINGS: Minor anterolisthesis at C3-4 has reduced somewhat compared to the CT, with anatomic alignment at C2-3, C4-5, C5-6, C6-7, and C7-T1 on MR. as seen on MR, anterolisthesis at C3-4 measures no more than 2 mm.  No vertebral body compression deformity, prevertebral soft tissue swelling, ligamentous injury, cord contusion, hematomyelia, or neck mass.  Chronically diseased or hypoplastic/atretic right vertebral. Craniocervical junction unremarkable.  The individual disc spaces were examined as follows:  C2-3:  Unremarkable disc space.  Bilateral facet arthropathy.  C3-4: Central protrusion severe facet arthropathy on the left with  uncinate spurring. Left C4 nerve root impingement is evident.  C4-5:  Central protrusion.  Mild canal stenosis.  No impingement.  C5-6: Moderate disc space narrowing. Central protrusion with bilateral uncinate spurring. Mild central canal stenosis. Bilateral C6 nerve root impingement.  C6-7: Moderate disc space narrowing. Central protrusion with left greater than right uncinate spurring. Bilateral C7 nerve root impingement, worse on the left.  C7-T1:  Unremarkable.  IMPRESSION: Multilevel spondylosis. No cervical spine fracture or traumatic subluxation. No epidural hematoma or cord contusion.  The observed CT findings at C2-3, and C3-4 appear to be related to facet disease. There is no evidence for ligamentous injury or prevertebral soft tissue swelling.   Electronically Signed   By: Rolla Flatten M.D.   On: 07/02/2013 20:35    Microbiology: No results found for this or any previous visit (from the past 240 hour(s)).   Labs:  Basic Metabolic Panel:  Recent Labs Lab 07/02/13 1703 07/02/13 1711 07/02/13 2100 07/04/13 0630  NA 139 141 140 140  K 4.8 4.6 4.7 5.0  CL 101 106 102 103  CO2 20  --  22 20  GLUCOSE 134* 137* 123* 142*  BUN 34* 33* 32* 36*  CREATININE 1.62* 1.80* 1.55* 1.54*  CALCIUM 9.4  --  9.7 9.8   Liver Function Tests:  Recent Labs Lab 07/02/13 1703 07/02/13 2100  AST 21 19  ALT 22 22  ALKPHOS 60 63  BILITOT 0.3 0.4  PROT 7.3 7.5  ALBUMIN 3.8 3.8   No results found for this basename: LIPASE, AMYLASE,  in the last 168 hours No results found for this basename: AMMONIA,  in the last 168 hours CBC:  Recent Labs Lab 07/02/13 1703 07/02/13 1711 07/04/13 0630 07/05/13 0533  WBC 10.6*  --  18.5* 9.7  NEUTROABS 7.0  --   --   --   HGB 12.3* 13.3 12.6* 12.3*  HCT 36.6* 39.0 37.7* 37.1*  MCV 89.9  --  89.1 89.8  PLT 233  --  253 212   Cardiac Enzymes:  Recent Labs Lab 07/03/13 1100  CKTOTAL 77   BNP: BNP (last 3 results) No results found for this basename:  PROBNP,  in the last 8760 hours CBG:  Recent Labs Lab 07/05/13 1154 07/05/13 1652 07/05/13 2058 07/06/13 0644 07/06/13 1132  GLUCAP 133* 96 117* 101* 100*       Signed:  Vipul Cafarelli N  [facility] 07/06/2013, 12:56 PM

## 2013-07-06 NOTE — Progress Notes (Addendum)
*  PRELIMINARY RESULTS* Vascular Ultrasound Carotid Duplex (Doppler) has been completed.  Findings suggest 1-39% internal carotid artery stenosis bilaterally. Vertebral arteries are patent with antegrade flow, however the right vertebral artery exhibits an atypical waveform.  07/06/2013 9:35 AM Maudry Mayhew, RVT, RDCS, RDMS

## 2013-07-06 NOTE — Progress Notes (Signed)
Pt A&O x4; pt discharge education and instructions completed with pt and family at bedside. All voices understanding and denies any questions. All lines including IV and telemetry removed from pt. Pt handed information about stroke along his prescriptions for albuterol, bupropion; colestipol; ferrous sulfate; levaquin and venlafaxine XR. Pt discharge home and family to transport pt off to disposition. Pt transported off unit via wheelchair with family and belongings at side. Francis Gaines Gianny Sabino RN.

## 2013-07-06 NOTE — Progress Notes (Signed)
Physical Therapy Treatment Patient Details Name: Jesse Nunez MRN: [id number] DOB: Dec 31, 1940 Today's Date: 07/06/2013    History of Present Illness Jesse Nunez is a 73 y.o. Male with PMH of HTN, hyperlipidemia, DM, and macular degeneration. Pt was admitted 07/02/13 for syncopal episode at home and subsequent L facial droop. Pt reports no memory of fall. CT brain showed trace acute subarachnoid hemorrhage.  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  He reports long history of dizziness and vertigo (>2 years), but was never treated.      PT Comments    Pt feels much improved today with only mild sense of spinning with bed mobility.  In an attempt to not make him more symptomatic today we just talked about his HEP x1 exercises for gaze stability both vertical and horizontal.  Reviewed with pt and he showed proficiency and handout given with "A" target.  PT will continue to follow acutely if pt still here on Monday 07/09/13.    Follow Up Recommendations  Outpatient PT;Other (comment) (near [facility] where he lives)     Equipment Recommendations  None recommended by PT    Recommendations for Other Services   None     Precautions / Restrictions Precautions Precautions: Fall    Mobility  Bed Mobility Overal bed mobility: Independent                Transfers Overall transfer level: Independent                       Cognition Arousal/Alertness: Awake/alert Behavior During Therapy: WFL for tasks assessed/performed Overall Cognitive Status: Within Functional Limits for tasks assessed                      Exercises Other Exercises Other Exercises: Pt given x1 exercises both vertical and horizontal.  We reviewed them with him and he deomstrated two sets of each direction.  Handout given as well as "A" target    General Comments General comments (skin integrity, edema, etc.): As pt was feeling better today we did not re-test or re-treat  his right posterior canal.  We did check orthostatic vitals to be sure that was not contributing to his sense of dysequilibrium. Orthostatic vitals were negative.        Pertinent Vitals/Pain  07/06/13 1043  Orthostatic Lying   BP- Lying 129/79 mmHg  Pulse- Lying 82  Pain Assessment  Pain Assessment No/denies pain  Pain Score 0  Oxygen Therapy  SpO2 90 %  O2 Device None (Room air)    07/06/13 1045  Orthostatic Sitting  BP- Sitting 114/77 mmHg  Pulse- Sitting 85  Oxygen Therapy  SpO2 94 %  O2 Device None (Room air)    07/06/13 1047  Orthostatic Standing at 0 minutes  BP- Standing at 0 minutes 116/76 mmHg  Pulse- Standing at 0 minutes 95  Oxygen Therapy  SpO2 93 %  O2 Device None (Room air)    07/06/13 1050  Orthostatic Standing at 3 minutes  BP- Standing at 3 minutes 124/84 mmHg  Pulse- Standing at 3 minutes 96  Oxygen Therapy  SpO2 96 %  O2 Device None (Room air)              PT Goals (current goals can now be found in the care plan section) Acute Rehab PT Goals Patient Stated Goal: to stop feeling dizzy Progress towards PT goals: Progressing toward goals  Frequency  Min 3X/week    PT Plan Current plan remains appropriate       End of Session   Activity Tolerance: Patient tolerated treatment well Patient left: in bed;with call bell/phone within reach     Time: 1040-1105 PT Time Calculation (min): 25 min  Charges:  $Therapeutic Exercise: 8-22 mins $Therapeutic Activity: 8-22 mins                      Jesse Nunez, Erskine, DPT [phone]   07/06/2013, 1:13 PM

## 2013-07-06 NOTE — Progress Notes (Signed)
Stroke Team Progress Note  HISTORY Jesse Nunez is an 73 y.o. male with a past medical history significant for HTN, hyperlipidemia, DM, and macular degeneration, who was last seen normal by his wife this AM 07/02/2013 at 0900. She came home and found him with a left facial droop. Per wife he had fallen and then made his way to his couch. Wife told EMS that he patient told he probably fainted and afterwards was disoriented and little bit confused At time of arrival he was out of window for tPA and intervention. initial exam showed only left facial droop. NIHSS 2. CT brain showed trace acute subarachnoid hemorrhage within the parafalcine bilateral frontal lobes without significant mass effect and age-indeterminate hypodensity within the left temporal lobe, suspicious for acute ischemia. Patient is on aspirin 81 mg and he is not sure is he takes plavix. Of importance, he reports chronic dizziness and frequent problems with low blood pressure. Patient was not administered TPA secondary to outside window, possible small post traumatic SAH. He was admitted to the Internal Medicine Service for further evaluation and treatment. Neurology is consulting.   SUBJECTIVE No family at bedside. He lives in an apt  OBJECTIVE Most recent Vital Signs: Filed Vitals:   07/06/13 1043 07/06/13 1045 07/06/13 1047 07/06/13 1050  BP:      Pulse:      Temp:      TempSrc:      Resp:      Height:      Weight:      SpO2: 90% 94% 93% 96%   CBG (last 3)   Recent Labs  07/05/13 1652 07/05/13 2058 07/06/13 0644  GLUCAP 96 117* 101*    IV Fluid Intake:   . sodium chloride 50 mL/hr at 07/05/13 1100    MEDICATIONS  . buPROPion  400 mg Oral q morning - 10a  . colestipol  1 g Oral q morning - 10a  . ferrous sulfate  325 mg Oral Q breakfast  . insulin aspart  0-9 Units Subcutaneous TID WC  . levofloxacin  750 mg Oral Q48H  . multivitamin with minerals  1 tablet Oral Daily  . pantoprazole  80 mg Oral Daily  .  venlafaxine XR  300 mg Oral Q breakfast   PRN:  ipratropium-albuterol, ondansetron, senna-docusate, zolpidem  Diet:  Carb Control thin liquids Activity:   DVT Prophylaxis:  SCDs   CLINICALLY SIGNIFICANT STUDIES Basic Metabolic Panel:   Recent Labs Lab 07/02/13 2100 07/04/13 0630  NA 140 140  K 4.7 5.0  CL 102 103  CO2 22 20  GLUCOSE 123* 142*  BUN 32* 36*  CREATININE 1.55* 1.54*  CALCIUM 9.7 9.8   Liver Function Tests:   Recent Labs Lab 07/02/13 1703 07/02/13 2100  AST 21 19  ALT 22 22  ALKPHOS 60 63  BILITOT 0.3 0.4  PROT 7.3 7.5  ALBUMIN 3.8 3.8   CBC:  Recent Labs Lab 07/02/13 1703  07/04/13 0630 07/05/13 0533  WBC 10.6*  --  18.5* 9.7  NEUTROABS 7.0  --   --   --   HGB 12.3*  < > 12.6* 12.3*  HCT 36.6*  < > 37.7* 37.1*  MCV 89.9  --  89.1 89.8  PLT 233  --  253 212  < > = values in this interval not displayed. Coagulation:   Recent Labs Lab 07/02/13 1703  LABPROT 13.3  INR 1.03   Cardiac Enzymes:   Recent Labs Lab 07/03/13 1100  CKTOTAL  77   Urinalysis:   Recent Labs Lab 07/02/13 1829  COLORURINE AMBER*  LABSPEC 1.030  PHURINE 5.0  GLUCOSEU NEGATIVE  HGBUR NEGATIVE  BILIRUBINUR SMALL*  KETONESUR 15*  PROTEINUR NEGATIVE  UROBILINOGEN 0.2  NITRITE NEGATIVE  LEUKOCYTESUR NEGATIVE   Lipid Panel    Component Value Date/Time   CHOL 227* 07/03/2013 0032   TRIG 354* 07/03/2013 0032   HDL 37* 07/03/2013 0032   CHOLHDL 6.1 07/03/2013 0032   VLDL 71* 07/03/2013 0032   LDLCALC 119* 07/03/2013 0032   HgbA1C  Lab Results  Component Value Date   HGBA1C 6.4* 07/02/2013    Urine Drug Screen:     Component Value Date/Time   LABOPIA NONE DETECTED 07/02/2013 1829   COCAINSCRNUR NONE DETECTED 07/02/2013 1829   LABBENZ NONE DETECTED 07/02/2013 1829   AMPHETMU POSITIVE* 07/02/2013 1829   THCU NONE DETECTED 07/02/2013 1829   LABBARB NONE DETECTED 07/02/2013 1829    Alcohol Level:   Recent Labs Lab 07/02/13 1703  ETH <11     CT Cervical  Spine 07/02/2013    1. 3.6 mm anterior subluxation C2 on C3. 2. 5.4 mm anterior subluxation C3 on C4. Although these changes could be secondary to diffuse degenerative change present. Ligamentous injury with subluxation cannot be excluded. 3. No evidence of fracture or dislocation.     MRI Cervical Spine  07/02/2013     Multilevel spondylosis. No cervical spine fracture or traumatic subluxation. No epidural hematoma or cord contusion.  The observed CT findings at C2-3, and C3-4 appear to be related to facet disease. There is no evidence for ligamentous injury or prevertebral soft tissue swelling.      CT of the brain  07/02/2013   1. Trace acute subarachnoid hemorrhage within the parafalcine bilateral frontal lobes without significant mass effect. Finding is likely posttraumatic related to fall. 2. Age-indeterminate hypodensity within the left temporal lobe, suspicious for acute ischemia. Further evaluation with brain MRI would likely be helpful for further evaluation. 3. 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 4. Generalized cerebral atrophy with advanced chronic microvascular ischemic disease. 5. Left maxillary sinusitis.   MRI of the brain  07/02/2013    Posttraumatic subarachnoid hemorrhage in the anterior interhemispheric fissure. No parenchymal hemorrhage is evident other than a tiny focus of chronic hemorrhage left parietal cortex.  Atrophy and small vessel disease.   MRA of the brain  07/02/2013      No vascular occlusion or significant stenosis.     2D Echocardiogram  EF 55-60% with no source of embolus.   Carotid Doppler  No evidence of hemodynamically significant internal carotid artery stenosis. Vertebral artery flow is antegrade.   CXR  07/02/2013   Hyperinflation, without acute superimposed process or other explanation for shortness of Breath.  Probable scarring at the lung bases, greater on the left. Comparison with prior radiographs would be informative.  Cannot exclude  subtle left upper lobe pulmonary nodule. This alternatively could represent summation of osseous shadows. Given the smoking history, chest CT should be considered. If this is not performed, plain film radiographic followup at 3 months should be considered.     CT Chest  07/04/2013    1. No evidence of a mass or infiltrate in the left upper lobe. 2. Emphysema. 3. Bibasilar atelectasis/scarring, left greater than right. There may be a superimposed faint infiltrate at the left lung base versus chronic parenchymal scarring.     EKG Sinus rhythm. Prolonged PR interval  EEG  Therapy Recommendations  OP PT (vestibular rehab), no OT   Physical Exam General: The patient is alert and cooperative at the time of the examination. Skin: No significant peripheral edema is noted.  Neurologic Exam Mental status: The patient is oriented x 3. Cranial nerves: Facial symmetry is present. Speech is normal, no aphasia or dysarthria is noted. Extraocular movements are full. Visual fields are full. Motor: The patient has good strength in all 4 extremities. Sensory examination: Soft touch sensation is symmetric on the face, arms, and legs. Coordination: The patient has good finger-nose-finger and heel-to-shin bilaterally. Gait and station: The patient has a normal gait. Tandem gait is normal. Romberg is negative. No drift is seen. Reflexes: Deep tendon reflexes are symmetric.   ASSESSMENT Mr. Jesse Nunez is a 73 y.o. male was admitted following an apparent syncopal event at home. The patient gives a several month history of increasing problems with dizziness with standing. He has eliminated Norvasc as one of his blood pressure medications as he has had documented hypotensive events associated with wearing of vision and mental confusion. The patient reports frequent episodes of dizziness with standing. Primary event on this admission was not a TIA, but rather a posttraumatic subarachnoid hemorrhage in the anterior  interhemispheric fissure secondary to a syncopal event associated with orthostatic hypotension. The patient has had a several month history of increasing problems with dizziness while standing, with documented episodes of hypotension associated with blurring of vision and mental confusion. He denies palpitations of the heart. He recently discontinued Norvasc because of the hypotensive events. On aspirin 81 mg daily PTA.   Hypertension, BP normal with BP med on hold  Diabetes, diet controlled  Dyslipidemia, LDL 119  AKI on presentation, medical f/u underway  Leukocytosis, Developing LLL PNA  Cigarette smoker  History of depression  Hospital day # 4  TREATMENT/PLAN  For general health benefits if desired, it is ok to resume aspirin 81 mg on 07/11/2013. There is no neuro indication for prophylactic antiplatelet.  OP PT (vestibular rehab) recommended  No further neuro workup indicated.  Ongoing stroke risk factor control by Primary Care Physician  Stroke Service will sign off. Please call should any needs arise.   No neuro follow-up indicated  SIGNED Burnetta Sabin, MSN, RN, ANVP-BC, ANP-BC, GNP-BC [facility] Pager: [phone] 07/06/2013 11:30 AM   I have personally obtained a history, examined the patient, evaluated imaging results, and formulated the assessment and plan of care. I agree with the above. Post-traumatic small SAH and SDH. May resume aspirin 81mg  daily for general / primary prevention on 07/11/13 if desired. No definite TIA or stroke diagnosis.   Penni Bombard, MD 8/88/2800, 34:91 AM Certified in Neurology, Neurophysiology and Neuroimaging [facility] - Stroke Team  To contact Stroke Continuity provider, please refer to [email or web address] After hours, contact General Neurology

## 2013-07-11 ENCOUNTER — Encounter (HOSPITAL_COMMUNITY): Payer: Self-pay | Admitting: Emergency Medicine

## 2013-07-11 ENCOUNTER — Emergency Department (INDEPENDENT_AMBULATORY_CARE_PROVIDER_SITE_OTHER)
Admission: EM | Admit: 2013-07-11 | Discharge: 2013-07-11 | Disposition: A | Discharge status: Home / Self Care | Payer: MEDICARE | Source: Home / Self Care | Attending: Emergency Medicine | Admitting: Emergency Medicine

## 2013-07-11 DIAGNOSIS — M109 Gout, unspecified: Secondary | ICD-10-CM

## 2013-07-11 MED ORDER — COLCHICINE 0.6 MG PO TABS: ORAL_TABLET | ORAL | Status: DC

## 2013-07-11 NOTE — Discharge Instructions (Signed)
Gout Gout is an inflammatory arthritis caused by a buildup of uric acid crystals in the joints. Uric acid is a chemical that is normally present in the blood. When the level of uric acid in the blood is too high it can form crystals that deposit in your joints and tissues. This causes joint redness, soreness, and swelling (inflammation). Repeat attacks are common. Over time, uric acid crystals can form into masses (tophi) near a joint, destroying bone and causing disfigurement. Gout is treatable and often preventable. CAUSES  The disease begins with elevated levels of uric acid in the blood. Uric acid is produced by your body when it breaks down a naturally found substance called purines. Certain foods you eat, such as meats and fish, contain high amounts of purines. Causes of an elevated uric acid level include:  Being passed down from parent to child (heredity).  Diseases that cause increased uric acid production (such as obesity, psoriasis, and certain cancers).  Excessive alcohol use.  Diet, especially diets rich in meat and seafood.  Medicines, including certain cancer-fighting medicines (chemotherapy), water pills (diuretics), and aspirin.  Chronic kidney disease. The kidneys are no longer able to remove uric acid well.  Problems with metabolism. Conditions strongly associated with gout include:  Obesity.  High blood pressure.  High cholesterol.  Diabetes. Not everyone with elevated uric acid levels gets gout. It is not understood why some people get gout and others do not. Surgery, joint injury, and eating too much of certain foods are some of the factors that can lead to gout attacks. SYMPTOMS   An attack of gout comes on quickly. It causes intense pain with redness, swelling, and warmth in a joint.  Fever can occur.  Often, only one joint is involved. Certain joints are more commonly involved:  Base of the big toe.  Knee.  Ankle.  Wrist.  Finger. Without  treatment, an attack usually goes away in a few days to weeks. Between attacks, you usually will not have symptoms, which is different from many other forms of arthritis. DIAGNOSIS  Your caregiver will suspect gout based on your symptoms and exam. In some cases, tests may be recommended. The tests may include:  Blood tests.  Urine tests.  X-rays.  Joint fluid exam. This exam requires a needle to remove fluid from the joint (arthrocentesis). Using a microscope, gout is confirmed when uric acid crystals are seen in the joint fluid. TREATMENT  There are two phases to gout treatment: treating the sudden onset (acute) attack and preventing attacks (prophylaxis).  Treatment of an Acute Attack.  Medicines are used. These include anti-inflammatory medicines or steroid medicines.  An injection of steroid medicine into the affected joint is sometimes necessary.  The painful joint is rested. Movement can worsen the arthritis.  You may use warm or cold treatments on painful joints, depending which works best for you.  Treatment to Prevent Attacks.  If you suffer from frequent gout attacks, your caregiver may advise preventive medicine. These medicines are started after the acute attack subsides. These medicines either help your kidneys eliminate uric acid from your body or decrease your uric acid production. You may need to stay on these medicines for a very long time.  The early phase of treatment with preventive medicine can be associated with an increase in acute gout attacks. For this reason, during the first few months of treatment, your caregiver may also advise you to take medicines usually used for acute gout treatment. Be sure you  understand your caregiver's directions. Your caregiver may make several adjustments to your medicine dose before these medicines are effective.  Discuss dietary treatment with your caregiver or dietitian. Alcohol and drinks high in sugar and fructose and foods  such as meat, poultry, and seafood can increase uric acid levels. Your caregiver or dietician can advise you on drinks and foods that should be limited. HOME CARE INSTRUCTIONS   Do not take aspirin to relieve pain. This raises uric acid levels.  Only take over-the-counter or prescription medicines for pain, discomfort, or fever as directed by your caregiver.  Rest the joint as much as possible. When in bed, keep sheets and blankets off painful areas.  Keep the affected joint raised (elevated).  Apply warm or cold treatments to painful joints. Use of warm or cold treatments depends on which works best for you.  Use crutches if the painful joint is in your leg.  Drink enough fluids to keep your urine clear or pale yellow. This helps your body get rid of uric acid. Limit alcohol, sugary drinks, and fructose drinks.  Follow your dietary instructions. Pay careful attention to the amount of protein you eat. Your daily diet should emphasize fruits, vegetables, whole grains, and fat-free or low-fat milk products. Discuss the use of coffee, vitamin C, and cherries with your caregiver or dietician. These may be helpful in lowering uric acid levels.  Maintain a healthy body weight. SEEK MEDICAL CARE IF:   You develop diarrhea, vomiting, or any side effects from medicines.  You do not feel better in 24 hours, or you are getting worse. SEEK IMMEDIATE MEDICAL CARE IF:   Your joint becomes suddenly more tender, and you have chills or a fever. MAKE SURE YOU:   Understand these instructions.  Will watch your condition.  Will get help right away if you are not doing well or get worse. Document Released: 12/26/1999 Document Revised: 04/24/2012 Document Reviewed: 08/11/2011 [facility] Patient Information 2015 [place], [place]. This information is not intended to replace advice given to you by your health care provider. Make sure you discuss any questions you have with your health care  provider.  Low-Purine Diet Purines are compounds that affect the level of uric acid in your body. A low-purine diet is a diet that is low in purines. Eating a low-purine diet can prevent the level of uric acid in your body from getting too high and causing gout or kidney stones or both. WHAT DO I NEED TO KNOW ABOUT THIS DIET?  Choose low-purine foods. Examples of low-purine foods are listed in the next section.  Drink plenty of fluids, especially water. Fluids can help remove uric acid from your body. Try to drink 8-16 cups (1.9-3.8 L) a day.  Limit foods high in fat, especially saturated fat, as fat makes it harder for the body to get rid of uric acid. Foods high in saturated fat include pizza, cheese, ice cream, whole milk, fried foods, and gravies. Choose foods that are lower in fat and lean sources of protein. Use olive oil when cooking as it contains healthy fats that are not high in saturated fat.  Limit alcohol. Alcohol interferes with the elimination of uric acid from your body. If you are having a gout attack, avoid all alcohol.  Keep in mind that different people's bodies react differently to different foods. You will probably learn over time which foods do or do not affect you. If you discover that a food tends to cause your gout to flare  up, avoid eating that food. You can more freely enjoy foods that do not cause problems. If you have any questions about a food item, talk to your dietitian or health care provider. WHICH FOODS ARE LOW, MODERATE, AND HIGH IN PURINES? The following is a list of foods that are low, moderate, and high in purines. You can eat any amount of the foods that are low in purines. You may be able to have small amounts of foods that are moderate in purines. Ask your health care provider how much of a food moderate in purines you can have. Avoid foods high in purines. Grains  Foods low in purines: Enriched white bread, pasta, rice, cake, cornbread, popcorn.  Foods  moderate in purines: Whole-grain breads and cereals, wheat germ, bran, oatmeal. Uncooked oatmeal. Dry wheat bran or wheat germ.  Foods high in purines: Pancakes, [place] toast, biscuits, muffins. Vegetables  Foods low in purines: All vegetables, except those that are moderate in purines.  Foods moderate in purines: Asparagus, cauliflower, spinach, mushrooms, green peas. Fruits  All fruits are low in purines. Meats and other Protein Foods  Foods low in purines: Eggs, nuts, peanut butter.  Foods moderate in purines: 80-90% lean beef, lamb, veal, pork, poultry, fish, eggs, peanut butter, nuts. Crab, lobster, oysters, and shrimp. Cooked dried beans, peas, and lentils.  Foods high in purines: Anchovies, sardines, herring, mussels, tuna, codfish, scallops, trout, and haddock. Berniece Salines. Organ meats (such as liver or kidney). Tripe. Game meat. Goose. Sweetbreads. Dairy  All dairy foods are low in purines. Low-fat and fat-free dairy products are best because they are low in saturated fat. Beverages  Drinks low in purines: Water, carbonated beverages, tea, coffee, cocoa.  Drinks moderate in purines: Soft drinks and other drinks sweetened with high-fructose corn syrup. Juices. To find whether a food or drink is sweetened with high-fructose corn syrup, look at the ingredients list.  Drinks high in purines: Alcoholic beverages (such as beer). Condiments  Foods low in purines: Salt, herbs, olives, pickles, relishes, vinegar.  Foods moderate in purines: Butter, margarine, oils, mayonnaise. Fats and Oils  Foods low in purines: All types, except gravies and sauces made with meat.  Foods high in purines: Gravies and sauces made with meat. Other Foods  Foods low in purines: Sugars, sweets, gelatin. Cake. Soups made without meat.  Foods moderate in purines: Meat-based or fish-based soups, broths, or bouillons. Foods and drinks sweetened with high-fructose corn syrup.  Foods high in purines:  High-fat desserts (such as ice cream, cookies, cakes, pies, doughnuts, and chocolate). Contact your dietitian for more information on foods that are not listed here. Document Released: 04/24/2010 Document Revised: 01/02/2013 Document Reviewed: 12/04/2012 [facility] Patient Information 2015 [place], [place]. This information is not intended to replace advice given to you by your health care provider. Make sure you discuss any questions you have with your health care provider.

## 2013-07-11 NOTE — ED Notes (Signed)
Pt c/o gout flare up on left foot x5 days Pain increases w/activity Denies inj/trauma Alert w/no signs of acute distress.

## 2013-07-11 NOTE — ED Provider Notes (Signed)
CSN: [id number]     Arrival date & time 07/11/13  1442 History   First MD Initiated Contact with Patient 07/11/13 1607     Chief Complaint  Patient presents with  . Gout   (Consider location/radiation/quality/duration/timing/severity/associated sxs/prior Treatment) HPI Comments: Presents with gout flare that began 07-08-2013 at base of left great toe.   The history is provided by the patient.    Past Medical History  Diagnosis Date  . Hypertension   . Diabetes   . Hyperlipidemia   . Depression    Past Surgical History  Procedure Laterality Date  . Tonsillectomy    . Appendectomy     Family History  Problem Relation Age of Onset  . Hypertension Mother   . Hypertension Father    History  Substance Use Topics  . Smoking status: Former Research scientist (life sciences)  . Smokeless tobacco: Never Used  . Alcohol Use: No    Review of Systems  All other systems reviewed and are negative.   Allergies  Review of patient's allergies indicates no known allergies.  Home Medications   Prior to Admission medications   Medication Sig Start Date End Date Taking? Authorizing Provider  buPROPion (WELLBUTRIN SR) 200 MG 12 hr tablet Take 2 tablets (400 mg total) by mouth every morning. 07/06/13  Yes Kinnie Feil, MD  ferrous sulfate 325 (65 FE) MG EC tablet Take 1 tablet (325 mg total) by mouth daily with breakfast. 07/06/13  Yes Kinnie Feil, MD  albuterol (PROVENTIL HFA;VENTOLIN HFA) 108 (90 BASE) MCG/ACT inhaler Inhale 2 puffs into the lungs every 6 (six) hours as needed for wheezing or shortness of breath. 07/06/13   Kinnie Feil, MD  colchicine 0.6 MG tablet Take two tabs po now then 1 tab po one hour later for acute gout flare 07/11/13   Lahoma Rocker, PA  colestipol (COLESTID) 1 G tablet Take 1 tablet (1 g total) by mouth every morning. 07/06/13   Kinnie Feil, MD  levofloxacin (LEVAQUIN) 750 MG tablet Take 1 tablet (750 mg total) by mouth every other day. 07/06/13   Kinnie Feil, MD   Multiple Vitamin (MULTIVITAMIN WITH MINERALS) TABS tablet Take 1 tablet by mouth daily.    Historical Provider, MD  omeprazole (PRILOSEC) 40 MG capsule Take 40 mg by mouth daily.    Historical Provider, MD  venlafaxine XR (EFFEXOR-XR) 150 MG 24 hr capsule Take 2 capsules (300 mg total) by mouth daily with breakfast. 07/06/13   Kinnie Feil, MD  zolpidem (AMBIEN) 10 MG tablet Take 10 mg by mouth at bedtime.     Historical Provider, MD   BP 132/81  Pulse 54  Temp(Src) 98.1 F (36.7 C) (Oral)  Resp 16  SpO2 96% Physical Exam  Nursing note and vitals reviewed. Constitutional: He is oriented to person, place, and time. He appears well-developed and well-nourished.  HENT:  Head: Normocephalic and atraumatic.  Eyes: Conjunctivae are normal. No scleral icterus.  Cardiovascular: Normal rate.   Pulmonary/Chest: Effort normal.  Musculoskeletal:  +podagra at base of left great toe with associated tenderness, erythema and mild STS  Neurological: He is alert and oriented to person, place, and time.  Skin: Skin is warm and dry.  Psychiatric: He has a normal mood and affect. His behavior is normal.    ED Course  Procedures (including critical care time) Labs Review Labs Reviewed - No data to display  Imaging Review No results found.   MDM   1. Podagra  Colchicine as prescribed. No NSAIDs prescribed as patient's baseline creatinine is >1.5. PCP follow up if no improvement.    [place], [place] 07/11/13 2232

## 2013-07-13 NOTE — ED Provider Notes (Signed)
Medical screening examination/treatment/procedure(s) were performed by resident physician or non-physician practitioner and as supervising physician I was immediately available for consultation/collaboration.   Pauline Good MD.   Billy Fischer, MD 07/13/13 [phone]

## 2015-07-19 ENCOUNTER — Encounter (HOSPITAL_BASED_OUTPATIENT_CLINIC_OR_DEPARTMENT_OTHER): Payer: Self-pay | Admitting: Emergency Medicine

## 2015-07-19 ENCOUNTER — Emergency Department (HOSPITAL_BASED_OUTPATIENT_CLINIC_OR_DEPARTMENT_OTHER)
Admission: EM | Admit: 2015-07-19 | Discharge: 2015-07-19 | Disposition: A | Discharge status: Home / Self Care | Payer: Medicare Other | Attending: Emergency Medicine | Admitting: Emergency Medicine

## 2015-07-19 ENCOUNTER — Emergency Department (HOSPITAL_BASED_OUTPATIENT_CLINIC_OR_DEPARTMENT_OTHER): Payer: Medicare Other

## 2015-07-19 DIAGNOSIS — E785 Hyperlipidemia, unspecified: Secondary | ICD-10-CM | POA: Insufficient documentation

## 2015-07-19 DIAGNOSIS — M79672 Pain in left foot: Secondary | ICD-10-CM

## 2015-07-19 DIAGNOSIS — E1165 Type 2 diabetes mellitus with hyperglycemia: Secondary | ICD-10-CM | POA: Insufficient documentation

## 2015-07-19 DIAGNOSIS — F329 Major depressive disorder, single episode, unspecified: Secondary | ICD-10-CM | POA: Diagnosis not present

## 2015-07-19 DIAGNOSIS — I10 Essential (primary) hypertension: Secondary | ICD-10-CM | POA: Insufficient documentation

## 2015-07-19 DIAGNOSIS — Z7984 Long term (current) use of oral hypoglycemic drugs: Secondary | ICD-10-CM | POA: Insufficient documentation

## 2015-07-19 DIAGNOSIS — Z79899 Other long term (current) drug therapy: Secondary | ICD-10-CM | POA: Insufficient documentation

## 2015-07-19 DIAGNOSIS — Z87891 Personal history of nicotine dependence: Secondary | ICD-10-CM | POA: Insufficient documentation

## 2015-07-19 LAB — CBG MONITORING, ED: Glucose-Capillary: 179 mg/dL — ABNORMAL HIGH (ref 65–99)

## 2015-07-19 MED ORDER — HYDROCODONE-ACETAMINOPHEN 5-325 MG PO TABS: 1.0000 | ORAL_TABLET | Freq: Four times a day (QID) | ORAL | Status: DC | PRN

## 2015-07-19 MED ORDER — ACETAMINOPHEN 325 MG PO TABS
650.0000 mg | ORAL_TABLET | Freq: Once | ORAL | Status: AC
  Administered 2015-07-19: 650 mg via ORAL
  Filled 2015-07-19: qty 2

## 2015-07-19 NOTE — ED Notes (Signed)
Pt in c/o L foot pain onset last night, no injury. States hx of gout but doesn't feel the same.

## 2015-07-19 NOTE — Discharge Instructions (Signed)
Crutch Use Use crutches as needed to keep weight off of painful foot. Take Tylenol for mild pain or the pain medicine prescribed for bad pain. Don't take Tylenol together with the pain medicine prescribed as it can be dangerous to your liver. You should avoid Advil, Motrin, or the class of drugs known asNSAIDS as past lab work shows that those medicines can do damage to your kidneys. See your primary care physician if having significant pain or difficulty walking in 4 or 5 days. Return if concerned for any reason Crutches are used to take weight off one of your legs or feet when you stand or walk. It is important to use crutches that fit properly. When fitted properly:  Each crutch should be 2-3 finger widths below the armpit.  Your weight should be supported by your hand, and not by resting the armpit on the crutch. RISKS AND COMPLICATIONS Damage to the nerves that extend from your armpit to your hand and arm. To prevent this from happening, make sure your crutches fit properly and do not put pressure on your armpit when using them. HOW TO USE YOUR CRUTCHES If you have been instructed to use partial weight bearing, apply (bear) the amount of weight as your health care provider suggests. Do not bear weight in an amount that causes pain to the area of injury. Walking 1. Step with the crutches. 2. Swing the healthy leg slightly ahead of the crutches. Going Up Steps If there is no handrail: 1. Step up with the healthy leg. 2. Step up with the crutches and injured leg. 3. Continue in this way. If there is a handrail: 1. Hold both crutches in one hand. 2. Place your free hand on the handrail. 3. While putting your weight on your arms, lift your healthy leg to the step. 4. Bring the crutches and the injured leg up to that step. 5. Continue in this way. Going Down Steps Be very careful, as going down stairs with crutches is very challenging. If there is no handrail: 1. Step down with the injured  leg and crutches. 2. Step down with the healthy leg. If there is a handrail: 1. Place your hand on the handrail. 2. Hold both crutches with your free hand. 3. Lower your injured leg and crutch to the step below you. Make sure to keep the crutch tips in the center of the step, never on the edge. 4. Lower your healthy leg to that step. 5. Continue in this way. Standing Up 1. Hold the injured leg forward. 2. Grab the armrest with one hand and the top of the crutches with the other hand. 3. Using these supports, pull yourself up to a standing position. Sitting Down 1. Hold the injured leg forward. 2. Grab the armrest with one hand and the top of the crutches with the other hand. 3. Lower yourself to a sitting position. SEEK MEDICAL CARE IF:  You still feel unsteady on your feet.  You develop new pain, for example in your armpits, back, shoulder, wrist, or hip.  You develop any numbness or tingling. SEEK IMMEDIATE MEDICAL CARE IF:  You fall.   This information is not intended to replace advice given to you by your health care provider. Make sure you discuss any questions you have with your health care provider.   Document Released: 12/26/1999 Document Revised: 01/18/2014 Document Reviewed: 09/04/2012 Elsevier Interactive Patient Education [employer].

## 2015-07-19 NOTE — ED Provider Notes (Addendum)
CSN: [id number]     Arrival date & time 07/19/15  1440 History   First MD Initiated Contact with Patient 07/19/15 1507     Chief Complaint  Patient presents with  . Foot Pain     (Consider location/radiation/quality/duration/timing/severity/associated sxs/prior Treatment) HPI Complains of left foot pain at the dorsum of left foot onset gradually last night. Pain is worse with weightbearing improved with nonweightbearing. He treated himself with ibuprofen with partial relief. Pain is also exacerbated by the sheet being on top of his foot however pain does not feel like gout but he's had in the past and that gout only affects his great toe. He denies injury denies fever denies other associated symptoms Past Medical History  Diagnosis Date  . Hypertension   . Diabetes ([place])   . Hyperlipidemia   . Depression   Gout Past Surgical History  Procedure Laterality Date  . Tonsillectomy    . Appendectomy     Family History  Problem Relation Age of Onset  . Hypertension Mother   . Hypertension Father    Social History  Substance Use Topics  . Smoking status: Former Research scientist (life sciences)  . Smokeless tobacco: Never Used  . Alcohol Use: No    Review of Systems  Musculoskeletal: Positive for arthralgias.       Left foot pain  Allergic/Immunologic: Positive for immunocompromised state.       Diabetic  All other systems reviewed and are negative.     Allergies  Review of patient's allergies indicates no known allergies.  Home Medications   Prior to Admission medications   Medication Sig Start Date End Date Taking? Authorizing Provider  albuterol (PROVENTIL HFA;VENTOLIN HFA) 108 (90 BASE) MCG/ACT inhaler Inhale 2 puffs into the lungs every 6 (six) hours as needed for wheezing or shortness of breath. 07/06/13   Kinnie Feil, MD  buPROPion (WELLBUTRIN SR) 200 MG 12 hr tablet Take 2 tablets (400 mg total) by mouth every morning. 07/06/13   Kinnie Feil, MD  colchicine 0.6 MG tablet Take two  tabs po now then 1 tab po one hour later for acute gout flare 07/11/13   Lutricia Feil, PA  colestipol (COLESTID) 1 G tablet Take 1 tablet (1 g total) by mouth every morning. 07/06/13   Kinnie Feil, MD  ferrous sulfate 325 (65 FE) MG EC tablet Take 1 tablet (325 mg total) by mouth daily with breakfast. 07/06/13   Kinnie Feil, MD  levofloxacin (LEVAQUIN) 750 MG tablet Take 1 tablet (750 mg total) by mouth every other day. 07/06/13   Kinnie Feil, MD  Multiple Vitamin (MULTIVITAMIN WITH MINERALS) TABS tablet Take 1 tablet by mouth daily.    Historical Provider, MD  omeprazole (PRILOSEC) 40 MG capsule Take 40 mg by mouth daily.    Historical Provider, MD  venlafaxine XR (EFFEXOR-XR) 150 MG 24 hr capsule Take 2 capsules (300 mg total) by mouth daily with breakfast. 07/06/13   Kinnie Feil, MD  zolpidem (AMBIEN) 10 MG tablet Take 10 mg by mouth at bedtime.     Historical Provider, MD   BP 137/83 mmHg  Pulse 92  Temp(Src) 98.7 F (37.1 C) (Oral)  Resp 18  Ht 5\' 11"  (1.803 m)  Wt 180 lb (81.647 kg)  BMI 25.12 kg/m2  SpO2 98% Physical Exam  Constitutional: He appears well-developed and well-nourished. No distress.  HENT:  Head: Normocephalic and atraumatic.  Eyes: Conjunctivae are normal. Pupils are equal, round, and reactive to  light.  Neck: Neck supple. No tracheal deviation present. No thyromegaly present.  Cardiovascular: Normal rate and regular rhythm.   No murmur heard. Pulmonary/Chest: Effort normal and breath sounds normal.  Abdominal: Soft. Bowel sounds are normal. He exhibits no distension. There is no tenderness.  Musculoskeletal: Normal range of motion. He exhibits tenderness. He exhibits no edema.  Left lower extremity skin intact foot has mild diffuse tenderness over dorsum. Skin appears normal not cold or hot as compared to contralateral foot. DP and PT pulses 2+ bilaterally. Good capillary refill all other extremities without redness swelling or tenderness  neurovascularly intact  Neurological: He is alert. Coordination normal.  Skin: Skin is warm and dry. No rash noted.  Psychiatric: He has a normal mood and affect.  Nursing note and vitals reviewed.   ED Course  Procedures (including critical care time) Labs Review Labs Reviewed - No data to display  Imaging Review No results found. I have personally reviewed and evaluated these images and lab results as part of my medical decision-making.   EKG Interpretation None     X-ray viewed by me. Pain somewhat improved after Tylenol. He does have pain with weightbearing. Results for orders placed or performed during the hospital encounter of 07/19/15  CBG monitoring, ED  Result Value Ref Range   Glucose-Capillary 179 (H) 65 - 99 mg/dL   Dg Foot Complete Left  07/19/2015  CLINICAL DATA:  Left foot pain since last evening.  No known injury. EXAM: LEFT FOOT - COMPLETE 3+ VIEW COMPARISON:  None. FINDINGS: The joint spaces are maintained. No acute bony findings or significant degenerative changes. Peritendinosis vascular calcifications are noted. IMPRESSION: No acute bony findings or significant degenerative changes. Electronically Signed   By: Marijo Sanes M.D.   On: 07/19/2015 15:50   4:15 PM feels somewhat improved after treatment with Tylenol MDM  No obvious etiology of foot pain. No red hot joint. No signs of trauma, infection or vascular compromise. Final diagnoses:  None  Will be given crutches, prescription Norco. Follow-up with PMD if having significant pain in 4 or 5 days Diagnosis #1left foot pain #2 hyperglycemia     Orlie Dakin, MD 07/19/15 [place], MD 07/20/15 [phone]

## 2020-04-11 ENCOUNTER — Emergency Department (HOSPITAL_COMMUNITY): Payer: Medicare Other

## 2020-04-11 ENCOUNTER — Emergency Department (HOSPITAL_BASED_OUTPATIENT_CLINIC_OR_DEPARTMENT_OTHER): Payer: Medicare Other

## 2020-04-11 ENCOUNTER — Observation Stay (HOSPITAL_BASED_OUTPATIENT_CLINIC_OR_DEPARTMENT_OTHER)
Admission: EM | Admit: 2020-04-11 | Discharge: 2020-04-14 | Disposition: A | Discharge status: Home / Self Care | Payer: Medicare Other | Attending: Internal Medicine | Admitting: Internal Medicine

## 2020-04-11 ENCOUNTER — Encounter (HOSPITAL_BASED_OUTPATIENT_CLINIC_OR_DEPARTMENT_OTHER): Payer: Self-pay | Admitting: *Deleted

## 2020-04-11 ENCOUNTER — Other Ambulatory Visit: Payer: Self-pay

## 2020-04-11 DIAGNOSIS — R627 Adult failure to thrive: Secondary | ICD-10-CM

## 2020-04-11 DIAGNOSIS — Z20822 Contact with and (suspected) exposure to covid-19: Secondary | ICD-10-CM | POA: Diagnosis not present

## 2020-04-11 DIAGNOSIS — I10 Essential (primary) hypertension: Secondary | ICD-10-CM | POA: Insufficient documentation

## 2020-04-11 DIAGNOSIS — E119 Type 2 diabetes mellitus without complications: Secondary | ICD-10-CM | POA: Insufficient documentation

## 2020-04-11 DIAGNOSIS — Z79899 Other long term (current) drug therapy: Secondary | ICD-10-CM | POA: Diagnosis not present

## 2020-04-11 DIAGNOSIS — R531 Weakness: Principal | ICD-10-CM

## 2020-04-11 DIAGNOSIS — W19XXXA Unspecified fall, initial encounter: Secondary | ICD-10-CM | POA: Diagnosis not present

## 2020-04-11 DIAGNOSIS — E876 Hypokalemia: Secondary | ICD-10-CM

## 2020-04-11 DIAGNOSIS — Y92009 Unspecified place in unspecified non-institutional (private) residence as the place of occurrence of the external cause: Secondary | ICD-10-CM

## 2020-04-11 DIAGNOSIS — R262 Difficulty in walking, not elsewhere classified: Secondary | ICD-10-CM

## 2020-04-11 DIAGNOSIS — I4581 Long QT syndrome: Secondary | ICD-10-CM | POA: Insufficient documentation

## 2020-04-11 DIAGNOSIS — J449 Chronic obstructive pulmonary disease, unspecified: Secondary | ICD-10-CM | POA: Diagnosis not present

## 2020-04-11 DIAGNOSIS — R9431 Abnormal electrocardiogram [ECG] [EKG]: Secondary | ICD-10-CM

## 2020-04-11 DIAGNOSIS — B029 Zoster without complications: Secondary | ICD-10-CM

## 2020-04-11 DIAGNOSIS — E860 Dehydration: Secondary | ICD-10-CM | POA: Diagnosis not present

## 2020-04-11 DIAGNOSIS — Z87891 Personal history of nicotine dependence: Secondary | ICD-10-CM | POA: Insufficient documentation

## 2020-04-11 LAB — CBC WITH DIFFERENTIAL/PLATELET
Abs Immature Granulocytes: 0.04 10*3/uL (ref 0.00–0.07)
Basophils Absolute: 0 10*3/uL (ref 0.0–0.1)
Basophils Relative: 0 %
Eosinophils Absolute: 0.2 10*3/uL (ref 0.0–0.5)
Eosinophils Relative: 3 %
HCT: 37.2 % — ABNORMAL LOW (ref 39.0–52.0)
Hemoglobin: 12.6 g/dL — ABNORMAL LOW (ref 13.0–17.0)
Immature Granulocytes: 1 %
Lymphocytes Relative: 19 %
Lymphs Abs: 1.6 10*3/uL (ref 0.7–4.0)
MCH: 29.7 pg (ref 26.0–34.0)
MCHC: 33.9 g/dL (ref 30.0–36.0)
MCV: 87.7 fL (ref 80.0–100.0)
Monocytes Absolute: 0.8 10*3/uL (ref 0.1–1.0)
Monocytes Relative: 9 %
Neutro Abs: 5.7 10*3/uL (ref 1.7–7.7)
Neutrophils Relative %: 68 %
Platelets: 196 10*3/uL (ref 150–400)
RBC: 4.24 MIL/uL (ref 4.22–5.81)
RDW: 13.2 % (ref 11.5–15.5)
WBC: 8.4 10*3/uL (ref 4.0–10.5)
nRBC: 0 % (ref 0.0–0.2)

## 2020-04-11 LAB — BASIC METABOLIC PANEL
Anion gap: 12 (ref 5–15)
BUN: 19 mg/dL (ref 8–23)
CO2: 21 mmol/L — ABNORMAL LOW (ref 22–32)
Calcium: 9.5 mg/dL (ref 8.9–10.3)
Chloride: 106 mmol/L (ref 98–111)
Creatinine, Ser: 1.46 mg/dL — ABNORMAL HIGH (ref 0.61–1.24)
GFR, Estimated: 49 mL/min — ABNORMAL LOW (ref >60)
Glucose, Bld: 109 mg/dL — ABNORMAL HIGH (ref 70–99)
Potassium: 3.4 mmol/L — ABNORMAL LOW (ref 3.5–5.1)
Sodium: 139 mmol/L (ref 135–145)

## 2020-04-11 LAB — SARS CORONAVIRUS 2 (TAT 6-24 HRS): SARS Coronavirus 2: NEGATIVE

## 2020-04-11 LAB — CBG MONITORING, ED: Glucose-Capillary: 111 mg/dL — ABNORMAL HIGH (ref 70–99)

## 2020-04-11 MED ORDER — SODIUM CHLORIDE 0.9 % IV SOLN: Freq: Once | INTRAVENOUS | Status: AC

## 2020-04-11 MED ORDER — POTASSIUM CHLORIDE 10 MEQ/100ML IV SOLN
10.0000 meq | INTRAVENOUS | Status: AC
  Administered 2020-04-11 – 2020-04-12 (×2): 10 meq via INTRAVENOUS
  Filled 2020-04-11 (×2): qty 100

## 2020-04-11 MED ORDER — ALBUTEROL SULFATE HFA 108 (90 BASE) MCG/ACT IN AERS: 2.0000 | INHALATION_SPRAY | Freq: Four times a day (QID) | RESPIRATORY_TRACT | Status: DC | PRN

## 2020-04-11 MED ORDER — PANTOPRAZOLE SODIUM 40 MG PO TBEC: 40.0000 mg | DELAYED_RELEASE_TABLET | Freq: Every day | ORAL | Status: DC

## 2020-04-11 MED ORDER — ADULT MULTIVITAMIN W/MINERALS CH: 1.0000 | ORAL_TABLET | Freq: Every day | ORAL | Status: DC

## 2020-04-11 MED ORDER — ENSURE ENLIVE PO LIQD
237.0000 mL | Freq: Two times a day (BID) | ORAL | Status: DC
  Administered 2020-04-12 – 2020-04-14 (×3): 237 mL via ORAL
  Filled 2020-04-11: qty 237

## 2020-04-11 MED ORDER — ALBUTEROL SULFATE (2.5 MG/3ML) 0.083% IN NEBU: 2.5000 mg | INHALATION_SOLUTION | Freq: Four times a day (QID) | RESPIRATORY_TRACT | Status: DC | PRN

## 2020-04-11 MED ORDER — FERROUS SULFATE 325 (65 FE) MG PO TABS: 325.0000 mg | ORAL_TABLET | Freq: Every day | ORAL | Status: DC

## 2020-04-11 MED ORDER — COLESTIPOL HCL 1 G PO TABS: 1.0000 g | ORAL_TABLET | Freq: Every morning | ORAL | Status: DC

## 2020-04-11 MED ORDER — ENOXAPARIN SODIUM 40 MG/0.4ML ~~LOC~~ SOLN
40.0000 mg | SUBCUTANEOUS | Status: DC
  Administered 2020-04-11 – 2020-04-13 (×3): 40 mg via SUBCUTANEOUS
  Filled 2020-04-11 (×3): qty 0.4

## 2020-04-11 NOTE — ED Provider Notes (Signed)
[facility] DE[facility] Provider Note   CSN: [id number] Arrival date & time: 04/11/20  1155     History Chief Complaint  Patient presents with  . Weakness    Jesse Nunez is a 80 y.o. male.  Pt is a 81y/o male with hx of COPD, type 2 diabetes mellitus, hypertension, and multiple prior strokes who is presenting today with his sister-in-law due to generalized weakness and inability to walk.  Based on outside records on 24 March patient was seen for severe left-sided flank pain that ended up resulting in shingles.  He had been on valacyclovir and pain medication and went to an outside emergency room in [facility] yesterday due to confusion.  Patient had reported 1 week of gradually worsening confusion which he described as forgetting what time of day it was and feeling fatigued.  Also home health nurse had reported that patient had significant decrease in appetite and was having difficulty getting around.  He usually uses a Rollator to ambulate around his home and lives alone.  He had lab work done yesterday at the emergency room including a CBC, CMP, thyroid, troponin, EKG, chest x-ray and a urine all that showed no acute findings.  It was agreed that patient was not safe to be discharged home and was discharged with his sister-in-law who he was going to stay with until an assisted living facility had room.  However sister-in-law reports that when they got to the house last night patient was unable to get out of the car and has not been able to walk.  Patient states he cannot walk because he is just generally weak.  He denies any current pain and reports he is eating very little.  He denies any urinary complaints.  No headache, chest pain or shortness of breath.  The history is provided by the patient.  Weakness      Past Medical History:  Diagnosis Date  . Depression   . Diabetes ([place])   . Hyperlipidemia   . Hypertension     Patient Active Problem List   Diagnosis  Date Noted  . Subarachnoid hemorrhage ([place]) 07/03/2013  . Depression 07/03/2013  . COPD exacerbation ([place]) 07/03/2013  . Syncope 07/02/2013    Past Surgical History:  Procedure Laterality Date  . APPENDECTOMY    . TONSILLECTOMY         Family History  Problem Relation Age of Onset  . Hypertension Mother   . Hypertension Father     Social History   Tobacco Use  . Smoking status: Former Research scientist (life sciences)  . Smokeless tobacco: Never Used  Substance Use Topics  . Alcohol use: No  . Drug use: No    Home Medications Prior to Admission medications   Medication Sig Start Date End Date Taking? Authorizing Provider  ferrous sulfate 325 (65 FE) MG EC tablet Take 1 tablet (325 mg total) by mouth daily with breakfast. 07/06/13  Yes Buriev, Arie Sabina, MD  albuterol (PROVENTIL HFA;VENTOLIN HFA) 108 (90 BASE) MCG/ACT inhaler Inhale 2 puffs into the lungs every 6 (six) hours as needed for wheezing or shortness of breath. 07/06/13   Kinnie Feil, MD  colchicine 0.6 MG tablet Take two tabs po now then 1 tab po one hour later for acute gout flare 07/11/13   Presson, Audelia Hives, PA  colestipol (COLESTID) 1 G tablet Take 1 tablet (1 g total) by mouth every morning. 07/06/13   Kinnie Feil, MD  Multiple Vitamin (MULTIVITAMIN WITH MINERALS) TABS tablet  Take 1 tablet by mouth daily.    [provider]  omeprazole (PRILOSEC) 40 MG capsule Take 40 mg by mouth daily.    [provider]  venlafaxine XR (EFFEXOR-XR) 150 MG 24 hr capsule Take 2 capsules (300 mg total) by mouth daily with breakfast. 07/06/13   Daleen Bo, Arie Sabina, MD  zolpidem (AMBIEN) 10 MG tablet Take 10 mg by mouth at bedtime.     [provider]    Allergies    Patient has no known allergies.  Review of Systems   Review of Systems  Neurological: Positive for weakness.  All other systems reviewed and are negative.   Physical Exam Updated Vital Signs BP (!) 161/93 (BP Location: Right Arm)   Pulse 80    Temp 98.3 F (36.8 C) (Oral)   Resp 16   Ht 5\' 11"  (1.803 m)   Wt 82.6 kg   SpO2 96%   BMI 25.41 kg/m   Physical Exam Vitals and nursing note reviewed.  Constitutional:      General: He is not in acute distress.    Appearance: He is well-developed.  HENT:     Head: Normocephalic and atraumatic.  Eyes:     Conjunctiva/sclera: Conjunctivae normal.     Pupils: Pupils are equal, round, and reactive to light.  Cardiovascular:     Rate and Rhythm: Normal rate and regular rhythm.     Pulses: Normal pulses.     Heart sounds: No murmur heard.   Pulmonary:     Effort: Pulmonary effort is normal. No respiratory distress.     Breath sounds: Normal breath sounds. No wheezing or rales.  Abdominal:     General: There is no distension.     Palpations: Abdomen is soft.     Tenderness: There is no abdominal tenderness. There is no guarding or rebound.     Comments: Healing scabbed shingles rash over the left flank  Musculoskeletal:        General: No tenderness. Normal range of motion.     Cervical back: Normal range of motion and neck supple.     Right lower leg: No edema.     Left lower leg: No edema.  Skin:    General: Skin is warm and dry.     Findings: No erythema or rash.  Neurological:     Mental Status: He is alert and oriented to person, place, and time.     Motor: Weakness present.     Comments: 4/5 strength in the bilateral lower ext but slightly weaker on the left leg and arm but no pronator drift.  Speech is fluent without aphasia or slurring.  No facial droop.  Unable to stand independently or walk  Psychiatric:        Mood and Affect: Mood normal.        Behavior: Behavior normal.        Thought Content: Thought content normal.     ED Results / Procedures / Treatments   Labs (all labs ordered are listed, but only abnormal results are displayed) Labs Reviewed  CBG MONITORING, ED - Abnormal; Notable for the following components:      Result Value    Glucose-Capillary 111 (*)    All other components within normal limits  CBC WITH DIFFERENTIAL/PLATELET  BASIC METABOLIC PANEL    EKG None ED ECG REPORT   Date: 04/11/2020  Rate: 87  Rhythm: premature ventricular contractions (PVC)  QRS Axis: normal  Intervals: PR shortened  ST/T Wave abnormalities: nonspecific ST changes  Conduction Disutrbances:none  Narrative Interpretation:   Old EKG Reviewed: changes noted  I have personally reviewed the EKG tracing and agree with the computerized printout as noted.  Radiology CT Head Wo Contrast  Result Date: 04/11/2020 CLINICAL DATA:  Stroke suspected. Additional provided: Patient unable to stand. EXAM: CT HEAD WITHOUT CONTRAST TECHNIQUE: Contiguous axial images were obtained from the base of the skull through the vertex without intravenous contrast. COMPARISON:  MRI/MRA head 07/02/2013. CT head 07/02/2013. FINDINGS: Brain: Moderate cerebral atrophy.  Comparatively mild cerebellar atrophy. There is a cortical/subcortical infarcts within the left parietooccipital lobes which is new as compared to the brain MRI of 07/02/2013, but appears chronic. Similarly, there is a cortical/subcortical infarct within the medial right temporal lobe which is new as compared to the prior MRI, but appears chronic. Suspected additional small chronic cortical infarct within the mid to posterior left frontal lobe (series 2, images 23 and 24). Chronic lacunar infarcts within the bilateral basal ganglia, left thalamus and left thalamocapsular junction. The basal ganglia lacunar infarcts are more numerous as compared to the prior brain MRI. Additionally, left thalamocapsular junction lacunar infarct is new from this prior exam. Background moderate patchy and ill-defined hypoattenuation within the cerebral white matter which is nonspecific, but compatible with chronic small vessel ischemic disease. Redemonstrated chronic infarcts within the bilateral cerebellar hemispheres. There  is no acute intracranial hemorrhage. No acute demarcated cortical infarct is identified. No extra-axial fluid collection. No evidence of intracranial mass. No midline shift. Vascular: No hyperdense vessel.  Atherosclerotic calcifications. Skull: Normal. Negative for fracture or focal lesion. Sinuses/Orbits: Visualized orbits show no acute finding. Mild bilateral ethmoid sinus mucosal thickening. Frothy secretions within the left sphenoid sinus. Small mucous retention cysts within the imaged bilateral maxillary sinuses. IMPRESSION: No CT evidence of acute intracranial abnormality. Cortical/subcortical infarcts within the left parietooccipital lobes and medial right temporal lobe are new from the brain MRI of 07/02/2013, but appear chronic. Suspected additional small chronic cortical infarct within the left frontal lobe, also new from the prior MRI. Chronic lacunar infarcts within the bilateral basal ganglia, left thalamus and left thalamocapsular junction, some of which are new from the prior brain MRI. Progressive moderate cerebral atrophy and background moderate cerebral white matter chronic small vessel ischemic disease. Redemonstrated chronic infarcts within the bilateral cerebellar hemispheres. Paranasal sinus disease as described. Correlate for acute sinusitis. Electronically Signed   By: Kellie Simmering DO   On: 04/11/2020 12:54    Procedures Procedures   Medications Ordered in ED Medications - No data to display  ED Course  I have reviewed the triage vital signs and the nursing notes.  Pertinent labs & imaging results that were available during my care of the patient were reviewed by me and considered in my medical decision making (see chart for details).    MDM Rules/Calculators/A&P                          Patient presenting with his family member due to generalized weakness and inability to ambulate.  Patient was discharged from an outside emergency room yesterday after he was evaluated for  confusion but had relatively benign work-up.  Patient plan was to go to an assisted living facility but was going to stay with a family member until that was available.  However he was not able to get out of the car and they had to have EMS even get him into the  house.  Prior to having shingles a week ago patient was able to ambulate with a Rollator.  On exam he is in no acute distress.  He does have global weakness but seems to be slightly worse on the left with a history of having stroke in the past.  Patient did not have any brain imaging when seen yesterday.  EKG today without acute findings.  Will repeat BMP as patient's creatinine was slightly elevated yesterday at 1.62 from his baseline of 1.4.  We will get collateral information from family.  1:41 PM Since patient went home with family member yesterday he has had 5 falls.  He was unable to go up the steps to get in the house and after in the house fell multiple times from the bed to the floor even when trying to stand with urinating.  This is new within the last 36 hours per the family member.  Patient's CBC and BMP here are unchanged.  Head CT does show no evidence of acute intercranial abnormality however does show multiple areas of new infarct since MRI in 2015.  No evidence of bleed but family member reports that he may have hit his head.  However given multiple new areas of infarct since last MRI feel that patient needs an MRI to ensure no acute process.  If that is negative then he will need evaluation by the transitional care team for rehab placement as well as PT evaluation.  MDM Number of Diagnoses or Management Options   Amount and/or Complexity of Data Reviewed Clinical lab tests: ordered and reviewed Tests in the radiology section of CPT: ordered and reviewed Tests in the medicine section of CPT: ordered and reviewed Decide to obtain previous medical records or to obtain history from someone other than the patient: yes Obtain  history from someone other than the patient: yes Review and summarize past medical records: yes Discuss the patient with other providers: yes Independent visualization of images, tracings, or specimens: yes  Risk of Complications, Morbidity, and/or Mortality Presenting problems: moderate Diagnostic procedures: moderate Management options: moderate  Patient Progress Patient progress: stable    Final Clinical Impression(s) / ED Diagnoses Final diagnoses:  Weakness  Unable to walk    Rx / DC Orders ED Discharge Orders    None       Blanchie Dessert, MD 04/11/20 1345

## 2020-04-11 NOTE — H&P (Addendum)
History and Physical  Jesse Nunez ZTI:[id number] DOB: [birth year] DOA: 04/11/2020  Referring physician: Malvin Johns PCP: System, Provider Not In  Patient coming from: Home  Chief Complaint: Generalized weakness  HPI: Jesse Nunez is a 80 y.o. male with medical history significant for COPD, T2DM, hypertension prior strokes who presents to [facility] ED accompanied by sister-in-law due to generalized weakness and difficulty ambulating.  Patient was somnolent, though easily arousable.  History was obtained from sister-in-law at bedside and ED physician.  Per report, patient lives alone in [place], he has a home health nurse aide who reported that he has had a recent decrease in appetite and decreased ambulation (uses a Rollator to ambulate on his own).  He was recently seen on 3/24 for abdominal rash with extension to left flank which resulted in shingles, he was started on valacyclovir and pain medication, patient was reported to have been having increasing confusion within the last week, he presented to [facility] emergency department yesterday due to confusion, lab work done (CBC, CMP, thyroid, troponin, EKG, chest x-ray and urinalysis) showed no acute findings.  Patient was discharged home with sister-in-law whom he will stay with pending being able to go to an assisted living facility.  Unfortunately, sister-in-law noted that patient was unable to get out of the car and had difficulty in ambulation due to extreme weakness.  Overnight, patient had about 5 falls and she had to call the fire department several times since she was unable to help him get off the floor.  EMS was activated and patient was taken to the ED for further evaluation and management.  ED Course:  In the emergency department, patient was hemodynamically stable with O2 sat of 93-98% on room air.  Work-up in the ED showed normocytic anemia, mild hypokalemia, creatinine of 1.46 (no recent labs for comparison). MRI of brain without  contrast showed 1.  No acute intracranial normality 2.Progressive atrophy and white matter disease. This likely reflects the sequela of chronic microvascular ischemia. 3. 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  CT of head without contrast showed no acute intracranial normality Hospitalist was asked to admit patient for further evaluation and management.  Review of Systems: Constitutional: Negative for chills and fever.  HENT: Negative for ear pain and sore throat.   Eyes: Negative for pain and visual disturbance.  Respiratory: Negative for cough, chest tightness and shortness of breath.   Cardiovascular: Negative for chest pain and palpitations.  Gastrointestinal: Negative for abdominal pain and vomiting.  Endocrine: Negative for polyphagia and polyuria.  Genitourinary: Negative for decreased urine volume, dysuria, enuresis Musculoskeletal: Negative for arthralgias and back pain.  Skin: Positive for skin rash due to shingles. Allergic/Immunologic: Negative for immunocompromised state.  Neurological: Positive for generalized weakness.  Negative for tremors, syncope, speech difficulty hematological: Does not bruise/bleed easily.  All other systems reviewed and are negative   Past Medical History:  Diagnosis Date  . Depression   . Diabetes ([place])   . Hyperlipidemia   . Hypertension    Past Surgical History:  Procedure Laterality Date  . APPENDECTOMY    . TONSILLECTOMY      Social History:  reports that he has quit smoking. He has never used smokeless tobacco. He reports that he does not drink alcohol and does not use drugs.   Allergies  Allergen Reactions  . Glipizide Nausea And Vomiting    Pt reported it being a new  prescription and took 1 dose for 01/05/20 and had several episodes of vomiting. Pt went to the ER.    Family History  Problem Relation Age of Onset  . Hypertension  Mother   . Hypertension Father     Prior to Admission medications   Medication Sig Start Date End Date Taking? Authorizing Provider  ferrous sulfate 325 (65 FE) MG EC tablet Take 1 tablet (325 mg total) by mouth daily with breakfast. 07/06/13  Yes Buriev, Arie Sabina, MD  Multiple Vitamin (MULTIVITAMIN WITH MINERALS) TABS tablet Take 1 tablet by mouth daily.   Yes [provider]  omeprazole (PRILOSEC) 40 MG capsule Take 40 mg by mouth daily.   Yes [provider]  zolpidem (AMBIEN) 10 MG tablet Take 10 mg by mouth at bedtime.    Yes [provider]  albuterol (PROVENTIL HFA;VENTOLIN HFA) 108 (90 BASE) MCG/ACT inhaler Inhale 2 puffs into the lungs every 6 (six) hours as needed for wheezing or shortness of breath. 07/06/13   Kinnie Feil, MD  colchicine 0.6 MG tablet Take two tabs po now then 1 tab po one hour later for acute gout flare 07/11/13   Presson, Audelia Hives, PA  colestipol (COLESTID) 1 G tablet Take 1 tablet (1 g total) by mouth every morning. 07/06/13   Kinnie Feil, MD    Physical Exam: BP (!) 161/82   Pulse 78   Temp 97.9 F (36.6 C) (Oral)   Resp 19   Ht 5\' 11"  (1.803 m)   Wt 82.6 kg   SpO2 97%   BMI 25.41 kg/m   . General: 80 y.o. year-old male ill-appearing but in no acute distress.  Somnolent, but easily arousable and oriented x3. Marland Kitchen HEENT: Dry mucous membrane.  NCAT, EOMI . Neck: Supple, trachea medial . Cardiovascular: Regular rate and rhythm with no rubs or gallops.  No thyromegaly or JVD noted.  No lower extremity edema. 2/4 pulses in all 4 extremities. Marland Kitchen Respiratory: Clear to auscultation with no wheezes or rales. Good inspiratory effort. . Abdomen: Soft nontender nondistended with normal bowel sounds x4 quadrants. . Muskuloskeletal: No cyanosis, clubbing or edema noted bilaterally . Neuro: No focal neurological deficit.  Strength 4/5 bilaterally in lower extremities., sensation, reflexes intact. . Skin: Healing shingles rash  noted over left flank.   Marland Kitchen Psychiatry: Mood is appropriate for condition and setting          Labs on Admission:  Basic Metabolic Panel: Recent Labs  Lab 04/11/20 1217  NA 139  K 3.4*  CL 106  CO2 21*  GLUCOSE 109*  BUN 19  CREATININE 1.46*  CALCIUM 9.5   Liver Function Tests: No results for input(s): AST, ALT, ALKPHOS, BILITOT, PROT, ALBUMIN in the last 168 hours. No results for input(s): LIPASE, AMYLASE in the last 168 hours. No results for input(s): AMMONIA in the last 168 hours. CBC: Recent Labs  Lab 04/11/20 1217  WBC 8.4  NEUTROABS 5.7  HGB 12.6*  HCT 37.2*  MCV 87.7  PLT 196   Cardiac Enzymes: No results for input(s): CKTOTAL, CKMB, CKMBINDEX, TROPONINI in the last 168 hours.  BNP (last 3 results) No results for input(s): BNP in the last 8760 hours.  ProBNP (last 3 results) No results for input(s): PROBNP in the last 8760 hours.  CBG: Recent Labs  Lab 04/11/20 1201  GLUCAP 111*    Radiological Exams on Admission: CT Head Wo Contrast  Result Date: 04/11/2020 CLINICAL DATA:  Stroke suspected. Additional provided:  Patient unable to stand. EXAM: CT HEAD WITHOUT CONTRAST TECHNIQUE: Contiguous axial images were obtained from the base of the skull through the vertex without intravenous contrast. COMPARISON:  MRI/MRA head 07/02/2013. CT head 07/02/2013. FINDINGS: Brain: Moderate cerebral atrophy.  Comparatively mild cerebellar atrophy. There is a cortical/subcortical infarcts within the left parietooccipital lobes which is new as compared to the brain MRI of 07/02/2013, but appears chronic. Similarly, there is a cortical/subcortical infarct within the medial right temporal lobe which is new as compared to the prior MRI, but appears chronic. Suspected additional small chronic cortical infarct within the mid to posterior left frontal lobe (series 2, images 23 and 24). Chronic lacunar infarcts within the bilateral basal ganglia, left thalamus and left thalamocapsular  junction. The basal ganglia lacunar infarcts are more numerous as compared to the prior brain MRI. Additionally, left thalamocapsular junction lacunar infarct is new from this prior exam. Background moderate patchy and ill-defined hypoattenuation within the cerebral white matter which is nonspecific, but compatible with chronic small vessel ischemic disease. Redemonstrated chronic infarcts within the bilateral cerebellar hemispheres. There is no acute intracranial hemorrhage. No acute demarcated cortical infarct is identified. No extra-axial fluid collection. No evidence of intracranial mass. No midline shift. Vascular: No hyperdense vessel.  Atherosclerotic calcifications. Skull: Normal. Negative for fracture or focal lesion. Sinuses/Orbits: Visualized orbits show no acute finding. Mild bilateral ethmoid sinus mucosal thickening. Frothy secretions within the left sphenoid sinus. Small mucous retention cysts within the imaged bilateral maxillary sinuses. IMPRESSION: No CT evidence of acute intracranial abnormality. Cortical/subcortical infarcts within the left parietooccipital lobes and medial right temporal lobe are new from the brain MRI of 07/02/2013, but appear chronic. Suspected additional small chronic cortical infarct within the left frontal lobe, also new from the prior MRI. Chronic lacunar infarcts within the bilateral basal ganglia, left thalamus and left thalamocapsular junction, some of which are new from the prior brain MRI. Progressive moderate cerebral atrophy and background moderate cerebral white matter chronic small vessel ischemic disease. Redemonstrated chronic infarcts within the bilateral cerebellar hemispheres. Paranasal sinus disease as described. Correlate for acute sinusitis. Electronically Signed   By: Kellie Simmering DO   On: 04/11/2020 12:54   MR BRAIN WO CONTRAST  Result Date: 04/11/2020 CLINICAL DATA:  Neuro deficit.  Acute stroke suspected. EXAM: MRI HEAD WITHOUT CONTRAST TECHNIQUE:  Multiplanar, multiecho pulse sequences of the brain and surrounding structures were obtained without intravenous contrast. COMPARISON:  CT head without contrast 04/11/2020. MR head without contrast 07/02/2013. FINDINGS: Brain: Diffusion-weighted images demonstrate no acute or subacute infarction. Acute hemorrhage or mass lesion is present. Periventricular and subcortical T2 hyperintensities have increased since prior exam. Lacunar infarcts of the corona radiata bilaterally are new since the prior study but not acute. Cortical infarct of the left occipital lobe is new the prior exam but not acute. Progressive generalized atrophy is present. Bilateral remote thalamic infarcts are new since prior study, but not acute. White matter generation extends into the brainstem on the right. White matter changes are again noted in the pons. Increased number of remote lacunar infarcts are present within the cerebellum bilaterally. Vascular: Flow is present in the major intracranial arteries. Skull and upper cervical spine: The craniocervical junction is normal. Upper cervical spine is within normal limits. Marrow signal is unremarkable. Sinuses/Orbits: The paranasal sinuses and mastoid air cells are clear. Scratched at a polyp or mucous retention cyst is present in the left maxillary sinus. Mild mucosal thickening is present the anterior ethmoid air cells and frontal sinuses.  No fluid levels are present. The globes and orbits are within normal limits. Other: IMPRESSION: 1. No acute intracranial abnormality. 2. Progressive atrophy and white matter disease. This likely reflects the sequela of chronic microvascular ischemia. 3. 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 Electronically Signed   By: San Morelle M.D.   On: 04/11/2020 18:26    EKG: I independently viewed the EKG done and my findings are as followed: Normal sinus  rhythm at a rate of 83 bpm with prolonged QTc (509 ms)  Assessment/Plan Present on Admission: **None**  Principal Problem:   Generalized weakness Active Problems:   Failure to thrive in adult   Hypokalemia   Dehydration   Fall at home, initial encounter   Shingles   Prolonged QT interval   Generalized weakness with recurrent falls Failure to thrive in adult Patient presents with generalized weakness, this may be related to reported decreased food intake and decreased ambulation. Continue fall precaution and neurochecks Continue PT/OT eval and treat Oral supplements will be provided Patient may require SNF/ALF on discharge  Hypokalemia K+ 3.4; this will be replenished  Dehydration Continue IV hydration  Shingles Patient endorsed improvement Patient operant confusion appeared to was seen after patient started valacyclovir, this will be held at this time.  Prolonged QTc (509 ms) Avoid QT prolonging drugs Magnesium level will be checked Repeat EKG in the morning  Hyperlipidemia Continue Lipitor  Hypertension Continue lisinopril  COPD Continue albuterol   DVT prophylaxis: Lovenox  Code Status: DNR (this was confirmed with patient and with sister-in-law at bedside as a witness)  Family Communication: Sister-in-law (all questions answered to satisfaction)  Disposition Plan:  Patient is from:                        home Anticipated DC to:                   SNF or family members home Anticipated DC date:               1 day Anticipated DC barriers:           Patient requires inpatient management to evaluate generalized weakness with recurrent falls   Consults called: None  Admission status: Observation    Bernadette Hoit MD [facility] 04/12/2020, 1:22 AM

## 2020-04-11 NOTE — TOC Initial Note (Addendum)
Transition of Care [facility]) - Initial/Assessment Note    Patient Details  Name: Jesse Nunez MRN: [id number] Date of Birth: 1940/01/24  [facility]) CM/SW Contact:    Jesse Rasher, RN Phone Number: [phone] 04/11/2020, 2:06 PM  Clinical Narrative:                  TOC CM spoke to pt's sister, Jesse Nunez and states he was living at home alone and needs SNF rehab. Gave permission to create FL2 and fax referral to SNF rehab. Will need PT evaluation. ED provider updated. Pt will transfer to [facility].  Sister is the contact for pt. States he has been at [facility] in the past.     Expected Discharge Plan: [place] Barriers to Discharge: Continued Medical Work up   Patient Goals and CMS Choice Patient states their goals for this hospitalization and ongoing recovery are:: prefers for pt to go to rehab CMS Medicare.gov Compare Post Acute Care list provided to:: Patient Represenative (must comment) (Jesse Nunez-sister) Choice offered to / list presented to : Sibling  Expected Discharge Plan and Services Expected Discharge Plan: [place] In-house Referral: Clinical Social Work Discharge Planning Services: CM Consult Post Acute Care Choice: [place] Living arrangements for the past 2 months: Apartment                                      Prior Living Arrangements/Services Living arrangements for the past 2 months: Apartment Lives with:: Self Patient language and need for interpreter reviewed:: Yes Do you feel safe going back to the place where you live?: No   lives alone and needs assistance  Need for Family Participation in Patient Care: Yes (Comment) Care giver support system in place?: Yes (comment) Current home services: DME (rolling walker) Criminal Activity/Legal Involvement Pertinent to Current Situation/Hospitalization: No - Comment as needed  Activities of Daily Living      Permission  Sought/Granted Permission sought to share information with : Case Manager,PCP,Family Supports Permission granted to share information with : Yes, Verbal Permission Granted  Share Information with NAME: Jesse Nunez  Permission granted to share info w AGENCY: SNF rehab  Permission granted to share info w Relationship: sister  Permission granted to share info w Contact Information: [phone]  Emotional Assessment           Psych Involvement: No (comment)  Admission diagnosis:  unable to ambulate Patient Active Problem List   Diagnosis Date Noted  . Subarachnoid hemorrhage ([place]) 07/03/2013  . Depression 07/03/2013  . COPD exacerbation ([place]) 07/03/2013  . Syncope 07/02/2013   PCP:  System, Provider Not In Pharmacy:   [id number] - [place], [place] [address] [place] [address] Phone: [phone] Fax: [phone]     Social Determinants of Health (SDOH) Interventions    Readmission Risk Interventions No flowsheet data found.

## 2020-04-11 NOTE — ED Provider Notes (Signed)
Patient is a 80 year old male who is a transfer from [facility].  He has had some progressive confusion and weakness over the last week.  He was seen in emergency department yesterday with an unremarkable work-up.  This was in [place].  His sister-in-law brought him back to [place] with the hopes that she could place him in an assisted living or skilled nursing facility.  However he has fallen 5 times since then and she has not been able to get him in and out of the car and has had to call the fire department out several times to help him get off the floor.  He was brought to the emergency department for that reason and with hopes that he could be admitted to a skilled nursing facility.  He also was transferred to this facility to have an MRI to assure that he has not had any stroke.  The MRI was nonconcerning.  His labs are nonconcerning.  He is afebrile.  The only other concern is that he has been on a full dose of Valtrex that was not dose for his decreased renal function.  I discussed this with the pharmacist and this potentially could be some indication of Valtrex toxicity.  I feel that it is reasonable to admit him overnight for observation and see if he clears at all with holding the Valtrex.  I spoke with Dr. Josephine Cables he will admit the patient for further treatment.   Malvin Johns, MD 04/11/20 1950

## 2020-04-11 NOTE — ED Notes (Signed)
Patient transported to CT 

## 2020-04-11 NOTE — ED Notes (Signed)
ED Provider at bedside. 

## 2020-04-11 NOTE — ED Triage Notes (Addendum)
Per sister-in-law, patient was just discharged from [facility] yesterday and she noticed that patient is not able to stand up. Pt is supposed to go to an assisted living. Sister-in-law called EMS and EMS informed pt's sister in law to bring patient here because EMS is so backed-up. Patient has shingles started this past week.  Scar to his abdomen is still visible.

## 2020-04-11 NOTE — ED Notes (Signed)
Jesse Nunez- Sister-n-[phone]. Please Call when Pt get tx to [facility].

## 2020-04-12 DIAGNOSIS — B029 Zoster without complications: Secondary | ICD-10-CM

## 2020-04-12 DIAGNOSIS — W19XXXA Unspecified fall, initial encounter: Secondary | ICD-10-CM | POA: Diagnosis not present

## 2020-04-12 DIAGNOSIS — R9431 Abnormal electrocardiogram [ECG] [EKG]: Secondary | ICD-10-CM

## 2020-04-12 DIAGNOSIS — E860 Dehydration: Secondary | ICD-10-CM

## 2020-04-12 DIAGNOSIS — R531 Weakness: Secondary | ICD-10-CM

## 2020-04-12 DIAGNOSIS — Y92009 Unspecified place in unspecified non-institutional (private) residence as the place of occurrence of the external cause: Secondary | ICD-10-CM

## 2020-04-12 LAB — COMPREHENSIVE METABOLIC PANEL
ALT: 31 U/L (ref 0–44)
AST: 33 U/L (ref 15–41)
Albumin: 3.1 g/dL — ABNORMAL LOW (ref 3.5–5.0)
Alkaline Phosphatase: 64 U/L (ref 38–126)
Anion gap: 9 (ref 5–15)
BUN: 17 mg/dL (ref 8–23)
CO2: 20 mmol/L — ABNORMAL LOW (ref 22–32)
Calcium: 9 mg/dL (ref 8.9–10.3)
Chloride: 108 mmol/L (ref 98–111)
Creatinine, Ser: 1.46 mg/dL — ABNORMAL HIGH (ref 0.61–1.24)
GFR, Estimated: 49 mL/min — ABNORMAL LOW (ref >60)
Glucose, Bld: 128 mg/dL — ABNORMAL HIGH (ref 70–99)
Potassium: 3.6 mmol/L (ref 3.5–5.1)
Sodium: 137 mmol/L (ref 135–145)
Total Bilirubin: 1.4 mg/dL — ABNORMAL HIGH (ref 0.3–1.2)
Total Protein: 7.1 g/dL (ref 6.5–8.1)

## 2020-04-12 LAB — PROTIME-INR
INR: 1.1 (ref 0.8–1.2)
Prothrombin Time: 13.4 seconds (ref 11.4–15.2)

## 2020-04-12 LAB — PHOSPHORUS: Phosphorus: 3.3 mg/dL (ref 2.5–4.6)

## 2020-04-12 LAB — CBC
HCT: 34.9 % — ABNORMAL LOW (ref 39.0–52.0)
Hemoglobin: 12 g/dL — ABNORMAL LOW (ref 13.0–17.0)
MCH: 29.8 pg (ref 26.0–34.0)
MCHC: 34.4 g/dL (ref 30.0–36.0)
MCV: 86.6 fL (ref 80.0–100.0)
Platelets: 198 10*3/uL (ref 150–400)
RBC: 4.03 MIL/uL — ABNORMAL LOW (ref 4.22–5.81)
RDW: 13.1 % (ref 11.5–15.5)
WBC: 7 10*3/uL (ref 4.0–10.5)
nRBC: 0 % (ref 0.0–0.2)

## 2020-04-12 LAB — MAGNESIUM: Magnesium: 1.9 mg/dL (ref 1.7–2.4)

## 2020-04-12 LAB — APTT: aPTT: 36 seconds (ref 24–36)

## 2020-04-12 MED ORDER — LISINOPRIL 10 MG PO TABS
10.0000 mg | ORAL_TABLET | Freq: Every day | ORAL | Status: DC
  Administered 2020-04-12 – 2020-04-13 (×2): 10 mg via ORAL
  Filled 2020-04-12 (×2): qty 1

## 2020-04-12 MED ORDER — ZOLPIDEM TARTRATE 5 MG PO TABS
5.0000 mg | ORAL_TABLET | Freq: Every day | ORAL | Status: DC
  Administered 2020-04-12 – 2020-04-13 (×2): 5 mg via ORAL
  Filled 2020-04-12 (×2): qty 1

## 2020-04-12 MED ORDER — ATORVASTATIN CALCIUM 80 MG PO TABS
80.0000 mg | ORAL_TABLET | Freq: Every day | ORAL | Status: DC
  Administered 2020-04-12 – 2020-04-13 (×2): 80 mg via ORAL
  Filled 2020-04-12 (×2): qty 1

## 2020-04-12 MED ORDER — ASPIRIN EC 81 MG PO TBEC
81.0000 mg | DELAYED_RELEASE_TABLET | Freq: Every day | ORAL | Status: DC
  Administered 2020-04-12 – 2020-04-14 (×3): 81 mg via ORAL
  Filled 2020-04-12 (×3): qty 1

## 2020-04-12 NOTE — Evaluation (Signed)
Physical Therapy Evaluation Patient Details Name: Jesse Nunez MRN: [id number] DOB: 1940-10-25 Today's Date: 04/12/2020   History of Present Illness  80 y.o. male with medical history significant for COPD, T2DM, hypertension prior strokes who presents to [facility] ED accompanied by sister-in-law due to generalized weakness and difficulty ambulating. Lives alone in [place] with aide that noted decline. Went home from [facility] ED with sister-in-law with plan to stay until admitted to ALF. Had 5 falls overnight and EMS called to help him up and brought to ED. 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 Recently put on full strength Valtrex due to shingles; ED physican noted too high a dose for his renal function and ?'s toxicity.  Clinical Impression   Pt admitted secondary to problem above with deficits below. PTA patient was living alone, however was becoming weaker and looking into moving to ALF in this area (close to family).  Pt currently requires min-guard assist for all mobility due to general weakness and decr stamina. Anticipate can benefit from HHPT at ALF to maximize his independence in this new setting.  Will continue to follow acutely to maximize functional mobility independence and safety.       Follow Up Recommendations Home health PT (at ALF)    Equipment Recommendations  None recommended by PT    Recommendations for Other Services       Precautions / Restrictions Precautions Precautions: Fall Precaution Comments: H/o several falls in the 24 hours PTA      Mobility  Bed Mobility Overal bed mobility: Needs Assistance Bed Mobility: Supine to Sit     Supine to sit: Min guard     General bed mobility comments: increased time and energy as well as use of bedrail    Transfers Overall transfer level: Needs assistance Equipment used: Rolling walker (2 wheeled) Transfers: Sit to/from [employer] Sit to Stand: Min guard;From elevated surface Stand pivot transfers: Min guard       General transfer comment: close min guard assist from elevated bed  Ambulation/Gait Ambulation/Gait assistance: Min guard Gait Distance (Feet): 25 Feet Assistive device: Rolling walker (2 wheeled) Gait Pattern/deviations: Step-through pattern;Decreased stride length;Trunk flexed   Gait velocity interpretation: 1.31 - 2.62 ft/sec, indicative of limited community ambulator General Gait Details: RW slightly short for pt with pt forward flexed (RW adjusted at end of session);  Stairs            Wheelchair Mobility    Modified Rankin (Stroke Patients Only)       Balance Overall balance assessment: Needs assistance Sitting-balance support: Feet supported Sitting balance-Leahy Scale: Good     Standing balance support: Bilateral upper extremity supported Standing balance-Leahy Scale: Poor Standing balance comment: requires UE support                             Pertinent Vitals/Pain Pain Assessment: 0-10 Pain Score: 2  Pain Location: left abdomen (shingles), and generalized soreness due to recent falls/slides to the floor Pain Descriptors / Indicators: Burning;Discomfort;Aching Pain Intervention(s): Monitored during session    Home Living Family/patient expects to be discharged to:: Assisted living Living Arrangements: Alone             Home Equipment: Shower seat;Cane - single point;Walker - 4 wheels Additional Comments: Pt was living alone in a condo/apartment PTA, but has arranged to transition to [facility] at discharge.  He reports  he was planning to transition there prior to his illness    Prior Function Level of Independence: Independent         Comments: prior to recent illness did not have an aide and was independent (driving, groceries); using cane vs recent rollator     Hand Dominance   Dominant Hand: Right     Extremity/Trunk Assessment   Upper Extremity Assessment Upper Extremity Assessment: Defer to OT evaluation    Lower Extremity Assessment Lower Extremity Assessment: Generalized weakness    Cervical / Trunk Assessment Cervical / Trunk Assessment: Kyphotic  Communication   Communication: No difficulties  Cognition Arousal/Alertness: Awake/alert Behavior During Therapy: WFL for tasks assessed/performed Overall Cognitive Status: Within Functional Limits for tasks assessed                                 General Comments: Cognition appears [facility] for basic ADLs and basic tasks.  He seems to have a good awareness of his deficits, good safety awareness, and good deductive reasoning, and good recall of recent events.  Higher level cognition not assessed      General Comments General comments (skin integrity, edema, etc.): Pt denies that he has fallen stating "I slid to the floor everytime I tried to stand"    Exercises     Assessment/Plan    PT Assessment Patient needs continued PT services  PT Problem List Decreased strength;Decreased activity tolerance;Decreased balance;Decreased mobility;Decreased knowledge of use of DME       PT Treatment Interventions DME instruction;Gait training;Functional mobility training;Therapeutic activities;Therapeutic exercise;Balance training;Patient/family education    PT Goals (Current goals can be found in the Care Plan section)  Acute Rehab PT Goals Patient Stated Goal: To feel better PT Goal Formulation: With patient Time For Goal Achievement: 04/26/20 Potential to Achieve Goals: Good    Frequency Min 3X/week   Barriers to discharge Decreased caregiver support patient had already been working on transitioning to ALF    Co-evaluation   Reason for Co-Treatment: For Doctor, hospital;To address functional/ADL transfers   OT goals addressed during session: ADL's and self-care       AM-PAC PT "6 Clicks" Mobility   Outcome Measure Help needed turning from your back to your side while in a flat bed without using bedrails?: None Help needed moving from lying on your back to sitting on the side of a flat bed without using bedrails?: A Little Help needed moving to and from a bed to a chair (including a wheelchair)?: A Little Help needed standing up from a chair using your arms (e.g., wheelchair or bedside chair)?: A Little Help needed to walk in hospital room?: A Little Help needed climbing 3-5 steps with a railing? : Total 6 Click Score: 17    End of Session Equipment Utilized During Treatment: Gait belt Activity Tolerance: Patient limited by fatigue Patient left: in chair;with call bell/phone within reach;with chair alarm set Nurse Communication: Mobility status;Other (comment) (on chair alarm) PT Visit Diagnosis: Unsteadiness on feet (R26.81);Muscle weakness (generalized) (M62.81);History of falling (Z91.81)    Time: 1104-1130 PT Time Calculation (min) (ACUTE ONLY): 26 min   Charges:   PT Evaluation $PT Eval Moderate Complexity: 1 Mod           Arby Barrette, PT Pager [phone]   Rexanne Mano 04/12/2020, 1:04 PM

## 2020-04-12 NOTE — Care Management Obs Status (Signed)
[facility] NOTIFICATION   Patient Details  Name: Jesse Nunez MRN: [id number] Date of Birth: 10-Oct-1940   Medicare Observation Status Notification Given:  No    Carles Collet, RN 04/12/2020, 5:02 PM

## 2020-04-12 NOTE — Progress Notes (Signed)
TR[facility] night shift.  The nursing staff reported that the patient would like to resume his regular nightly Ambien tonight so it can help him sleep.  He is on 10 mg p.o. at bedtime at home.  I will order 5 mg because of high risk low acquired long QT or torsades on patients over 65.  Tennis Must, MD.

## 2020-04-12 NOTE — Progress Notes (Signed)
Patient arrived to unit transferred from stretcher to bed successfully. No complaints from patient at this time. Will carry out orders and prescribed and notify physician for any new changes.

## 2020-04-12 NOTE — Progress Notes (Signed)
PROGRESS NOTE  Jeramy Dimmick GQQ:[id number] DOB: 10-25-1940 DOA: 04/11/2020 PCP: System, Provider Not In  HPI/Recap of past 24 hours: HPI from Dr Nelson Chimes is a 80 y.o. male with medical history significant for COPD, T2DM, hypertension prior strokes who presents to [facility] ED accompanied by sister-in-law due to generalized weakness and difficulty ambulating.  Patient was somnolent, though easily arousable.  History was obtained from sister-in-law at bedside and ED physician. Per report, patient lives alone in [place], he has a home health nurse aide who reported that he has had a recent decrease in appetite and decreased ambulation (uses a Rollator to ambulate on his own).  He was recently seen on 3/24 for shingles on his abdomen and was started on valacyclovir and pain medication, with increasing confusion. Pt presented to [facility] ED on 04/10/20, lab work done (CBC, CMP, thyroid, troponin, EKG, chest x-ray and urinalysis) showed no acute findings.  Patient was discharged home with sister-in-law whom he will stay with pending being able to go to an assisted living facility.  Unfortunately, sister-in-law noted that patient was unable to get out of the car and had difficulty in ambulation due to extreme weakness and multiple falls. EMS was activated and patient was taken to the ED for further evaluation and management.  In the ED, patient was hemodynamically stable, fairly stable, CT head/MRI brain showed no acute intracranial abnormality.  Admitted for further management.    Today, patient still complains of some tenderness on his abdomen due to shingles, reports generalized weakness, poor appetite.  Patient denies any chest pain, shortness of breath, nausea/vomiting, fever/chills.     Assessment/Plan: Principal Problem:   Generalized weakness Active Problems:   Failure to thrive in adult   Hypokalemia   Dehydration   Fall at home, initial encounter   Shingles   Prolonged  QT interval   Generalized weakness with recurrent falls Dehydration Possibly medication related, was taking valacyclovir was not renally adjusted Vs poor appetite, decreased ambulation Work-up so far including CT head/brain MRI negative for any acute changes S/p IV fluids PT/OT Fall precautions  Shingles on abdomen Weakness/confusion started after patient received valacyclovir Continue to hold valacyclovir  Hypertension Uncontrolled Continue lisinopril  Prolonged QTC Avoid QTC prolonging medications EKG as needed  Hyperlipidemia Continue Lipitor  COPD Continue albuterol      Estimated body mass index is 25.41 kg/m as calculated from the following:   Height as of this encounter: 5\' 11"  (1.803 m).   Weight as of this encounter: 82.6 kg.     Code Status: DNR  Family Communication: None at bedside  Disposition Plan: Status is: Observation  The patient remains OBS appropriate and will d/c before 2 midnights.  Dispo: The patient is from: Home              Anticipated d/c is to: ALF              Patient currently is medically stable to d/c.   Difficult to place patient No    Consultants:  None  Procedures:  None  Antimicrobials:  None  DVT prophylaxis: Lovenox   Objective: Vitals:   04/12/20 0300 04/12/20 0315 04/12/20 0337 04/12/20 0500  BP: (!) 184/87 (!) 167/89 (!) 182/100 (!) 168/65  Pulse: 72 72 75 78  Resp: 12 16 14 16   Temp:   98.1 F (36.7 C) 99.6 F (37.6 C)  TempSrc:   Oral Oral  SpO2: 97% 97% 96% 98%  Weight:  Height:        Intake/Output Summary (Last 24 hours) at 04/12/2020 1404 Last data filed at 04/12/2020 0954 Gross per 24 hour  Intake 180 ml  Output 725 ml  Net -545 ml   Filed Weights   04/11/20 1202  Weight: 82.6 kg    Exam:  General: NAD   Cardiovascular: S1, S2 present  Respiratory: CTAB  Abdomen: Soft, nontender, nondistended, bowel sounds present  Musculoskeletal: No bilateral pedal edema  noted  Skin: Shingles rash noted on abdomen, appears to be healing  Psychiatry: Normal mood    Data Reviewed: CBC: Recent Labs  Lab 04/11/20 1217 04/12/20 0531  WBC 8.4 7.0  NEUTROABS 5.7  --   HGB 12.6* 12.0*  HCT 37.2* 34.9*  MCV 87.7 86.6  PLT 196 270   Basic Metabolic Panel: Recent Labs  Lab 04/11/20 1217 04/12/20 0531  NA 139 137  K 3.4* 3.6  CL 106 108  CO2 21* 20*  GLUCOSE 109* 128*  BUN 19 17  CREATININE 1.46* 1.46*  CALCIUM 9.5 9.0  MG  --  1.9  PHOS  --  3.3   GFR: Estimated Creatinine Clearance: 43.7 mL/min (A) (by C-G formula based on SCr of 1.46 mg/dL (H)). Liver Function Tests: Recent Labs  Lab 04/12/20 0531  AST 33  ALT 31  ALKPHOS 64  BILITOT 1.4*  PROT 7.1  ALBUMIN 3.1*   No results for input(s): LIPASE, AMYLASE in the last 168 hours. No results for input(s): AMMONIA in the last 168 hours. Coagulation Profile: Recent Labs  Lab 04/12/20 0531  INR 1.1   Cardiac Enzymes: No results for input(s): CKTOTAL, CKMB, CKMBINDEX, TROPONINI in the last 168 hours. BNP (last 3 results) No results for input(s): PROBNP in the last 8760 hours. HbA1C: No results for input(s): HGBA1C in the last 72 hours. CBG: Recent Labs  Lab 04/11/20 1201  GLUCAP 111*   Lipid Profile: No results for input(s): CHOL, HDL, LDLCALC, TRIG, CHOLHDL, LDLDIRECT in the last 72 hours. Thyroid Function Tests: No results for input(s): TSH, T4TOTAL, FREET4, T3FREE, THYROIDAB in the last 72 hours. Anemia Panel: No results for input(s): VITAMINB12, FOLATE, FERRITIN, TIBC, IRON, RETICCTPCT in the last 72 hours. Urine analysis:    Component Value Date/Time   COLORURINE AMBER (A) 07/02/2013 1829   APPEARANCEUR CLEAR 07/02/2013 1829   LABSPEC 1.030 07/02/2013 1829   PHURINE 5.0 07/02/2013 1829   GLUCOSEU NEGATIVE 07/02/2013 1829   HGBUR NEGATIVE 07/02/2013 1829   BILIRUBINUR SMALL (A) 07/02/2013 1829   KETONESUR 15 (A) 07/02/2013 1829   PROTEINUR NEGATIVE 07/02/2013  1829   UROBILINOGEN 0.2 07/02/2013 1829   NITRITE NEGATIVE 07/02/2013 1829   LEUKOCYTESUR NEGATIVE 07/02/2013 1829   Sepsis Labs: @LABRCNTIP (procalcitonin:4,lacticidven:4)  ) Recent Results (from the past 240 hour(s))  SARS CORONAVIRUS 2 (TAT 6-24 HRS) Nasopharyngeal Nasopharyngeal Swab     Status: None   Collection Time: 04/11/20  1:51 PM   Specimen: Nasopharyngeal Swab  Result Value Ref Range Status   SARS Coronavirus 2 NEGATIVE NEGATIVE Final    Comment: (NOTE) SARS-CoV-2 target nucleic acids are NOT DETECTED.  The SARS-CoV-2 RNA is generally detectable in upper and lower respiratory specimens during the acute phase of infection. Negative results do not preclude SARS-CoV-2 infection, do not rule out co-infections with other pathogens, and should not be used as the sole basis for treatment or other patient management decisions. Negative results must be combined with clinical observations, patient history, and epidemiological information. The expected result is Negative.  Fact Sheet for Patients: SugarRoll.be  Fact Sheet for Healthcare Providers: https://www.woods-mathews.com/  This test is not yet approved or cleared by the [place] FDA and  has been authorized for detection and/or diagnosis of SARS-CoV-2 by FDA under an Emergency Use Authorization (EUA). This EUA will remain  in effect (meaning this test can be used) for the duration of the COVID-19 declaration under Se ction 564(b)(1) of the Act, 21 U.S.C. section 360bbb-3(b)(1), unless the authorization is terminated or revoked sooner.  Performed at [facility], [place] [address] [place], [place] [address]       Studies: MR BRAIN WO CONTRAST  Result Date: 04/11/2020 CLINICAL DATA:  Neuro deficit.  Acute stroke suspected. EXAM: MRI HEAD WITHOUT CONTRAST TECHNIQUE: Multiplanar, multiecho pulse sequences of the brain and surrounding structures were obtained without  intravenous contrast. COMPARISON:  CT head without contrast 04/11/2020. MR head without contrast 07/02/2013. FINDINGS: Brain: Diffusion-weighted images demonstrate no acute or subacute infarction. Acute hemorrhage or mass lesion is present. Periventricular and subcortical T2 hyperintensities have increased since prior exam. Lacunar infarcts of the corona radiata bilaterally are new since the prior study but not acute. Cortical infarct of the left occipital lobe is new the prior exam but not acute. Progressive generalized atrophy is present. Bilateral remote thalamic infarcts are new since prior study, but not acute. White matter generation extends into the brainstem on the right. White matter changes are again noted in the pons. Increased number of remote lacunar infarcts are present within the cerebellum bilaterally. Vascular: Flow is present in the major intracranial arteries. Skull and upper cervical spine: The craniocervical junction is normal. Upper cervical spine is within normal limits. Marrow signal is unremarkable. Sinuses/Orbits: The paranasal sinuses and mastoid air cells are clear. Scratched at a polyp or mucous retention cyst is present in the left maxillary sinus. Mild mucosal thickening is present the anterior ethmoid air cells and frontal sinuses. No fluid levels are present. The globes and orbits are within normal limits. Other: IMPRESSION: 1. No acute intracranial abnormality. 2. Progressive atrophy and white matter disease. This likely reflects the sequela of chronic microvascular ischemia. 3. 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 Electronically Signed   By: San Morelle M.D.   On: 04/11/2020 18:26    Scheduled Meds: . aspirin EC  81 mg Oral Daily  . atorvastatin  80 mg Oral QHS  . enoxaparin (LOVENOX) injection  40 mg Subcutaneous Q24H  . feeding supplement  237 mL Oral BID BM  .  lisinopril  10 mg Oral Daily    Continuous Infusions:   LOS: 0 days     Alma Friendly, MD [facility]  If 7PM-7AM, please contact night-coverage [email or web address] 04/12/2020, 2:04 PM

## 2020-04-12 NOTE — Evaluation (Signed)
Occupational Therapy Evaluation Patient Details Name: Jesse Nunez MRN: [id number] DOB: [birth year] Today's Date: 04/12/2020    History of Present Illness 80 y.o. male with medical history significant for COPD, T2DM, hypertension prior strokes who presents to [facility] ED accompanied by sister-in-law due to generalized weakness and difficulty ambulating. Lives alone in [place] with aide that noted decline. Went home from [facility] ED with sister-in-law with plan to stay until admitted to ALF. Had 5 falls overnight and EMS called to help him up and brought to ED. 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 Recently put on full strength Valtrex due to shingles; ED physican noted too high a dose for his renal function and ?'s toxicity.   Clinical Impression   Pt admitted with above. He demonstrates the below listed deficits and will benefit from continued OT to maximize safety and independence with BADLs.  Pt presents to OT with generalized weakness, decreased activity tolerance, impaired balance, and generalized pain.  He currently requires set up assist - min A for ADLs and min guard to min A for functional mobility.  PTA, he was living alone and was independent with with ADLs and functional mobility with use of SPC.  He reports he had started using a rollator in the week, or so, PTA.   He plans to transition to ALF at discharge, and reports his family live in [place], and he had been working on making the transition to ALF prior to the onset of this illness.  Recommend HHOT at ALF upon discharge.  Will follow acutely.       Follow Up Recommendations  Home health OT;Supervision - Intermittent    Equipment Recommendations  None recommended by OT    Recommendations for Other Services       Precautions / Restrictions Precautions Precautions: Fall Precaution Comments: H/o several falls in the 24 hours PTA      Mobility Bed  Mobility Overal bed mobility: Needs Assistance Bed Mobility: Supine to Sit     Supine to sit: Min guard     General bed mobility comments: increased time and energy as well as use of bedrail    Transfers Overall transfer level: Needs assistance Equipment used: Rolling walker (2 wheeled) Transfers: Sit to/from [facility] Sit to Stand: Min guard;From elevated surface Stand pivot transfers: Min guard       General transfer comment: close min guard assist from elevated bed    Balance Overall balance assessment: Needs assistance Sitting-balance support: Feet supported Sitting balance-Leahy Scale: Good     Standing balance support: Bilateral upper extremity supported Standing balance-Leahy Scale: Poor Standing balance comment: requires UE support                           ADL either performed or assessed with clinical judgement   ADL Overall ADL's : Needs assistance/impaired Eating/Feeding: Independent   Grooming: Wash/dry hands;Wash/dry face;Oral care;Brushing hair;Min guard;Standing   Upper Body Bathing: Set up;Supervision/ safety;Sitting   Lower Body Bathing: Minimal assistance;Sit to/from stand   Upper Body Dressing : Set up;Supervision/safety;Sitting   Lower Body Dressing: Min guard;Sit to/from stand   Toilet Transfer: Ambulation;BSC;RW;Minimal assistance Toilet Transfer Details (indicate cue type and reason): slight assist to move into standing and to control descent to chair Toileting- Clothing Manipulation and Hygiene: Minimal assistance;Sit to/from stand       Functional mobility during ADLs: Minimal assistance;Rolling walker General ADL Comments: DOE  2/4 with activity     Vision Patient Visual Report: No change from baseline       Perception     Praxis      Pertinent Vitals/Pain Pain Assessment: 0-10 Pain Score: 2  Pain Location: left abdomen (shingles), and generalized soreness due to recent falls/slides to the  floor Pain Descriptors / Indicators: Burning;Discomfort;Aching Pain Intervention(s): Monitored during session;Limited activity within patient's tolerance     Hand Dominance Right   Extremity/Trunk Assessment Upper Extremity Assessment Upper Extremity Assessment: Generalized weakness   Lower Extremity Assessment Lower Extremity Assessment: Defer to PT evaluation   Cervical / Trunk Assessment Cervical / Trunk Assessment: Kyphotic   Communication Communication Communication: No difficulties   Cognition Arousal/Alertness: Awake/alert Behavior During Therapy: WFL for tasks assessed/performed Overall Cognitive Status: Within Functional Limits for tasks assessed                                 General Comments: Cognition appears [facility] for basic ADLs and basic tasks.  He seems to have a good awareness of his deficits, good safety awareness, and good deductive reasoning, and good recall of recent events.  Higher level cognition not assessed   General Comments  Pt denies that he has fallen stating "I slid to the floor everytime I tried to stand"    Exercises     Shoulder Instructions      Home Living Family/patient expects to be discharged to:: Assisted living Living Arrangements: Alone                           Home Equipment: Shower seat;Cane - single point;Walker - 4 wheels   Additional Comments: Pt was living alone in a condo/apartment PTA, but has arranged to transition to [place] at discharge.  He reports he was planning to transition there prior to his illness      Prior Functioning/Environment Level of Independence: Independent        Comments: prior to recent illness did not have an aide and was independent (driving, groceries); using cane vs recent rollator        OT Problem List: Decreased strength;Decreased activity tolerance;Impaired balance (sitting and/or standing);Decreased knowledge of use of DME or AE;Pain      OT  Treatment/Interventions: Self-care/ADL training;DME and/or AE instruction;Therapeutic activities;Balance training;Therapeutic exercise    OT Goals(Current goals can be found in the care plan section) Acute Rehab OT Goals Patient Stated Goal: To feel better OT Goal Formulation: With patient Time For Goal Achievement: 04/26/20 Potential to Achieve Goals: Good ADL Goals Pt Will Perform Grooming: with supervision;standing Pt Will Perform Upper Body Bathing: with supervision;standing;with set-up;sitting Pt Will Perform Lower Body Bathing: with supervision;sit to/from stand Pt Will Perform Upper Body Dressing: with supervision;sitting Pt Will Perform Lower Body Dressing: with supervision;sit to/from stand Pt Will Transfer to Toilet: with supervision;ambulating;regular height toilet;grab bars Pt Will Perform Toileting - Clothing Manipulation and hygiene: with supervision;sit to/from stand Pt Will Perform Tub/Shower Transfer: Shower transfer;ambulating;shower seat  OT Frequency: Min 2X/week   Barriers to D/C: Decreased caregiver support          Co-evaluation PT/OT/SLP Co-Evaluation/Treatment: Yes Reason for Co-Treatment: For patient/therapist safety;To address functional/ADL transfers   OT goals addressed during session: ADL's and self-care      AM-PAC OT "6 Clicks" Daily Activity     Outcome Measure Help from another person eating meals?: None Help  from another person taking care of personal grooming?: A Little Help from another person toileting, which includes using toliet, bedpan, or urinal?: A Little Help from another person bathing (including washing, rinsing, drying)?: A Little Help from another person to put on and taking off regular upper body clothing?: A Little Help from another person to put on and taking off regular lower body clothing?: A Little 6 Click Score: 19   End of Session Equipment Utilized During Treatment: Gait belt;Rolling walker Nurse Communication: Mobility  status  Activity Tolerance: Patient tolerated treatment well Patient left: in chair;with call bell/phone within reach;with chair alarm set  OT Visit Diagnosis: Unsteadiness on feet (R26.81);Pain Pain - part of body:  (trunk and generalized)                Time: 1101-1130 OT Time Calculation (min): 29 min Charges:     Nilsa Nutting OTR/L Acute Rehabilitation Services Pager [phone] Office [phone]    Lucille Passy M 04/12/2020, 11:51 AM

## 2020-04-13 DIAGNOSIS — R531 Weakness: Secondary | ICD-10-CM | POA: Diagnosis not present

## 2020-04-13 DIAGNOSIS — R9431 Abnormal electrocardiogram [ECG] [EKG]: Secondary | ICD-10-CM | POA: Diagnosis not present

## 2020-04-13 DIAGNOSIS — W19XXXA Unspecified fall, initial encounter: Secondary | ICD-10-CM | POA: Diagnosis not present

## 2020-04-13 DIAGNOSIS — E860 Dehydration: Secondary | ICD-10-CM | POA: Diagnosis not present

## 2020-04-13 LAB — CBC WITH DIFFERENTIAL/PLATELET
Abs Immature Granulocytes: 0.04 10*3/uL (ref 0.00–0.07)
Basophils Absolute: 0 10*3/uL (ref 0.0–0.1)
Basophils Relative: 0 %
Eosinophils Absolute: 0.2 10*3/uL (ref 0.0–0.5)
Eosinophils Relative: 3 %
HCT: 36.4 % — ABNORMAL LOW (ref 39.0–52.0)
Hemoglobin: 12.2 g/dL — ABNORMAL LOW (ref 13.0–17.0)
Immature Granulocytes: 1 %
Lymphocytes Relative: 28 %
Lymphs Abs: 2.1 10*3/uL (ref 0.7–4.0)
MCH: 29.8 pg (ref 26.0–34.0)
MCHC: 33.5 g/dL (ref 30.0–36.0)
MCV: 88.8 fL (ref 80.0–100.0)
Monocytes Absolute: 0.9 10*3/uL (ref 0.1–1.0)
Monocytes Relative: 12 %
Neutro Abs: 4 10*3/uL (ref 1.7–7.7)
Neutrophils Relative %: 56 %
Platelets: 226 10*3/uL (ref 150–400)
RBC: 4.1 MIL/uL — ABNORMAL LOW (ref 4.22–5.81)
RDW: 13.2 % (ref 11.5–15.5)
WBC: 7.3 10*3/uL (ref 4.0–10.5)
nRBC: 0 % (ref 0.0–0.2)

## 2020-04-13 LAB — BASIC METABOLIC PANEL
Anion gap: 8 (ref 5–15)
BUN: 16 mg/dL (ref 8–23)
CO2: 24 mmol/L (ref 22–32)
Calcium: 9.3 mg/dL (ref 8.9–10.3)
Chloride: 108 mmol/L (ref 98–111)
Creatinine, Ser: 1.63 mg/dL — ABNORMAL HIGH (ref 0.61–1.24)
GFR, Estimated: 43 mL/min — ABNORMAL LOW (ref >60)
Glucose, Bld: 142 mg/dL — ABNORMAL HIGH (ref 70–99)
Potassium: 4.4 mmol/L (ref 3.5–5.1)
Sodium: 140 mmol/L (ref 135–145)

## 2020-04-13 MED ORDER — ONDANSETRON HCL 4 MG/2ML IJ SOLN
4.0000 mg | Freq: Three times a day (TID) | INTRAMUSCULAR | Status: DC | PRN
Start: 2020-04-13 — End: 2020-04-15
  Administered 2020-04-13 – 2020-04-14 (×2): 4 mg via INTRAVENOUS
  Filled 2020-04-13 (×2): qty 2

## 2020-04-13 MED ORDER — METHOCARBAMOL 500 MG PO TABS
500.0000 mg | ORAL_TABLET | Freq: Four times a day (QID) | ORAL | Status: AC | PRN
  Administered 2020-04-13 (×2): 500 mg via ORAL
  Filled 2020-04-13 (×2): qty 1

## 2020-04-13 MED ORDER — ACETAMINOPHEN 325 MG PO TABS
650.0000 mg | ORAL_TABLET | ORAL | Status: DC | PRN
  Administered 2020-04-13: 650 mg via ORAL
  Filled 2020-04-13: qty 2

## 2020-04-13 NOTE — Progress Notes (Signed)
TRH night shift.  The nursing staff reported that the patient is having back pain and is requesting something to treat it.  Methocarbamol 500 mg p.o. every 6 hours as needed x2 doses ordered.  Tennis Must, MD.

## 2020-04-13 NOTE — Progress Notes (Signed)
PROGRESS NOTE  Jesse Nunez GGY:[id number] DOB: 1940/12/23 DOA: 04/11/2020 PCP: System, Provider Not In  HPI/Recap of past 24 hours: HPI from Dr Nelson Chimes is a 80 y.o. male with medical history significant for COPD, T2DM, hypertension prior strokes who presents to [facility] ED accompanied by sister-in-law due to generalized weakness and difficulty ambulating.  Patient was somnolent, though easily arousable.  History was obtained from sister-in-law at bedside and ED physician. Per report, patient lives alone in [place], he has a home health nurse aide who reported that he has had a recent decrease in appetite and decreased ambulation (uses a Rollator to ambulate on his own).  He was recently seen on 3/24 for shingles on his abdomen and was started on valacyclovir and pain medication, with increasing confusion. Pt presented to [facility] ED on 04/10/20, lab work done (CBC, CMP, thyroid, troponin, EKG, chest x-ray and urinalysis) showed no acute findings.  Patient was discharged home with sister-in-law whom he will stay with pending being able to go to an assisted living facility.  Unfortunately, sister-in-law noted that patient was unable to get out of the car and had difficulty in ambulation due to extreme weakness and multiple falls. EMS was activated and patient was taken to the ED for further evaluation and management.  In the ED, patient was hemodynamically stable, fairly stable, CT head/MRI brain showed no acute intracranial abnormality.  Admitted for further management.     Today, patient denies any new complaints.    Assessment/Plan: Principal Problem:   Generalized weakness Active Problems:   Failure to thrive in adult   Hypokalemia   Dehydration   Fall at home, initial encounter   Shingles   Prolonged QT interval   Generalized weakness with recurrent falls Dehydration Possibly medication related, was taking valacyclovir was not renally adjusted Vs poor appetite,  decreased ambulation Work-up so far including CT head/brain MRI negative for any acute changes S/p IV fluids PT/OT Fall precautions  Shingles on abdomen Weakness/confusion started after patient received valacyclovir Continue to hold valacyclovir  Hypertension Continue lisinopril  Prolonged QTC Avoid QTC prolonging medications EKG as needed  Hyperlipidemia Continue Lipitor  COPD Continue albuterol      Estimated body mass index is 25.41 kg/m as calculated from the following:   Height as of this encounter: 5\' 11"  (1.803 m).   Weight as of this encounter: 82.6 kg.     Code Status: DNR  Family Communication: None at bedside  Disposition Plan: Status is: Observation  The patient will require care spanning > 2 midnights and should be moved to inpatient because: Unsafe d/c plan  Dispo: The patient is from: Home              Anticipated d/c is to: SNF              Patient currently is medically stable to d/c.   Difficult to place patient No    Consultants:  None  Procedures:  None  Antimicrobials:  None  DVT prophylaxis: Lovenox   Objective: Vitals:   04/12/20 2000 04/12/20 2054 04/13/20 0607 04/13/20 1432  BP:  (!) 152/71 (!) 147/88 (!) 141/92  Pulse:  82 75 82  Resp: 18 18 18 17   Temp:  98.2 F (36.8 C) 97.8 F (36.6 C) 98 F (36.7 C)  TempSrc:  Oral Oral   SpO2:  95% 96% 92%  Weight:      Height:        Intake/Output Summary (Last  24 hours) at 04/13/2020 1557 Last data filed at 04/13/2020 1500 Gross per 24 hour  Intake 900 ml  Output 1250 ml  Net -350 ml   Filed Weights   04/11/20 1202  Weight: 82.6 kg    Exam:  General: NAD   Cardiovascular: S1, S2 present  Respiratory: CTAB  Abdomen: Soft, nontender, nondistended, bowel sounds present  Musculoskeletal: No bilateral pedal edema noted  Skin: Shingles rash noted on abdomen, appears to be healing  Psychiatry: Normal mood    Data Reviewed: CBC: Recent Labs  Lab  04/11/20 1217 04/12/20 0531 04/13/20 0051  WBC 8.4 7.0 7.3  NEUTROABS 5.7  --  4.0  HGB 12.6* 12.0* 12.2*  HCT 37.2* 34.9* 36.4*  MCV 87.7 86.6 88.8  PLT 196 198 350   Basic Metabolic Panel: Recent Labs  Lab 04/11/20 1217 04/12/20 0531 04/13/20 0051  NA 139 137 140  K 3.4* 3.6 4.4  CL 106 108 108  CO2 21* 20* 24  GLUCOSE 109* 128* 142*  BUN 19 17 16   CREATININE 1.46* 1.46* 1.63*  CALCIUM 9.5 9.0 9.3  MG  --  1.9  --   PHOS  --  3.3  --    GFR: Estimated Creatinine Clearance: 39.1 mL/min (A) (by C-G formula based on SCr of 1.63 mg/dL (H)). Liver Function Tests: Recent Labs  Lab 04/12/20 0531  AST 33  ALT 31  ALKPHOS 64  BILITOT 1.4*  PROT 7.1  ALBUMIN 3.1*   No results for input(s): LIPASE, AMYLASE in the last 168 hours. No results for input(s): AMMONIA in the last 168 hours. Coagulation Profile: Recent Labs  Lab 04/12/20 0531  INR 1.1   Cardiac Enzymes: No results for input(s): CKTOTAL, CKMB, CKMBINDEX, TROPONINI in the last 168 hours. BNP (last 3 results) No results for input(s): PROBNP in the last 8760 hours. HbA1C: No results for input(s): HGBA1C in the last 72 hours. CBG: Recent Labs  Lab 04/11/20 1201  GLUCAP 111*   Lipid Profile: No results for input(s): CHOL, HDL, LDLCALC, TRIG, CHOLHDL, LDLDIRECT in the last 72 hours. Thyroid Function Tests: No results for input(s): TSH, T4TOTAL, FREET4, T3FREE, THYROIDAB in the last 72 hours. Anemia Panel: No results for input(s): VITAMINB12, FOLATE, FERRITIN, TIBC, IRON, RETICCTPCT in the last 72 hours. Urine analysis:    Component Value Date/Time   COLORURINE AMBER (A) 07/02/2013 1829   APPEARANCEUR CLEAR 07/02/2013 1829   LABSPEC 1.030 07/02/2013 1829   PHURINE 5.0 07/02/2013 1829   GLUCOSEU NEGATIVE 07/02/2013 1829   HGBUR NEGATIVE 07/02/2013 1829   BILIRUBINUR SMALL (A) 07/02/2013 1829   KETONESUR 15 (A) 07/02/2013 1829   PROTEINUR NEGATIVE 07/02/2013 1829   UROBILINOGEN 0.2 07/02/2013 1829    NITRITE NEGATIVE 07/02/2013 1829   LEUKOCYTESUR NEGATIVE 07/02/2013 1829   Sepsis Labs: @LABRCNTIP (procalcitonin:4,lacticidven:4)  ) Recent Results (from the past 240 hour(s))  SARS CORONAVIRUS 2 (TAT 6-24 HRS) Nasopharyngeal Nasopharyngeal Swab     Status: None   Collection Time: 04/11/20  1:51 PM   Specimen: Nasopharyngeal Swab  Result Value Ref Range Status   SARS Coronavirus 2 NEGATIVE NEGATIVE Final    Comment: (NOTE) SARS-CoV-2 target nucleic acids are NOT DETECTED.  The SARS-CoV-2 RNA is generally detectable in upper and lower respiratory specimens during the acute phase of infection. Negative results do not preclude SARS-CoV-2 infection, do not rule out co-infections with other pathogens, and should not be used as the sole basis for treatment or other patient management decisions. Negative results must be combined  with clinical observations, patient history, and epidemiological information. The expected result is Negative.  Fact Sheet for Patients: SugarRoll.be  Fact Sheet for Healthcare Providers: https://www.woods-mathews.com/  This test is not yet approved or cleared by the [place] FDA and  has been authorized for detection and/or diagnosis of SARS-CoV-2 by FDA under an Emergency Use Authorization (EUA). This EUA will remain  in effect (meaning this test can be used) for the duration of the COVID-19 declaration under Se ction 564(b)(1) of the Act, 21 U.S.C. section 360bbb-3(b)(1), unless the authorization is terminated or revoked sooner.  Performed at [facility], [place] [address], [place], [place] [address]       Studies: No results found.  Scheduled Meds: . aspirin EC  81 mg Oral Daily  . atorvastatin  80 mg Oral QHS  . enoxaparin (LOVENOX) injection  40 mg Subcutaneous Q24H  . feeding supplement  237 mL Oral BID BM  . lisinopril  10 mg Oral Daily  . zolpidem  5 mg Oral QHS    Continuous  Infusions:   LOS: 0 days     Alma Friendly, MD [facility]  If 7PM-7AM, please contact night-coverage [email or web address] 04/13/2020, 3:57 PM

## 2020-04-13 NOTE — NC FL2 (Signed)
[place] LEVEL OF CARE SCREENING TOOL     IDENTIFICATION  Patient Name: Jesse Nunez Birthdate: October 14, 1940 Sex: male Admission Date (Current Location): 04/11/2020  [facility] and [place] Number:  Herbalist and Address:  [facility] [place] [address] [place], [place] [address]      Provider Number: [id number]  Attending Physician Name and Address:  Alma Friendly, MD  Relative Name and Phone Number:  Keino Placencia [phone]    Current Level of Care: Hospital Recommended Level of Care: [place] Prior Approval Number:    Date Approved/Denied:   PASRR Number: [id number]  Discharge Plan: SNF    Current Diagnoses: Patient Active Problem List   Diagnosis Date Noted  . Prolonged QT interval 04/12/2020  . Generalized weakness 04/11/2020  . Failure to thrive in adult 04/11/2020  . Hypokalemia 04/11/2020  . Dehydration 04/11/2020  . Fall at home, initial encounter 04/11/2020  . Shingles 04/11/2020  . Subarachnoid hemorrhage ([place]) 07/03/2013  . Depression 07/03/2013  . COPD exacerbation ([place]) 07/03/2013  . Syncope 07/02/2013    Orientation RESPIRATION BLADDER Height & Weight     Self,Place  Normal Continent Weight: 182 lb 3.2 oz (82.6 kg) Height:  5\' 11"  (180.3 cm)  BEHAVIORAL SYMPTOMS/MOOD NEUROLOGICAL BOWEL NUTRITION STATUS      Incontinent Diet (See DC summary)  AMBULATORY STATUS COMMUNICATION OF NEEDS Skin   Limited Assist Verbally Skin abrasions (Rash, abdomen, back and ribs)                       Personal Care Assistance Level of Assistance  Feeding,Bathing,Dressing Bathing Assistance: Limited assistance Feeding assistance: Limited assistance Dressing Assistance: Limited assistance     Functional Limitations Info  Sight,Hearing,Speech Sight Info: Adequate Hearing Info: Adequate Speech Info: Adequate    SPECIAL CARE FACTORS FREQUENCY  PT (By licensed PT),OT (By licensed OT)     PT  Frequency: 5x week OT Frequency: 5x week            Contractures Contractures Info: Not present    Additional Factors Info  Code Status,Allergies Code Status Info: DNR Allergies Info: Glipizide           Current Medications (04/13/2020):  This is the current hospital active medication list Current Facility-Administered Medications  Medication Dose Route Frequency Provider Last Rate Last Admin  . aspirin EC tablet 81 mg  81 mg Oral Daily Adefeso, Oladapo, DO   81 mg at 04/13/20 9381  . atorvastatin (LIPITOR) tablet 80 mg  80 mg Oral QHS Adefeso, Oladapo, DO   80 mg at 04/12/20 2157  . enoxaparin (LOVENOX) injection 40 mg  40 mg Subcutaneous Q24H Adefeso, Oladapo, DO   40 mg at 04/12/20 2157  . feeding supplement (ENSURE ENLIVE / ENSURE PLUS) liquid 237 mL  237 mL Oral BID BM Adefeso, Oladapo, DO   237 mL at 04/12/20 1423  . lisinopril (ZESTRIL) tablet 10 mg  10 mg Oral Daily Adefeso, Oladapo, DO   10 mg at 04/13/20 8299  . methocarbamol (ROBAXIN) tablet 500 mg  500 mg Oral Q6H PRN Reubin Milan, MD   500 mg at 04/13/20 0236  . ondansetron (ZOFRAN) injection 4 mg  4 mg Intravenous Q8H PRN Alma Friendly, MD   4 mg at 04/13/20 1139  . zolpidem (AMBIEN) tablet 5 mg  5 mg Oral QHS Reubin Milan, MD   5 mg at 04/12/20 2158  Discharge Medications: Please see discharge summary for a list of discharge medications.  Relevant Imaging Results:  Relevant Lab Results:   Additional Information SS# [place], [place]

## 2020-04-13 NOTE — Progress Notes (Signed)
   04/13/20 1000  OT Recommendation  Follow Up Recommendations SNF   Per SW, the facility pt was planning to discharge to is NOT an ALF, but is an independent living facility.  Pt will need initial 24 hour assist due to balance deficits and generalized weakness which place him at significant risk for falls.   Discharge recommendation has been changed to SNF to allow pt to maximize his independence with ADLs and functional mobility, in order to transition safely to independent living facility.    Nilsa Nutting., OTR/L Acute Rehabilitation Services Pager [phone] Office [phone]

## 2020-04-13 NOTE — Progress Notes (Signed)
Physical Therapy  Note    04/13/20 1100  Home Living  Family/patient expects to be discharged to: [place] Alone  Additional Comments Pt was living alone in a condo/apartment PTA, but has arranged to transition to [place] facility at discharge.  He reports he was planning to transition there prior to his illness  PT Recommendation  Follow Up Recommendations SNF   Originally understood pt was going to go to an Assisted Living facility upon discharge, however TOC team clarified that it is and [place] facility and pt will not have 24 hour supervision or assist. With this new information, discharge recommendation updated to SNF for additional therapies and then hopefully progress to ALF or ILF.    Arby Barrette, PT Pager [phone]

## 2020-04-13 NOTE — TOC Progression Note (Addendum)
Transition of Care [facility]) - Progression Note    Patient Details  Name: Jesse Nunez MRN: [id number] Date of Birth: 04-06-1940  Transition of [facility] CM/SW [place], [place] Phone Number: 04/13/2020, 12:09 PM  Clinical Narrative:    CSW spoke with sister in law to confirm DC plan. She stated that pt had a place at an ALF, CSW spoke with the facility Vickie noted to see if they contract with [facility] providers and the facility is an independent living, not an ALF. With this information CSW requested PT/ OT change rec to SNF. This was discussed with sister in law, who noted pt has been to [facility], and she would not mind him going back there. CSW will do workup, faxout, insurance auth, and speak with [facility] about placement. SW will continue to follow for DC planning.   [place] requested auth be started. This was done. Will meet with pt and SIL at 230p.  CSW met with pt and SIL, Vickie. They are agreeable to the plan. CSW requested a covid test for pt to DC tomorrow if able. SW will continue to follow.  Expected Discharge Plan: [place] Barriers to Discharge: [place] Work up,SNF Pending bed offer  Expected Discharge Plan and Services Expected Discharge Plan: [place] In-house Referral: Clinical Social Work Discharge Planning Services: CM Consult Post Acute Care Choice: [place] Living arrangements for the past 2 months: Apartment                                       Social Determinants of Health (SDOH) Interventions    Readmission Risk Interventions No flowsheet data found.

## 2020-04-14 DIAGNOSIS — R627 Adult failure to thrive: Secondary | ICD-10-CM | POA: Diagnosis not present

## 2020-04-14 DIAGNOSIS — R9431 Abnormal electrocardiogram [ECG] [EKG]: Secondary | ICD-10-CM | POA: Diagnosis not present

## 2020-04-14 DIAGNOSIS — B029 Zoster without complications: Secondary | ICD-10-CM | POA: Diagnosis not present

## 2020-04-14 DIAGNOSIS — R531 Weakness: Secondary | ICD-10-CM | POA: Diagnosis not present

## 2020-04-14 LAB — RESP PANEL BY RT-PCR (FLU A&B, COVID) ARPGX2
Influenza A by PCR: NEGATIVE
Influenza B by PCR: NEGATIVE
SARS Coronavirus 2 by RT PCR: NEGATIVE

## 2020-04-14 MED ORDER — LISINOPRIL 20 MG PO TABS
20.0000 mg | ORAL_TABLET | Freq: Every day | ORAL | Status: DC
  Administered 2020-04-14: 20 mg via ORAL
  Filled 2020-04-14: qty 1

## 2020-04-14 MED ORDER — ZOLPIDEM TARTRATE 5 MG PO TABS: 5.0000 mg | ORAL_TABLET | Freq: Every day | ORAL | Status: DC

## 2020-04-14 MED ORDER — ENSURE ENLIVE PO LIQD: 237.0000 mL | Freq: Two times a day (BID) | ORAL | 12 refills | Status: DC

## 2020-04-14 NOTE — Plan of Care (Signed)

## 2020-04-14 NOTE — Plan of Care (Signed)

## 2020-04-14 NOTE — Progress Notes (Signed)
Report called to Reuben Likes, Therapist, sports at [employer] [phone].

## 2020-04-14 NOTE — TOC Transition Note (Signed)
Transition of Care [facility]) - CM/SW Discharge Note   Patient Details  Name: Jesse Nunez MRN: [id number] Date of Birth: Nov 11, 1940  Transition of Care [facility]) CM/SW Contact:  Angelita Ingles, RN Phone Number: [phone]  04/14/2020, 3:35 PM   Clinical Narrative:    Patient to discharge to [facility].Discharge summary has been faxed. Family [place] has been notified. Transportation has been set up via [facility]. No other needs noted at this time. TOC will sign off.    Final next level of care: Skilled Nursing Facility Barriers to Discharge: No Barriers Identified   Patient Goals and CMS Choice Patient states their goals for this hospitalization and ongoing recovery are:: Pt unable to participate in goal setting due to disorientation. CMS Medicare.gov Compare Post Acute Care list provided to:: Patient Represenative (must comment) (Vickie, sister in law) Choice offered to / list presented to : Sibling (sibling deceased/ list provided to  sister in law)  Discharge Placement              Patient chooses bed at: [facility] Patient to be transferred to facility by: [place] Name of family member notified: Thersa Salt Patient and family notified of of transfer: 04/14/20  Discharge Plan and Services In-house Referral: Clinical Social Work Discharge Planning Services: [employer] Consult Post Acute Care Choice: [place]          DME Arranged: N/A DME Agency: NA       HH Arranged: NA HH Agency: NA        Social Determinants of Health (SDOH) Interventions     Readmission Risk Interventions No flowsheet data found.

## 2020-04-14 NOTE — Progress Notes (Signed)
Physical Therapy Treatment Patient Details Name: Jesse Nunez MRN: [id number] DOB: [birth year] Today's Date: 04/14/2020    History of Present Illness 80 y.o. male with medical history significant for COPD, T2DM, hypertension prior strokes who presents to [facility] ED accompanied by sister-in-law due to generalized weakness and difficulty ambulating. Lives alone in [place] with aide that noted decline. Went home from [facility] ED with sister-in-law with plan to stay until admitted to ALF. Had 5 falls overnight and EMS called to help him up and brought to ED. 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 Recently put on full strength Valtrex due to shingles; ED physican noted too high a dose for his renal function and ?'s toxicity.    PT Comments    Patient asleep on arrival and very slow to awaken. Required increased assist for OOB to chair, however likely due to grogginess. Patient set up for breakfast and will attempt ambulation after he is more awake.     Follow Up Recommendations  SNF     Equipment Recommendations  None recommended by PT    Recommendations for Other Services       Precautions / Restrictions Precautions Precautions: Fall Precaution Comments: H/o several falls in the 24 hours PTA Restrictions Weight Bearing Restrictions: No    Mobility  Bed Mobility Overal bed mobility: Needs Assistance Bed Mobility: Supine to Sit     Supine to sit: Supervision     General bed mobility comments: increased time and energy as well as use of bedrail    Transfers Overall transfer level: Needs assistance Equipment used: 1 person hand held assist Transfers: Sit to/from Stand;Stand Pivot Transfers Sit to Stand: Mod assist         General transfer comment: able to stand on 2nd attempt with mod assist for anterior wt-shift and vertical translation  Ambulation/Gait Ambulation/Gait assistance: Min  assist Gait Distance (Feet): 3 Feet Assistive device: 1 person hand held assist Gait Pattern/deviations: Decreased stride length;Trunk flexed;Step-to pattern     General Gait Details: pt very sleepy and only safe to ambulate short distance to chair; set up with breakfast and will attempt ambulation when/if pt more alert   Stairs             Wheelchair Mobility    Modified Rankin (Stroke Patients Only)       Balance Overall balance assessment: Needs assistance Sitting-balance support: Feet supported Sitting balance-Leahy Scale: Good     Standing balance support: Single extremity supported Standing balance-Leahy Scale: Poor Standing balance comment: requires UE support                            Cognition Arousal/Alertness: Lethargic Behavior During Therapy: Flat affect Overall Cognitive Status: Within Functional Limits for tasks assessed                                        Exercises      General Comments        Pertinent Vitals/Pain Pain Assessment: Faces Faces Pain Scale: No hurt    Home Living Family/patient expects to be discharged to:: Skilled nursing facility Living Arrangements: Alone           Home Equipment: Shower seat;Cane - single point;Walker - 4 wheels Additional Comments: Pt was living alone in a condo/apartment PTA, but has  arranged to transition to [place] facility at discharge.  He reports he was planning to transition there prior to his illness    Prior Function Level of Independence: Independent      Comments: prior to recent illness did not have an aide and was independent (driving, groceries); using cane vs recent rollator   PT Goals (current goals can now be found in the care plan section) Acute Rehab PT Goals Patient Stated Goal: To feel better PT Goal Formulation: With patient Time For Goal Achievement: 04/26/20 Potential to Achieve Goals: Good Progress towards PT  goals: Not progressing toward goals - comment (lethargic)    Frequency    Min 3X/week      PT Plan Current plan remains appropriate    Co-evaluation              AM-PAC PT "6 Clicks" Mobility   Outcome Measure  Help needed turning from your back to your side while in a flat bed without using bedrails?: None Help needed moving from lying on your back to sitting on the side of a flat bed without using bedrails?: A Little Help needed moving to and from a bed to a chair (including a wheelchair)?: A Lot Help needed standing up from a chair using your arms (e.g., wheelchair or bedside chair)?: A Lot Help needed to walk in hospital room?: A Little Help needed climbing 3-5 steps with a railing? : Total 6 Click Score: 15    End of Session Equipment Utilized During Treatment: Gait belt Activity Tolerance: Patient limited by fatigue;Patient limited by lethargy Patient left: in chair;with call bell/phone within reach;with chair alarm set   PT Visit Diagnosis: Unsteadiness on feet (R26.81);Muscle weakness (generalized) (M62.81);History of falling (Z91.81)     Time: 5009-3818 PT Time Calculation (min) (ACUTE ONLY): 10 min  Charges:  $Therapeutic Activity: 8-22 mins                      Arby Barrette, PT Pager [phone]    Rexanne Mano 04/14/2020, 9:21 AM

## 2020-04-14 NOTE — Discharge Summary (Signed)
Discharge Summary  Jesse Nunez ZTI:[id number] DOB: [birth year]  PCP: System, Provider Not In  Admit date: 04/11/2020 Discharge date: 04/14/2020  Time spent: 30 mins  Recommendations for Outpatient Follow-up:  1. Follow-up with PCP   Discharge Diagnoses:  Active Hospital Problems   Diagnosis Date Noted  . Generalized weakness 04/11/2020  . Prolonged QT interval 04/12/2020  . Failure to thrive in adult 04/11/2020  . Hypokalemia 04/11/2020  . Dehydration 04/11/2020  . Fall at home, initial encounter 04/11/2020  . Shingles 04/11/2020    Resolved Hospital Problems  No resolved problems to display.    Discharge Condition: Stable  Diet recommendation: Mod carb diet  Vitals:   04/13/20 2107 04/14/20 0317  BP: (!) 157/90 (!) 148/90  Pulse: 81 82  Resp: 18 18  Temp: (!) 97.5 F (36.4 C) 97.6 F (36.4 C)  SpO2: 94% 94%    History of present illness:  Jesse Nunez a 80 y.o.malewith medical history significant forCOPD, T2DM, hypertension prior strokes [facility] by sister-in-law due to generalized weakness and difficulty ambulating. Patient was somnolent, though easily arousable. History was obtained from sister-in-law at bedside and ED physician. Per report, patient lives alone in [name], he has a home health nurse aide who reported that he has had a recent decrease in appetite and decreased ambulation (uses a Rollator to ambulate on his own). Hewas recently seen on 3/24 for shingles on his abdomen and was started on valacyclovir and pain medication, with increasing confusion. [facility], lab work done (CBC, CMP, thyroid, troponin, EKG, chest x-ray andurinalysis) showed no acute findings. Patient was discharged home with sister-in-law whomhe will stay with pending being able to go to an assisted living facility. Unfortunately, sister-in-law noted that patient was unable to get out of the car and had  difficulty in ambulation due to extreme weakness and multiple falls. EMS was activated and patient was taken to the ED for further evaluation and management.  In the ED, patient was hemodynamically stable, fairly stable, CT head/MRI brain showed no acute intracranial abnormality.  Admitted for further management.    Today, pt was able to participate with PT, reports generalized weakness, appears deconditioned. Denies any other complaints. Stable to d/c to SNF for further rehab needs.     Hospital Course:  Principal Problem:   Generalized weakness Active Problems:   Failure to thrive in adult   Hypokalemia   Dehydration   Fall at home, initial encounter   Shingles   Prolonged QT interval   Generalized weakness with recurrent falls Dehydration Possibly medication related, was taking valacyclovir was not renally adjusted Vs poor appetite, decreased ambulation Work-up so far including CT head/brain MRI negative for any acute changes S/p IV fluids PT/OT Fall precautions  Shingles on abdomen Weakness/confusion started after patient received valacyclovir Discontinue valacyclovir  Hypertension Continue lisinopril  Prolonged QTC Avoid QTC prolonging medications  CKD stage 3a Cr around baseline  Hyperlipidemia Continue Lipitor  COPD Continue albuterol    Estimated body mass index is 25.41 kg/m as calculated from the following:   Height as of this encounter: 5\' 11"  (1.803 m).   Weight as of this encounter: 82.6 kg.    Procedures:  None  Consultations:  None  Discharge Exam: BP (!) 148/90 (BP Location: Right Arm)   Pulse 82   Temp 97.6 F (36.4 C) (Oral)   Resp 18   Ht 5\' 11"  (1.803 m)   Wt 82.6 kg   SpO2  94%   BMI 25.41 kg/m   General: NAD, deconditioned Cardiovascular: S1, S2 present Respiratory: CTA B    Discharge Instructions You were cared for by a hospitalist during your hospital stay. If you have any questions about your discharge  medications or the care you received while you were in the hospital after you are discharged, you can call the unit and asked to speak with the hospitalist on call if the hospitalist that took care of you is not available. Once you are discharged, your primary care physician will handle any further medical issues. Please note that NO REFILLS for any discharge medications will be authorized once you are discharged, as it is imperative that you return to your primary care physician (or establish a relationship with a primary care physician if you do not have one) for your aftercare needs so that they can reassess your need for medications and monitor your lab values.  Discharge Instructions    Diet - low sodium heart healthy   Complete by: As directed    Increase activity slowly   Complete by: As directed      Allergies as of 04/14/2020      Reactions   Glipizide Nausea And Vomiting   Pt reported it being a new prescription and took 1 dose for 01/05/20 and had several episodes of vomiting. Pt went to the ER.      Medication List    TAKE these medications   albuterol 108 (90 Base) MCG/ACT inhaler Commonly known as: VENTOLIN HFA Inhale 2 puffs into the lungs every 6 (six) hours as needed for wheezing or shortness of breath.   aspirin EC 81 MG tablet Take 81 mg by mouth daily. Swallow whole.   atorvastatin 80 MG tablet Commonly known as: LIPITOR Take 1 tablet by mouth at bedtime.   colestipol 1 g tablet Commonly known as: COLESTID Take 1 tablet (1 g total) by mouth every morning.   feeding supplement Liqd Take 237 mLs by mouth 2 (two) times daily between meals.   ferrous sulfate 325 (65 FE) MG EC tablet Take 1 tablet (325 mg total) by mouth daily with breakfast.   lisinopril 20 MG tablet Commonly known as: ZESTRIL Take 0.5 tablets by mouth daily.   multivitamin with minerals Tabs tablet Take 1 tablet by mouth daily.   omeprazole 40 MG capsule Commonly known as: PRILOSEC Take  40 mg by mouth daily.   tamsulosin 0.4 MG Caps capsule Commonly known as: FLOMAX Take 1 capsule by mouth at bedtime.   Trelegy Ellipta 100-62.5-25 MCG/INH Aepb Generic drug: Fluticasone-Umeclidin-Vilant Inhale into the lungs.   zolpidem 5 MG tablet Commonly known as: AMBIEN Take 1 tablet (5 mg total) by mouth at bedtime. What changed:   medication strength  how much to take      Allergies  Allergen Reactions  . Glipizide Nausea And Vomiting    Pt reported it being a new prescription and took 1 dose for 01/05/20 and had several episodes of vomiting. Pt went to the ER.      The results of significant diagnostics from this hospitalization (including imaging, microbiology, ancillary and laboratory) are listed below for reference.    Significant Diagnostic Studies: CT Head Wo Contrast  Result Date: 04/11/2020 CLINICAL DATA:  Stroke suspected. Additional provided: Patient unable to stand. EXAM: CT HEAD WITHOUT CONTRAST TECHNIQUE: Contiguous axial images were obtained from the base of the skull through the vertex without intravenous contrast. COMPARISON:  MRI/MRA head 07/02/2013. CT head 07/02/2013.  FINDINGS: Brain: Moderate cerebral atrophy.  Comparatively mild cerebellar atrophy. There is a cortical/subcortical infarcts within the left parietooccipital lobes which is new as compared to the brain MRI of 07/02/2013, but appears chronic. Similarly, there is a cortical/subcortical infarct within the medial right temporal lobe which is new as compared to the prior MRI, but appears chronic. Suspected additional small chronic cortical infarct within the mid to posterior left frontal lobe (series 2, images 23 and 24). Chronic lacunar infarcts within the bilateral basal ganglia, left thalamus and left thalamocapsular junction. The basal ganglia lacunar infarcts are more numerous as compared to the prior brain MRI. Additionally, left thalamocapsular junction lacunar infarct is new from this prior  exam. Background moderate patchy and ill-defined hypoattenuation within the cerebral white matter which is nonspecific, but compatible with chronic small vessel ischemic disease. Redemonstrated chronic infarcts within the bilateral cerebellar hemispheres. There is no acute intracranial hemorrhage. No acute demarcated cortical infarct is identified. No extra-axial fluid collection. No evidence of intracranial mass. No midline shift. Vascular: No hyperdense vessel.  Atherosclerotic calcifications. Skull: Normal. Negative for fracture or focal lesion. Sinuses/Orbits: Visualized orbits show no acute finding. Mild bilateral ethmoid sinus mucosal thickening. Frothy secretions within the left sphenoid sinus. Small mucous retention cysts within the imaged bilateral maxillary sinuses. IMPRESSION: No CT evidence of acute intracranial abnormality. Cortical/subcortical infarcts within the left parietooccipital lobes and medial right temporal lobe are new from the brain MRI of 07/02/2013, but appear chronic. Suspected additional small chronic cortical infarct within the left frontal lobe, also new from the prior MRI. Chronic lacunar infarcts within the bilateral basal ganglia, left thalamus and left thalamocapsular junction, some of which are new from the prior brain MRI. Progressive moderate cerebral atrophy and background moderate cerebral white matter chronic small vessel ischemic disease. Redemonstrated chronic infarcts within the bilateral cerebellar hemispheres. Paranasal sinus disease as described. Correlate for acute sinusitis. Electronically Signed   By: Kellie Simmering DO   On: 04/11/2020 12:54   MR BRAIN WO CONTRAST  Result Date: 04/11/2020 CLINICAL DATA:  Neuro deficit.  Acute stroke suspected. EXAM: MRI HEAD WITHOUT CONTRAST TECHNIQUE: Multiplanar, multiecho pulse sequences of the brain and surrounding structures were obtained without intravenous contrast. COMPARISON:  CT head without contrast 04/11/2020. MR head  without contrast 07/02/2013. FINDINGS: Brain: Diffusion-weighted images demonstrate no acute or subacute infarction. Acute hemorrhage or mass lesion is present. Periventricular and subcortical T2 hyperintensities have increased since prior exam. Lacunar infarcts of the corona radiata bilaterally are new since the prior study but not acute. Cortical infarct of the left occipital lobe is new the prior exam but not acute. Progressive generalized atrophy is present. Bilateral remote thalamic infarcts are new since prior study, but not acute. White matter generation extends into the brainstem on the right. White matter changes are again noted in the pons. Increased number of remote lacunar infarcts are present within the cerebellum bilaterally. Vascular: Flow is present in the major intracranial arteries. Skull and upper cervical spine: The craniocervical junction is normal. Upper cervical spine is within normal limits. Marrow signal is unremarkable. Sinuses/Orbits: The paranasal sinuses and mastoid air cells are clear. Scratched at a polyp or mucous retention cyst is present in the left maxillary sinus. Mild mucosal thickening is present the anterior ethmoid air cells and frontal sinuses. No fluid levels are present. The globes and orbits are within normal limits. Other: IMPRESSION: 1. No acute intracranial abnormality. 2. Progressive atrophy and white matter disease. This likely reflects the sequela of chronic microvascular  ischemia. 3. 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 Electronically Signed   By: San Morelle M.D.   On: 04/11/2020 18:26    Microbiology: Recent Results (from the past 240 hour(s))  SARS CORONAVIRUS 2 (TAT 6-24 HRS) Nasopharyngeal Nasopharyngeal Swab     Status: None   Collection Time: 04/11/20  1:51 PM   Specimen: Nasopharyngeal Swab  Result Value Ref Range Status   SARS Coronavirus 2  NEGATIVE NEGATIVE Final    Comment: (NOTE) SARS-CoV-2 target nucleic acids are NOT DETECTED.  The SARS-CoV-2 RNA is generally detectable in upper and lower respiratory specimens during the acute phase of infection. Negative results do not preclude SARS-CoV-2 infection, do not rule out co-infections with other pathogens, and should not be used as the sole basis for treatment or other patient management decisions. Negative results must be combined with clinical observations, patient history, and epidemiological information. The expected result is Negative.  Fact Sheet for Patients: SugarRoll.be  Fact Sheet for Healthcare Providers: https://www.woods-mathews.com/  This test is not yet approved or cleared by the [place] FDA and  has been authorized for detection and/or diagnosis of SARS-CoV-2 by FDA under an Emergency Use Authorization (EUA). This EUA will remain  in effect (meaning this test can be used) for the duration of the COVID-19 declaration under Se ction 564(b)(1) of the Act, 21 U.S.C. section 360bbb-3(b)(1), unless the authorization is terminated or revoked sooner.  Performed at [facility], [place] [address] [place], [place] [address]   Resp Panel by RT-PCR (Flu A&B, Covid) Nasopharyngeal Swab     Status: None   Collection Time: 04/14/20 11:42 AM   Specimen: Nasopharyngeal Swab; Nasopharyngeal(NP) swabs in vial transport medium  Result Value Ref Range Status   SARS Coronavirus 2 by RT PCR NEGATIVE NEGATIVE Final    Comment: (NOTE) SARS-CoV-2 target nucleic acids are NOT DETECTED.  The SARS-CoV-2 RNA is generally detectable in upper respiratory specimens during the acute phase of infection. The lowest concentration of SARS-CoV-2 viral copies this assay can detect is 138 copies/mL. A negative result does not preclude SARS-Cov-2 infection and should not be used as the sole basis for treatment or other patient  management decisions. A negative result may occur with  improper specimen collection/handling, submission of specimen other than nasopharyngeal swab, presence of viral mutation(s) within the areas targeted by this assay, and inadequate number of viral copies(<138 copies/mL). A negative result must be combined with clinical observations, patient history, and epidemiological information. The expected result is Negative.  Fact Sheet for Patients:  EntrepreneurPulse.com.au  Fact Sheet for Healthcare Providers:  IncredibleEmployment.be  This test is no t yet approved or cleared by the [place] FDA and  has been authorized for detection and/or diagnosis of SARS-CoV-2 by FDA under an Emergency Use Authorization (EUA). This EUA will remain  in effect (meaning this test can be used) for the duration of the COVID-19 declaration under Section 564(b)(1) of the Act, 21 U.S.C.section 360bbb-3(b)(1), unless the authorization is terminated  or revoked sooner.       Influenza A by PCR NEGATIVE NEGATIVE Final   Influenza B by PCR NEGATIVE NEGATIVE Final    Comment: (NOTE) The Xpert Xpress SARS-CoV-2/FLU/RSV plus assay is intended as an aid in the diagnosis of influenza from Nasopharyngeal swab specimens and should not be used as a sole basis for treatment. Nasal washings and aspirates are unacceptable for Xpert Xpress SARS-CoV-2/FLU/RSV testing.  Fact Sheet for Patients: EntrepreneurPulse.com.au  Fact Sheet for Healthcare Providers: IncredibleEmployment.be  This test is not yet approved or cleared by the [place] FDA and has been authorized for detection and/or diagnosis of SARS-CoV-2 by FDA under an Emergency Use Authorization (EUA). This EUA will remain in effect (meaning this test can be used) for the duration of the COVID-19 declaration under Section 564(b)(1) of the Act, 21 U.S.C. section 360bbb-3(b)(1),  unless the authorization is terminated or revoked.  Performed at [facility], [place] [address] [place], [place] [address]      Labs: Basic Metabolic Panel: Recent Labs  Lab 04/11/20 1217 04/12/20 0531 04/13/20 0051  NA 139 137 140  K 3.4* 3.6 4.4  CL 106 108 108  CO2 21* 20* 24  GLUCOSE 109* 128* 142*  BUN 19 17 16   CREATININE 1.46* 1.46* 1.63*  CALCIUM 9.5 9.0 9.3  MG  --  1.9  --   PHOS  --  3.3  --    Liver Function Tests: Recent Labs  Lab 04/12/20 0531  AST 33  ALT 31  ALKPHOS 64  BILITOT 1.4*  PROT 7.1  ALBUMIN 3.1*   No results for input(s): LIPASE, AMYLASE in the last 168 hours. No results for input(s): AMMONIA in the last 168 hours. CBC: Recent Labs  Lab 04/11/20 1217 04/12/20 0531 04/13/20 0051  WBC 8.4 7.0 7.3  NEUTROABS 5.7  --  4.0  HGB 12.6* 12.0* 12.2*  HCT 37.2* 34.9* 36.4*  MCV 87.7 86.6 88.8  PLT 196 198 226   Cardiac Enzymes: No results for input(s): CKTOTAL, CKMB, CKMBINDEX, TROPONINI in the last 168 hours. BNP: BNP (last 3 results) No results for input(s): BNP in the last 8760 hours.  ProBNP (last 3 results) No results for input(s): PROBNP in the last 8760 hours.  CBG: Recent Labs  Lab 04/11/20 1201  GLUCAP 111*       Signed:  Alma Friendly, MD [facility] 04/14/2020, 2:17 PM

## 2020-04-14 NOTE — Progress Notes (Addendum)
Physical Therapy Treatment Patient Details Name: Jesse Nunez MRN: [id number] DOB: 08-Nov-1940 Today's Date: 04/14/2020    History of Present Illness 80 y.o. male with medical history significant for COPD, T2DM, hypertension prior strokes who presents to [facility] ED accompanied by sister-in-law due to generalized weakness and difficulty ambulating. Lives alone in [place] with aide that noted decline. Went home from [facility] ED with sister-in-law with plan to stay until admitted to ALF. Had 5 falls overnight and EMS called to help him up and brought to ED. 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 Recently put on full strength Valtrex due to shingles; ED physican noted too high a dose for his renal function and ?'s toxicity.    PT Comments    Patient much more alert and eager to work with PT. Transfers improved with less physical assist (min assist). Tolerated 160 ft ambulation with RW and min-guard assist.  Patient does not normally walk with RW and required cue x1 to avoid obstacle. Can continue to benefit from DME instruction, gait and balance training.    Follow Up Recommendations  SNF     Equipment Recommendations  None recommended by PT    Recommendations for Other Services       Precautions / Restrictions Precautions Precautions: Fall Precaution Comments: H/o several falls in the 24 hours PTA    Mobility  Bed Mobility Overal bed mobility: Needs Assistance Bed Mobility: Supine to Sit     Supine to sit: Supervision     General bed mobility comments: increased time and energy    Transfers Overall transfer level: Needs assistance Equipment used: Rolling walker (2 wheeled) Transfers: Sit to/from Stand Sit to Stand: Min assist         General transfer comment: vc for hand placement  Ambulation/Gait Ambulation/Gait assistance: Min assist Gait Distance (Feet): 160 Feet Assistive device:  Rolling walker (2 wheeled) Gait Pattern/deviations: Decreased stride length;Trunk flexed;Step-through pattern     General Gait Details: more upright with RW properly set   Stairs             Wheelchair Mobility    Modified Rankin (Stroke Patients Only)       Balance Overall balance assessment: Needs assistance Sitting-balance support: Feet supported Sitting balance-Leahy Scale: Good     Standing balance support: Single extremity supported Standing balance-Leahy Scale: Poor Standing balance comment: requires UE support                            Cognition Arousal/Alertness: Awake/alert Behavior During Therapy: WFL for tasks assessed/performed Overall Cognitive Status: Within Functional Limits for tasks assessed                                 General Comments: Cognition appears [facility] for basic ADLs and basic tasks.  He seems to have a good awareness of his deficits, good safety awareness, and good deductive reasoning, and good recall of recent events.  Higher level cognition not assessed      Exercises      General Comments        Pertinent Vitals/Pain Pain Assessment: Faces Faces Pain Scale: No hurt    Home Living Family/patient expects to be discharged to:: Skilled nursing facility Living Arrangements: Alone           Home Equipment: Shower seat;Cane - single point;Walker -  4 wheels Additional Comments: Pt was living alone in a condo/apartment PTA, but has arranged to transition to [place] facility at discharge.  He reports he was planning to transition there prior to his illness    Prior Function Level of Independence: Independent      Comments: prior to recent illness did not have an aide and was independent (driving, groceries); using cane vs recent rollator   PT Goals (current goals can now be found in the care plan section) Acute Rehab PT Goals Patient Stated Goal: To feel better PT Goal  Formulation: With patient Time For Goal Achievement: 04/26/20 Potential to Achieve Goals: Good Progress towards PT goals: Progressing toward goals    Frequency    Min 2X/week      PT Plan Current plan remains appropriate    Co-evaluation              AM-PAC PT "6 Clicks" Mobility   Outcome Measure  Help needed turning from your back to your side while in a flat bed without using bedrails?: None Help needed moving from lying on your back to sitting on the side of a flat bed without using bedrails?: A Little Help needed moving to and from a bed to a chair (including a wheelchair)?: A Little Help needed standing up from a chair using your arms (e.g., wheelchair or bedside chair)?: A Little Help needed to walk in hospital room?: A Little Help needed climbing 3-5 steps with a railing? : Total 6 Click Score: 17    End of Session Equipment Utilized During Treatment: Gait belt Activity Tolerance: Patient tolerated treatment well Patient left: with call bell/phone within reach;in bed;with bed alarm set (pt wanting to sleep) Nurse Communication: Mobility status PT Visit Diagnosis: Unsteadiness on feet (R26.81);Muscle weakness (generalized) (M62.81);History of falling (Z91.81)     Time: 6045-4098 PT Time Calculation (min) (ACUTE ONLY): 16 min  Charges:  $Gait Training: 8-22 mins $Therapeutic Activity: 8-22 mins                      Arby Barrette, PT Pager [phone]    Rexanne Mano 04/14/2020, 12:35 PM

## 2020-04-14 NOTE — TOC Progression Note (Signed)
Transition of Care [facility]) - Progression Note    Patient Details  Name: Jesse Nunez MRN: [id number] Date of Birth: 19-May-1940  Transition of Care [facility]) CM/SW Bricelyn, RN Phone Number: [phone]  04/14/2020, 2:22 PM  Clinical Narrative:    CM spoke with [facility] to confirm bed offer. Patient has been accepted. Covid and d/c summary pending.   Expected Discharge Plan: Skilled Nursing Facility Barriers to Discharge: [place] Work up,SNF Pending bed offer  Expected Discharge Plan and Services Expected Discharge Plan: [place] In-house Referral: Clinical Social Work Discharge Planning Services: CM Consult Post Acute Care Choice: [place] Living arrangements for the past 2 months: Apartment Expected Discharge Date: 04/14/20                                     Social Determinants of Health (SDOH) Interventions    Readmission Risk Interventions No flowsheet data found.

## 2020-05-08 ENCOUNTER — Ambulatory Visit
Admission: RE | Admit: 2020-05-08 | Discharge: 2020-05-08 | Disposition: A | Discharge status: Home / Self Care | Payer: Medicare Other | Source: Ambulatory Visit | Attending: Internal Medicine | Admitting: Internal Medicine

## 2020-05-08 ENCOUNTER — Other Ambulatory Visit: Payer: Self-pay | Admitting: Internal Medicine

## 2020-05-08 DIAGNOSIS — R1032 Left lower quadrant pain: Secondary | ICD-10-CM

## 2020-05-08 MED ORDER — IOPAMIDOL (ISOVUE-300) INJECTION 61%
80.0000 mL | Freq: Once | INTRAVENOUS | Status: AC | PRN
  Administered 2020-05-08: 80 mL via INTRAVENOUS

## 2020-05-29 ENCOUNTER — Encounter: Payer: Self-pay | Admitting: Gastroenterology

## 2020-06-24 ENCOUNTER — Ambulatory Visit (INDEPENDENT_AMBULATORY_CARE_PROVIDER_SITE_OTHER): Payer: Medicare Other | Admitting: Gastroenterology

## 2020-06-24 ENCOUNTER — Encounter: Payer: Self-pay | Admitting: Gastroenterology

## 2020-06-24 ENCOUNTER — Other Ambulatory Visit: Payer: Self-pay

## 2020-06-24 VITALS — BP 140/90 | HR 72 | Ht 71.0 in | Wt 159.2 lb

## 2020-06-24 DIAGNOSIS — Z800 Family history of malignant neoplasm of digestive organs: Secondary | ICD-10-CM | POA: Insufficient documentation

## 2020-06-24 DIAGNOSIS — K5909 Other constipation: Secondary | ICD-10-CM | POA: Diagnosis not present

## 2020-06-24 DIAGNOSIS — R1032 Left lower quadrant pain: Secondary | ICD-10-CM | POA: Diagnosis not present

## 2020-06-24 DIAGNOSIS — R634 Abnormal weight loss: Secondary | ICD-10-CM | POA: Diagnosis not present

## 2020-06-24 MED ORDER — NA SULFATE-K SULFATE-MG SULF 17.5-3.13-1.6 GM/177ML PO SOLN: 1.0000 | Freq: Once | ORAL | 0 refills | Status: DC

## 2020-06-24 MED ORDER — NA SULFATE-K SULFATE-MG SULF 17.5-3.13-1.6 GM/177ML PO SOLN: 1.0000 | Freq: Once | ORAL | 0 refills | Status: AC

## 2020-06-24 NOTE — Progress Notes (Signed)
06/24/2020 Jesse Nunez [id number] Apr 14, 1940   HISTORY OF PRESENT ILLNESS: This is a 80 year old male who is new to our office.  Has been referred here by Claud Kelp, NP, for evaluation of abdominal pain.  He reports left-sided abdominal pain for the past several weeks.  He tells me that pain gets better with Tylenol.  He had shingles on his left back/abdomen.  He reports constipation and says that he has tried fiber supplements, stool softeners, MiraLAX in the past without much improvement with that.  His weight is down 23 pounds since April.  He had a CT scan of the abdomen and pelvis with contrast on April 28 that showed no acute findings to account for his pain.  He reports a strong family history of colon cancer in his mother, brother, and both of his maternal grandparents.  His last colonoscopy was in October 2016 at [facility] as follows:    - The examined portion of the ileum was normal.                    - Severe diverticulosis in the sigmoid colon, in the                    descending colon, at the splenic flexure and in the                    transverse colon. There was narrowing of the colon in                    association with the diverticular opening. There was                    evidence of diverticular spasm. Erythema was seen in                    association with the diverticular opening.                    - One 4 mm polyp in the transverse colon, removed with a                    cold biopsy forceps. Resected and retrieved.                    - One 5 mm polyp in the sigmoid colon, removed with a cold                    snare. Resected and retrieved. Clip was placed.                    -Non-bleeding internal hemorrhoids.  One polyp was hyperplastic and another was an inflammatory polyp  He denies rectal bleeding.  He does use oxygen at bedtime.   Past Medical History:  Diagnosis Date   COPD (chronic obstructive pulmonary disease) ([place])    Depression     Diabetes ([place])    Family history of colon cancer    Hyperlipidemia    Hypertension    Macular degeneration disease    Shingles    Past Surgical History:  Procedure Laterality Date   APPENDECTOMY     COLONOSCOPY     TONSILLECTOMY     UPPER GASTROINTESTINAL ENDOSCOPY      reports that he has quit smoking. He has never used smokeless tobacco. He reports that he does not drink alcohol and does not  use drugs. family history includes Colon cancer in his brother, maternal grandfather, and mother; Colon polyps in his maternal grandfather; Hypertension in his father and mother; Pancreatitis in his father. Allergies  Allergen Reactions   Glipizide Nausea And Vomiting    Pt reported it being a new prescription and took 1 dose for 01/05/20 and had several episodes of vomiting. Pt went to the ER.      Outpatient Encounter Medications as of 06/24/2020  Medication Sig   albuterol (PROVENTIL HFA;VENTOLIN HFA) 108 (90 BASE) MCG/ACT inhaler Inhale 2 puffs into the lungs every 6 (six) hours as needed for wheezing or shortness of breath.   aspirin EC 81 MG tablet Take 81 mg by mouth daily. Swallow whole.   atorvastatin (LIPITOR) 80 MG tablet Take 1 tablet by mouth at bedtime.   ferrous sulfate 325 (65 FE) MG EC tablet Take 1 tablet (325 mg total) by mouth daily with breakfast.   lisinopril (ZESTRIL) 20 MG tablet Take 0.5 tablets by mouth daily.   Multiple Vitamin (MULTIVITAMIN WITH MINERALS) TABS tablet Take 1 tablet by mouth daily.   omeprazole (PRILOSEC) 20 MG capsule Take 1 capsule by mouth daily.   tamsulosin (FLOMAX) 0.4 MG CAPS capsule Take 1 capsule by mouth at bedtime.   zolpidem (AMBIEN) 5 MG tablet Take 1 tablet (5 mg total) by mouth at bedtime.   [DISCONTINUED] colestipol (COLESTID) 1 G tablet Take 1 tablet (1 g total) by mouth every morning. (Patient not taking: Reported on 06/24/2020)   [DISCONTINUED] feeding supplement (ENSURE ENLIVE / ENSURE PLUS) LIQD Take 237 mLs by mouth 2 (two) times  daily between meals. (Patient not taking: Reported on 06/24/2020)   [DISCONTINUED] Fluticasone-Umeclidin-Vilant (TRELEGY ELLIPTA) 100-62.5-25 MCG/INH AEPB Inhale into the lungs. (Patient not taking: Reported on 06/24/2020)   [DISCONTINUED] gabapentin (NEURONTIN) 100 MG capsule Take 100 mg by mouth 3 (three) times daily. (Patient not taking: Reported on 06/24/2020)   [DISCONTINUED] omeprazole (PRILOSEC) 40 MG capsule Take 40 mg by mouth daily. (Patient not taking: Reported on 06/24/2020)   [DISCONTINUED] PARoxetine (PAXIL) 10 MG tablet Take 10 mg by mouth daily. (Patient not taking: Reported on 06/24/2020)   [DISCONTINUED] zolpidem (AMBIEN) 10 MG tablet Take 10 mg by mouth at bedtime as needed. (Patient not taking: Reported on 06/24/2020)   No facility-administered encounter medications on file as of 06/24/2020.     REVIEW OF SYSTEMS  : All other systems reviewed and negative except where noted in the History of Present Illness.   PHYSICAL EXAM: BP 140/90   Pulse 72   Ht 5\' 11"  (1.803 m)   Wt 159 lb 3.2 oz (72.2 kg)   SpO2 96%   BMI 22.20 kg/m  General: Well developed white male in no acute distress Head: Normocephalic and atraumatic Eyes:  Sclerae anicteric, conjunctiva pink. Ears: Normal auditory acuity Lungs: Clear throughout to auscultation; no W/R/R. Heart: Regular rate and rhythm; no M/R/G. Abdomen: Soft, non-distended.  BS present.  Minimal left sided TTP.  Significant well-healing shingles outbreak noted on the left. Rectal:  Will be done at the time of colonoscopy. Musculoskeletal: Symmetrical with no gross deformities  Skin: No lesions on visible extremities Extremities: No edema  Neurological: Alert oriented x 4, grossly non-focal Psychological:  Alert and cooperative. Normal mood and affect  ASSESSMENT AND PLAN: *Chronic constipation:  Says that he has tried fiber supplements, stool softeners, and Miralax in the past.  Will try Linzess 72 mcg daily, samples give.  Will get  back in touch with  [place] in 7-10 days to see if we need to change the dose, etc, and we can send a prescription to pharmacy if needed at that time.   *Weight loss:  Down 23 pounds since April. *Family history of colon cancer: Strong family history of colon cancer in his mother, brother, and both maternal grandparents.  Last colonoscopy 10/2014. *Left-sided abdominal pain: I really think that his pain is related to his shingles and postherpetic neuralgia.  He still has significant healing rash unilaterally on his left back and abdomen.  Could also be from constipation and we are going to treat that as above.  Has severe diverticular disease, but no acute diverticulitis was noted on recent CT scan.  In light of his recent weight loss as well we will plan for colonoscopy woth Dr. Tarri Glenn.  This will be done at [facility] since he uses nocturnal oxygen.  The risks, benefits, and alternatives to colonoscopy were discussed with the patient and he consents to proceed.    CC:  Geoffery Lyons, NP

## 2020-06-24 NOTE — Patient Instructions (Signed)
Linzess 72 mcg daily 30 minutes before breakfast (samples).  Call back with an update in 7-10 days, ask for Patty, RN  You have been scheduled for a colonoscopy. Please follow written instructions given to you at your visit today.  Please pick up your prep supplies at the pharmacy within the next 1-3 days. If you use inhalers (even only as needed), please bring them with you on the day of your procedure.  If you are age 80 or older, your body mass index should be between 23-30. Your Body mass index is 22.2 kg/m. If this is out of the aforementioned range listed, please consider follow up with your Primary Care Provider.  If you are age 50 or younger, your body mass index should be between 19-25. Your Body mass index is 22.2 kg/m. If this is out of the aformentioned range listed, please consider follow up with your Primary Care Provider.   __________________________________________________________  The [place] [facility] providers would like to encourage you to use [facility] to communicate with providers for non-urgent requests or questions.  Due to long hold times on the telephone, sending your provider a message by [facility] may be a faster and more efficient way to get a response.  Please allow 48 business hours for a response.  Please remember that this is for non-urgent requests.

## 2020-06-24 NOTE — Addendum Note (Signed)
Addended by: Horris Latino on: 06/24/2020 01:34 PM   Modules accepted: Orders

## 2020-06-24 NOTE — Progress Notes (Signed)
Reviewed and agree with management plans. May schedule hospital procedure with me during any hospital time available over the next couple of weeks.   Zelina Jimerson L. Tarri Glenn, MD, MPH

## 2020-06-27 ENCOUNTER — Telehealth: Payer: Self-pay | Admitting: Gastroenterology

## 2020-06-27 NOTE — Telephone Encounter (Signed)
Left message for patient to call office.  

## 2020-06-27 NOTE — Telephone Encounter (Signed)
Inbound call from patient. States he received lizness samples and do not understand how to take them. Best contact number [phone].

## 2020-06-27 NOTE — Telephone Encounter (Signed)
Spoke with patient and explain how to take the [place] again.

## 2020-06-30 ENCOUNTER — Telehealth: Payer: Self-pay | Admitting: Gastroenterology

## 2020-06-30 NOTE — Telephone Encounter (Signed)
Inbound call from pt requesting a call back stating he has questions regarding the medication Linzess that was prescribed. Please advise. Thanks

## 2020-07-02 NOTE — Telephone Encounter (Signed)
Attempted to call patient. Call straight to VM. LMTCB

## 2020-07-02 NOTE — Telephone Encounter (Signed)
Attempted to call patient. No answer. LMTCB

## 2020-07-03 NOTE — Telephone Encounter (Signed)
Called patient back and got voice mail. Left message again to please call back

## 2020-07-03 NOTE — Telephone Encounter (Signed)
Patient returning nurses call

## 2020-07-07 NOTE — Telephone Encounter (Signed)
Unable to reach pt will await further calls from pt

## 2020-07-22 ENCOUNTER — Telehealth: Payer: Self-pay | Admitting: Gastroenterology

## 2020-07-22 MED ORDER — NA SULFATE-K SULFATE-MG SULF 17.5-3.13-1.6 GM/177ML PO SOLN: 1.0000 | Freq: Once | ORAL | 0 refills | Status: AC

## 2020-07-22 NOTE — Telephone Encounter (Signed)
Sent script to Suprep to the correct pharmacy.

## 2020-07-22 NOTE — Telephone Encounter (Signed)
Inbound call from patient stating Suprep was sent to wrong pharmacy and is requesting for it to be sent to [facility] [place] please.

## 2020-07-23 NOTE — Progress Notes (Signed)
Attempted to obtain medical history via telephone, unable to reach at this time. I left a voicemail to return pre surgical testing department's phone call.  

## 2020-07-25 ENCOUNTER — Other Ambulatory Visit: Payer: Self-pay

## 2020-07-25 ENCOUNTER — Encounter (HOSPITAL_COMMUNITY): Payer: Self-pay | Admitting: Gastroenterology

## 2020-07-28 ENCOUNTER — Encounter (HOSPITAL_COMMUNITY): Payer: Self-pay | Admitting: Gastroenterology

## 2020-07-28 ENCOUNTER — Encounter (HOSPITAL_COMMUNITY)
Admission: RE | Disposition: A | Discharge status: Home / Self Care | Payer: Self-pay | Source: Home / Self Care | Attending: Gastroenterology

## 2020-07-28 ENCOUNTER — Ambulatory Visit (HOSPITAL_COMMUNITY): Payer: Medicare Other | Admitting: Anesthesiology

## 2020-07-28 ENCOUNTER — Other Ambulatory Visit: Payer: Self-pay

## 2020-07-28 ENCOUNTER — Ambulatory Visit (HOSPITAL_COMMUNITY)
Admission: RE | Admit: 2020-07-28 | Discharge: 2020-07-28 | Disposition: A | Discharge status: Home / Self Care | Payer: Medicare Other | Attending: Gastroenterology | Admitting: Gastroenterology

## 2020-07-28 DIAGNOSIS — D122 Benign neoplasm of ascending colon: Secondary | ICD-10-CM

## 2020-07-28 DIAGNOSIS — Z6824 Body mass index (BMI) 24.0-24.9, adult: Secondary | ICD-10-CM | POA: Insufficient documentation

## 2020-07-28 DIAGNOSIS — K644 Residual hemorrhoidal skin tags: Secondary | ICD-10-CM | POA: Diagnosis not present

## 2020-07-28 DIAGNOSIS — R634 Abnormal weight loss: Secondary | ICD-10-CM | POA: Diagnosis not present

## 2020-07-28 DIAGNOSIS — Z79899 Other long term (current) drug therapy: Secondary | ICD-10-CM | POA: Insufficient documentation

## 2020-07-28 DIAGNOSIS — D120 Benign neoplasm of cecum: Secondary | ICD-10-CM | POA: Insufficient documentation

## 2020-07-28 DIAGNOSIS — Z800 Family history of malignant neoplasm of digestive organs: Secondary | ICD-10-CM | POA: Diagnosis not present

## 2020-07-28 DIAGNOSIS — K5730 Diverticulosis of large intestine without perforation or abscess without bleeding: Secondary | ICD-10-CM | POA: Diagnosis not present

## 2020-07-28 DIAGNOSIS — R1032 Left lower quadrant pain: Secondary | ICD-10-CM | POA: Diagnosis not present

## 2020-07-28 DIAGNOSIS — Z8719 Personal history of other diseases of the digestive system: Secondary | ICD-10-CM | POA: Diagnosis not present

## 2020-07-28 DIAGNOSIS — K648 Other hemorrhoids: Secondary | ICD-10-CM | POA: Insufficient documentation

## 2020-07-28 HISTORY — DX: Sleep apnea, unspecified: G47.30

## 2020-07-28 HISTORY — PX: POLYPECTOMY: SHX5525

## 2020-07-28 HISTORY — PX: COLONOSCOPY WITH PROPOFOL: SHX5780

## 2020-07-28 LAB — GLUCOSE, CAPILLARY
Glucose-Capillary: 155 mg/dL — ABNORMAL HIGH (ref 70–99)
Glucose-Capillary: 136 mg/dL — ABNORMAL HIGH (ref 70–99)

## 2020-07-28 SURGERY — COLONOSCOPY WITH PROPOFOL
Anesthesia: Monitor Anesthesia Care

## 2020-07-28 MED ORDER — SODIUM CHLORIDE 0.9 % IV SOLN: INTRAVENOUS | Status: DC

## 2020-07-28 MED ORDER — LACTATED RINGERS IV SOLN
INTRAVENOUS | Status: DC
  Administered 2020-07-28: 1000 mL via INTRAVENOUS

## 2020-07-28 MED ORDER — PROPOFOL 500 MG/50ML IV EMUL
INTRAVENOUS | Status: AC
  Filled 2020-07-28: qty 50

## 2020-07-28 MED ORDER — PROPOFOL 10 MG/ML IV BOLUS
INTRAVENOUS | Status: DC | PRN
  Administered 2020-07-28: 30 mg via INTRAVENOUS

## 2020-07-28 MED ORDER — PROPOFOL 500 MG/50ML IV EMUL
INTRAVENOUS | Status: DC | PRN
  Administered 2020-07-28: 125 ug/kg/min via INTRAVENOUS

## 2020-07-28 SURGICAL SUPPLY — 21 items

## 2020-07-28 NOTE — Discharge Instructions (Signed)

## 2020-07-28 NOTE — Transfer of Care (Signed)
Immediate Anesthesia Transfer of Care Note  Patient: Jesse Nunez  Procedure(s) Performed: COLONOSCOPY WITH PROPOFOL POLYPECTOMY  Patient Location: PACU and Endoscopy Unit  Anesthesia Type:MAC  Level of Consciousness: drowsy and responds to stimulation  Airway & Oxygen Therapy: Patient Spontanous Breathing and Patient connected to face mask oxygen  Post-op Assessment: Report given to RN and Post -op Vital signs reviewed and stable  Post vital signs: Reviewed and stable  Last Vitals:  Vitals Value Taken Time  BP    Temp    Pulse 87 07/28/20 1201  Resp 16 07/28/20 1201  SpO2 99 % 07/28/20 1201  Vitals shown include unvalidated device data.  Last Pain:  Vitals:   07/28/20 1059  TempSrc: Oral  PainSc: 0-No pain         Complications: No notable events documented.

## 2020-07-28 NOTE — H&P (Signed)
   Referring Provider: No ref. provider found Primary Care Physician:  [facility] [place]  Reason for Consultation:  Weight loss, abdominal pain   IMPRESSION:  Unexplained weight loss LLQ abdominal pain History of colon polyps History of sigmoid diverticulosis Family history of colon cancer  PLAN: Colonoscopy  Please see the "Patient Instructions" section for addition details about the plan.  HPI: Jesse Nunez is a 80 y.o. male recently seen in the office by Alonza Bogus for unexplained weight loss, left-sided abdominal pain, and constipation. CT abd/pelvis with contrast was non-diagnostic in April 2022. Please see her note for complete details. Colonoscopy recommended given his history of colon polyps and family history of colon cancer.   Last colonoscopy 10/26 showed severe diverticulosis in the colon, a hyperplastic polyp, an inflammatory polyps, and internal hemorrhoids.   Mother, brother, and both maternal grandparents with colon cancer. No other known family history of colon cancer or polyps. No family history of uterine/endometrial cancer, pancreatic cancer or gastric/stomach cancer.   Past Medical History:  Diagnosis Date   COPD (chronic obstructive pulmonary disease) ([place])    Depression    Diabetes ([facility])    Family history of colon cancer    Hyperlipidemia    Hypertension    Macular degeneration disease    Shingles    Sleep apnea    Uses CPAP sometimes    Past Surgical History:  Procedure Laterality Date   APPENDECTOMY     COLONOSCOPY     TONSILLECTOMY     UPPER GASTROINTESTINAL ENDOSCOPY      Current Facility-Administered Medications  Medication Dose Route Frequency Provider Last Rate Last Admin   0.9 %  sodium chloride infusion   Intravenous Continuous Zehr, Jessica D, PA-C       lactated ringers infusion   Intravenous Continuous [place], MD        Allergies as of 06/24/2020 - Review Complete 06/24/2020  Allergen Reaction Noted    Glipizide Nausea And Vomiting 02/04/2020    Family History  Problem Relation Age of Onset   Colon cancer Mother    Hypertension Mother    Hypertension Father    Pancreatitis Father    Colon cancer Brother    Colon cancer Maternal Grandfather    Colon polyps Maternal Grandfather       Physical Exam: General:   Alert,  well-nourished, pleasant and cooperative in NAD Head:  Normocephalic and atraumatic. Eyes:  Sclera clear, no icterus.   Conjunctiva pink. Ears:  Normal auditory acuity. Nose:  No deformity, discharge,  or lesions. Mouth:  No deformity or lesions.   Neck:  Supple; no masses or thyromegaly. Lungs:  Clear throughout to auscultation.   No wheezes. Heart:  Regular rate and rhythm; no murmurs. Abdomen:  Soft,nontender, nondistended, normal bowel sounds, no rebound or guarding. No hepatosplenomegaly.   Rectal:  Deferred  Msk:  Symmetrical. No boney deformities LAD: No inguinal or umbilical LAD Extremities:  No clubbing or edema. Neurologic:  Alert and  oriented x4;  grossly nonfocal Skin:  Intact without significant lesions or rashes. Psych:  Alert and cooperative. Normal mood and affect.    [name] L. Tarri Glenn, MD, MPH 07/28/2020, 10:38 AM

## 2020-07-28 NOTE — Anesthesia Postprocedure Evaluation (Signed)
Anesthesia Post Note  Patient: Jesse Nunez  Procedure(s) Performed: COLONOSCOPY WITH PROPOFOL POLYPECTOMY     Patient location during evaluation: Endoscopy Anesthesia Type: MAC Level of consciousness: awake and alert Pain management: pain level controlled Vital Signs Assessment: post-procedure vital signs reviewed and stable Respiratory status: spontaneous breathing, nonlabored ventilation and respiratory function stable Cardiovascular status: stable and blood pressure returned to baseline Postop Assessment: no apparent nausea or vomiting Anesthetic complications: no   No notable events documented.  Last Vitals:  Vitals:   07/28/20 1210 07/28/20 1220  BP: (!) 159/82 (!) 172/85  Pulse: 77 74  Resp: 16 18  Temp:    SpO2: 100% 94%    Last Pain:  Vitals:   07/28/20 1220  TempSrc:   PainSc: 0-No pain                 Catalina Gravel

## 2020-07-28 NOTE — Anesthesia Preprocedure Evaluation (Addendum)
Anesthesia Evaluation  Patient identified by MRN, date of birth, ID band Patient awake    Reviewed: Allergy & Precautions, NPO status , Patient's Chart, lab work & pertinent test results  Airway Mallampati: II  TM Distance: >3 FB Neck ROM: Full    Dental  (+) Teeth Intact, Dental Advisory Given, Caps   Pulmonary sleep apnea and Continuous Positive Airway Pressure Ventilation , COPD,  COPD inhaler, former smoker,    Pulmonary exam normal breath sounds clear to auscultation       Cardiovascular hypertension, Pt. on medications Normal cardiovascular exam Rhythm:Regular Rate:Normal     Neuro/Psych PSYCHIATRIC DISORDERS Depression H/o SAH    GI/Hepatic Neg liver ROS, GERD  Medicated,weight loss, llq abd pain, fam history of colon ca   Endo/Other  diabetes, Type 2, Oral Hypoglycemic Agents  Renal/GU negative Renal ROS     Musculoskeletal negative musculoskeletal ROS (+)   Abdominal   Peds  Hematology negative hematology ROS (+)   Anesthesia Other Findings Day of surgery medications reviewed with the patient.  Reproductive/Obstetrics                            Anesthesia Physical Anesthesia Plan  ASA: 3  Anesthesia Plan: MAC   Post-op Pain Management:    Induction: Intravenous  PONV Risk Score and Plan: 1 and Propofol infusion and Treatment may vary due to age or medical condition  Airway Management Planned: Nasal Cannula  Additional Equipment:   Intra-op Plan:   Post-operative Plan:   Informed Consent: I have reviewed the patients History and Physical, chart, labs and discussed the procedure including the risks, benefits and alternatives for the proposed anesthesia with the patient or authorized representative who has indicated his/her understanding and acceptance.     Dental advisory given  Plan Discussed with: CRNA and Anesthesiologist  Anesthesia Plan Comments:          Anesthesia Quick Evaluation

## 2020-07-28 NOTE — Op Note (Signed)
[facility] Patient Name: Jesse Nunez Procedure Date: 07/28/2020 MRN: [id number] Attending MD: Thornton Park MD, MD Date of Birth: [birth year] CSN: [id number] Age: 80 Admit Type: Inpatient Procedure:                Colonoscopy Indications:              Abdominal pain in the left lower quadrant, Weight                            loss                           Last colonoscopy 2016 at [facility] Providers:                Thornton Park MD, MD, Terrall Laity, Doristine Johns, RN, Benetta Spar, Technician Referring MD:              Medicines:                Monitored Anesthesia Care Complications:            No immediate complications. Estimated blood loss:                            Minimal. Estimated Blood Loss:     Estimated blood loss was minimal. Procedure:                Pre-Anesthesia Assessment:                           - Prior to the procedure, a History and Physical                            was performed, and patient medications and                            allergies were reviewed. The patient's tolerance of                            previous anesthesia was also reviewed. The risks                            and benefits of the procedure and the sedation                            options and risks were discussed with the patient.                            All questions were answered, and informed consent                            was obtained. Prior Anticoagulants: The patient has                            taken no previous anticoagulant or antiplatelet  agents. ASA Grade Assessment: III - A patient with                            severe systemic disease. After reviewing the risks                            and benefits, the patient was deemed in                            satisfactory condition to undergo the procedure.                           After obtaining informed consent, the colonoscope                             was passed under direct vision. Throughout the                            procedure, the patient's blood pressure, pulse, and                            oxygen saturations were monitored continuously. The                            PCF-H190DL ([id number] Olympus pediatric colonscope                            was introduced through the anus and advanced to the                            3 cm into the ileum. The colonoscopy was performed                            without difficulty. The patient tolerated the                            procedure well. The quality of the bowel                            preparation was good. The terminal ileum, ileocecal                            valve, appendiceal orifice, and rectum were                            photographed. Scope In: 11:30:39 AM Scope Out: 11:57:26 AM Scope Withdrawal Time: 0 hours 22 minutes 54 seconds  Total Procedure Duration: 0 hours 26 minutes 47 seconds  Findings:      Non-bleeding external and internal hemorrhoids were found.      Multiple small and large-mouthed diverticula were found in the sigmoid       colon, descending colon and distal transverse colon. The diverticulosis       is most severe in the sigmoid colon. There is no associated colitis.  Five sessile polyps were found in the descending colon, transverse       colon, ascending colon and cecum. The polyps were 1 to 3 mm in size.       These polyps were removed with a cold snare. Resection and retrieval       were complete. Estimated blood loss was minimal.      The exam was otherwise without abnormality on direct and retroflexion       views. Impression:               - Non-bleeding external and internal hemorrhoids.                           - Diverticulosis in the sigmoid colon, in the                            descending colon and in the distal transverse colon.                           - Five 1 to 3 mm polyps in the descending  colon, in                            the transverse colon, in the ascending colon and in                            the cecum, removed with a cold snare. Resected and                            retrieved.                           - The examination was otherwise normal on direct                            and retroflexion views.                           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                           - No source of weight loss identified on this study. Moderate Sedation:      Not Applicable - Patient had care per Anesthesia. Recommendation:           - Patient has a contact number available for                            emergencies. The signs and symptoms of potential                            delayed complications were discussed with the  patient. Return to normal activities tomorrow.                            Written discharge instructions were provided to the                            patient.                           - Continue present medications.                           - Await pathology results.                           - Follow a high fiber diet. Drink at least 64                            ounces of water daily. Add a daily stool bulking                            agent such as psyllium (an exampled would be                            Metamucil).                           - Emerging evidence supports eating a diet of                            fruits, vegetables, grains, calcium, and yogurt                            while reducing red meat and alcohol may reduce the                            risk of colon cancer.                           - No surveillance endoscopy recommended given his                            age. Procedure Code(s):        --- Professional ---                           [phone], Colonoscopy, flexible; with removal of                            tumor(s), polyp(s),  or other lesion(s) by snare                            technique Diagnosis Code(s):        --- Professional ---  K64.8, Other hemorrhoids                           K63.5, Polyp of colon                           R10.32, Left lower quadrant pain                           R63.4, Abnormal weight loss                           K57.30, Diverticulosis of large intestine without                            perforation or abscess without bleeding CPT copyright 2019 [employer]. All rights reserved. The codes documented in this report are preliminary and upon coder review may  be revised to meet current compliance requirements. Thornton Park MD, MD 07/28/2020 12:10:40 PM This report has been signed electronically. Number of Addenda: 0

## 2020-07-29 ENCOUNTER — Encounter (HOSPITAL_COMMUNITY): Payer: Self-pay | Admitting: Gastroenterology

## 2020-07-29 LAB — SURGICAL PATHOLOGY

## 2020-07-30 ENCOUNTER — Encounter: Payer: Self-pay | Admitting: Gastroenterology

## 2021-03-18 ENCOUNTER — Encounter (HOSPITAL_COMMUNITY): Payer: Self-pay

## 2021-03-18 ENCOUNTER — Inpatient Hospital Stay (HOSPITAL_COMMUNITY)
Admission: EM | Admit: 2021-03-18 | Discharge: 2021-03-23 | DRG: 064 | Disposition: A | Discharge status: Skilled Nursing Facility | Payer: Medicare Other | Attending: Internal Medicine | Admitting: Internal Medicine

## 2021-03-18 ENCOUNTER — Other Ambulatory Visit: Payer: Self-pay

## 2021-03-18 DIAGNOSIS — R338 Other retention of urine: Secondary | ICD-10-CM | POA: Diagnosis present

## 2021-03-18 DIAGNOSIS — R531 Weakness: Secondary | ICD-10-CM | POA: Diagnosis not present

## 2021-03-18 DIAGNOSIS — K219 Gastro-esophageal reflux disease without esophagitis: Secondary | ICD-10-CM | POA: Diagnosis present

## 2021-03-18 DIAGNOSIS — I6329 Cerebral infarction due to unspecified occlusion or stenosis of other precerebral arteries: Principal | ICD-10-CM | POA: Diagnosis present

## 2021-03-18 DIAGNOSIS — Z8371 Family history of colonic polyps: Secondary | ICD-10-CM

## 2021-03-18 DIAGNOSIS — Z7982 Long term (current) use of aspirin: Secondary | ICD-10-CM

## 2021-03-18 DIAGNOSIS — R9431 Abnormal electrocardiogram [ECG] [EKG]: Secondary | ICD-10-CM | POA: Diagnosis present

## 2021-03-18 DIAGNOSIS — E1165 Type 2 diabetes mellitus with hyperglycemia: Secondary | ICD-10-CM | POA: Diagnosis present

## 2021-03-18 DIAGNOSIS — N179 Acute kidney failure, unspecified: Secondary | ICD-10-CM | POA: Diagnosis present

## 2021-03-18 DIAGNOSIS — R112 Nausea with vomiting, unspecified: Secondary | ICD-10-CM | POA: Diagnosis present

## 2021-03-18 DIAGNOSIS — E1122 Type 2 diabetes mellitus with diabetic chronic kidney disease: Secondary | ICD-10-CM | POA: Diagnosis present

## 2021-03-18 DIAGNOSIS — R29708 NIHSS score 8: Secondary | ICD-10-CM | POA: Diagnosis not present

## 2021-03-18 DIAGNOSIS — J690 Pneumonitis due to inhalation of food and vomit: Secondary | ICD-10-CM | POA: Diagnosis present

## 2021-03-18 DIAGNOSIS — I7121 Aneurysm of the ascending aorta, without rupture: Secondary | ICD-10-CM | POA: Diagnosis present

## 2021-03-18 DIAGNOSIS — E872 Acidosis, unspecified: Secondary | ICD-10-CM | POA: Diagnosis present

## 2021-03-18 DIAGNOSIS — R197 Diarrhea, unspecified: Secondary | ICD-10-CM | POA: Diagnosis present

## 2021-03-18 DIAGNOSIS — K529 Noninfective gastroenteritis and colitis, unspecified: Secondary | ICD-10-CM | POA: Diagnosis present

## 2021-03-18 DIAGNOSIS — R339 Retention of urine, unspecified: Secondary | ICD-10-CM | POA: Diagnosis present

## 2021-03-18 DIAGNOSIS — Z8673 Personal history of transient ischemic attack (TIA), and cerebral infarction without residual deficits: Secondary | ICD-10-CM

## 2021-03-18 DIAGNOSIS — R29709 NIHSS score 9: Secondary | ICD-10-CM | POA: Diagnosis not present

## 2021-03-18 DIAGNOSIS — J189 Pneumonia, unspecified organism: Secondary | ICD-10-CM | POA: Diagnosis present

## 2021-03-18 DIAGNOSIS — A419 Sepsis, unspecified organism: Secondary | ICD-10-CM

## 2021-03-18 DIAGNOSIS — K2970 Gastritis, unspecified, without bleeding: Secondary | ICD-10-CM | POA: Diagnosis present

## 2021-03-18 DIAGNOSIS — R29711 NIHSS score 11: Secondary | ICD-10-CM | POA: Diagnosis not present

## 2021-03-18 DIAGNOSIS — Z8249 Family history of ischemic heart disease and other diseases of the circulatory system: Secondary | ICD-10-CM

## 2021-03-18 DIAGNOSIS — Z20822 Contact with and (suspected) exposure to covid-19: Secondary | ICD-10-CM | POA: Diagnosis present

## 2021-03-18 DIAGNOSIS — R29810 Facial weakness: Secondary | ICD-10-CM | POA: Diagnosis not present

## 2021-03-18 DIAGNOSIS — N1831 Chronic kidney disease, stage 3a: Secondary | ICD-10-CM | POA: Diagnosis present

## 2021-03-18 DIAGNOSIS — E559 Vitamin D deficiency, unspecified: Secondary | ICD-10-CM | POA: Diagnosis present

## 2021-03-18 DIAGNOSIS — G4733 Obstructive sleep apnea (adult) (pediatric): Secondary | ICD-10-CM | POA: Diagnosis present

## 2021-03-18 DIAGNOSIS — Z87891 Personal history of nicotine dependence: Secondary | ICD-10-CM

## 2021-03-18 DIAGNOSIS — G8191 Hemiplegia, unspecified affecting right dominant side: Secondary | ICD-10-CM | POA: Diagnosis not present

## 2021-03-18 DIAGNOSIS — Z86010 Personal history of colonic polyps: Secondary | ICD-10-CM

## 2021-03-18 DIAGNOSIS — I129 Hypertensive chronic kidney disease with stage 1 through stage 4 chronic kidney disease, or unspecified chronic kidney disease: Secondary | ICD-10-CM | POA: Diagnosis present

## 2021-03-18 DIAGNOSIS — Z8619 Personal history of other infectious and parasitic diseases: Secondary | ICD-10-CM

## 2021-03-18 DIAGNOSIS — I639 Cerebral infarction, unspecified: Secondary | ICD-10-CM | POA: Diagnosis present

## 2021-03-18 DIAGNOSIS — N183 Chronic kidney disease, stage 3 unspecified: Secondary | ICD-10-CM | POA: Diagnosis present

## 2021-03-18 DIAGNOSIS — F0390 Unspecified dementia without behavioral disturbance: Secondary | ICD-10-CM | POA: Diagnosis present

## 2021-03-18 DIAGNOSIS — R29712 NIHSS score 12: Secondary | ICD-10-CM | POA: Diagnosis not present

## 2021-03-18 DIAGNOSIS — Z79899 Other long term (current) drug therapy: Secondary | ICD-10-CM

## 2021-03-18 DIAGNOSIS — R471 Dysarthria and anarthria: Secondary | ICD-10-CM | POA: Diagnosis not present

## 2021-03-18 DIAGNOSIS — R270 Ataxia, unspecified: Secondary | ICD-10-CM | POA: Diagnosis present

## 2021-03-18 DIAGNOSIS — Z888 Allergy status to other drugs, medicaments and biological substances status: Secondary | ICD-10-CM

## 2021-03-18 DIAGNOSIS — I719 Aortic aneurysm of unspecified site, without rupture: Secondary | ICD-10-CM | POA: Diagnosis present

## 2021-03-18 DIAGNOSIS — Z66 Do not resuscitate: Secondary | ICD-10-CM | POA: Diagnosis present

## 2021-03-18 DIAGNOSIS — G9341 Metabolic encephalopathy: Secondary | ICD-10-CM | POA: Diagnosis present

## 2021-03-18 DIAGNOSIS — H3530 Unspecified macular degeneration: Secondary | ICD-10-CM | POA: Diagnosis present

## 2021-03-18 DIAGNOSIS — R29707 NIHSS score 7: Secondary | ICD-10-CM | POA: Diagnosis not present

## 2021-03-18 DIAGNOSIS — J439 Emphysema, unspecified: Secondary | ICD-10-CM | POA: Diagnosis present

## 2021-03-18 DIAGNOSIS — E860 Dehydration: Secondary | ICD-10-CM | POA: Diagnosis present

## 2021-03-18 DIAGNOSIS — I10 Essential (primary) hypertension: Secondary | ICD-10-CM | POA: Diagnosis present

## 2021-03-18 DIAGNOSIS — E785 Hyperlipidemia, unspecified: Secondary | ICD-10-CM | POA: Diagnosis present

## 2021-03-18 DIAGNOSIS — K5909 Other constipation: Secondary | ICD-10-CM | POA: Diagnosis present

## 2021-03-18 LAB — CBC WITH DIFFERENTIAL/PLATELET
Abs Immature Granulocytes: 0.03 10*3/uL (ref 0.00–0.07)
Basophils Absolute: 0 10*3/uL (ref 0.0–0.1)
Basophils Relative: 0 %
Eosinophils Absolute: 0.1 10*3/uL (ref 0.0–0.5)
Eosinophils Relative: 1 %
HCT: 42.6 % (ref 39.0–52.0)
Hemoglobin: 13.9 g/dL (ref 13.0–17.0)
Immature Granulocytes: 0 %
Lymphocytes Relative: 17 %
Lymphs Abs: 1.5 10*3/uL (ref 0.7–4.0)
MCH: 29.3 pg (ref 26.0–34.0)
MCHC: 32.6 g/dL (ref 30.0–36.0)
MCV: 89.7 fL (ref 80.0–100.0)
Monocytes Absolute: 0.5 10*3/uL (ref 0.1–1.0)
Monocytes Relative: 6 %
Neutro Abs: 6.7 10*3/uL (ref 1.7–7.7)
Neutrophils Relative %: 76 %
Platelets: 232 10*3/uL (ref 150–400)
RBC: 4.75 MIL/uL (ref 4.22–5.81)
RDW: 13.1 % (ref 11.5–15.5)
WBC: 8.7 10*3/uL (ref 4.0–10.5)
nRBC: 0 % (ref 0.0–0.2)

## 2021-03-18 LAB — I-STAT CHEM 8, ED
BUN: 30 mg/dL — ABNORMAL HIGH (ref 8–23)
Calcium, Ion: 1.18 mmol/L (ref 1.15–1.40)
Chloride: 105 mmol/L (ref 98–111)
Creatinine, Ser: 1.5 mg/dL — ABNORMAL HIGH (ref 0.61–1.24)
Glucose, Bld: 237 mg/dL — ABNORMAL HIGH (ref 70–99)
HCT: 42 % (ref 39.0–52.0)
Hemoglobin: 14.3 g/dL (ref 13.0–17.0)
Potassium: 4.3 mmol/L (ref 3.5–5.1)
Sodium: 140 mmol/L (ref 135–145)
TCO2: 24 mmol/L (ref 22–32)

## 2021-03-18 LAB — COMPREHENSIVE METABOLIC PANEL
ALT: 25 U/L (ref 0–44)
AST: 21 U/L (ref 15–41)
Albumin: 3.8 g/dL (ref 3.5–5.0)
Alkaline Phosphatase: 61 U/L (ref 38–126)
Anion gap: 12 (ref 5–15)
BUN: 31 mg/dL — ABNORMAL HIGH (ref 8–23)
CO2: 23 mmol/L (ref 22–32)
Calcium: 9.6 mg/dL (ref 8.9–10.3)
Chloride: 103 mmol/L (ref 98–111)
Creatinine, Ser: 1.53 mg/dL — ABNORMAL HIGH (ref 0.61–1.24)
GFR, Estimated: 46 mL/min — ABNORMAL LOW (ref >60)
Glucose, Bld: 231 mg/dL — ABNORMAL HIGH (ref 70–99)
Potassium: 4.3 mmol/L (ref 3.5–5.1)
Sodium: 138 mmol/L (ref 135–145)
Total Bilirubin: 0.7 mg/dL (ref 0.3–1.2)
Total Protein: 7.9 g/dL (ref 6.5–8.1)

## 2021-03-18 LAB — LACTIC ACID, PLASMA: Lactic Acid, Venous: 2 mmol/L (ref 0.5–1.9)

## 2021-03-18 MED ORDER — ONDANSETRON HCL 4 MG/2ML IJ SOLN
4.0000 mg | Freq: Once | INTRAMUSCULAR | Status: AC
  Administered 2021-03-18: 4 mg via INTRAVENOUS
  Filled 2021-03-18: qty 2

## 2021-03-18 MED ORDER — SODIUM CHLORIDE 0.9 % IV BOLUS
1000.0000 mL | Freq: Once | INTRAVENOUS | Status: AC
Start: 2021-03-18 — End: 2021-03-19
  Administered 2021-03-18: 1000 mL via INTRAVENOUS

## 2021-03-18 NOTE — ED Provider Notes (Signed)
[facility] DEPT Provider Note  CSN: [id number] Arrival date & time: 03/18/21 2157  Chief Complaint(s) Weakness  HPI Jesse Nunez is a 81 y.o. male with a past medical history listed below including hypertension, hyperlipidemia, diabetes, dementia here for several days of nausea, nonbloody nonbilious emesis and diarrhea.  Patient's nephew believes patient is dehydrated due to all the GI losses.  Has had minimal p.o. intake over the last day.  Has been more lethargic than normal.  Patient lives at independent living facility and has a come every day.  Of note patient was recently treated for right hand cellulitis versus gout infection earlier in the week.  He has been unable to keep the antibiotics down.   No known fevers.  Patient started complaining of abdominal pain today.  No chest pain.  No shortness of breath.  No cough or congestion.  The history is provided by a relative and the patient.   Past Medical History Past Medical History:  Diagnosis Date   COPD (chronic obstructive pulmonary disease) ([place])    Depression    Diabetes ([place])    Family history of colon cancer    Hyperlipidemia    Hypertension    Macular degeneration disease    Shingles    Sleep apnea    Uses CPAP sometimes   Patient Active Problem List   Diagnosis Date Noted   Adenomatous polyp of ascending colon    Family history of colon cancer 06/24/2020   LLQ abdominal pain 06/24/2020   Loss of weight 06/24/2020   Other constipation 06/24/2020   Prolonged QT interval 04/12/2020   Generalized weakness 04/11/2020   Failure to thrive in adult 04/11/2020   Hypokalemia 04/11/2020   Dehydration 04/11/2020   Fall at home, initial encounter 04/11/2020   Shingles 04/11/2020   Subarachnoid hemorrhage ([place]) 07/03/2013   Depression 07/03/2013   COPD exacerbation ([place]) 07/03/2013   Syncope 07/02/2013   Home Medication(s) Prior to Admission medications   Medication Sig Start Date End  Date Taking? Authorizing Provider  albuterol (PROVENTIL HFA;VENTOLIN HFA) 108 (90 BASE) MCG/ACT inhaler Inhale 2 puffs into the lungs every 6 (six) hours as needed for wheezing or shortness of breath. 07/06/13   Kinnie Feil, MD  aspirin EC 81 MG tablet Take 81 mg by mouth daily. Swallow whole.    [provider]  atorvastatin (LIPITOR) 80 MG tablet Take 80 mg by mouth at bedtime. 03/21/17   [provider]  colestipol (COLESTID) 1 g tablet Take 1 g by mouth daily.    [provider]  ferrous sulfate 325 (65 FE) MG EC tablet Take 1 tablet (325 mg total) by mouth daily with breakfast. 07/06/13   Buriev, Arie Sabina, MD  fluticasone (FLONASE) 50 MCG/ACT nasal spray Place 2 sprays into both nostrils daily.    [provider]  lisinopril (ZESTRIL) 20 MG tablet Take 10 tablets by mouth daily. 03/21/17   [provider]  Multiple Vitamin (MULTIVITAMIN WITH MINERALS) TABS tablet Take 1 tablet by mouth daily.    [provider]  NUTRITIONAL SUPPLEMENTS PO Take 237 mLs by mouth daily as needed (sometimes).    [provider]  omeprazole (PRILOSEC) 20 MG capsule Take 40 mg by mouth daily. 03/21/17   [provider]  tamsulosin (FLOMAX) 0.4 MG CAPS capsule Take 0.4 mg by mouth at bedtime. 03/21/17   [provider]  zolpidem (AMBIEN) 5 MG tablet Take 1 tablet (5 mg total) by mouth at bedtime. 04/14/20  Alma Friendly, MD                                                                                                                                    Allergies Glipizide  Review of Systems Review of Systems As noted in HPI  Physical Exam Vital Signs  I have reviewed the triage vital signs BP 134/78    Pulse 69    Temp 98 F (36.7 C) (Oral)    Resp 13    Ht '5\' 11"'$  (1.803 m)    Wt 72.6 kg    SpO2 94%    BMI 22.32 kg/m   Physical Exam Vitals reviewed.  Constitutional:      General: He is not in acute distress.     Appearance: He is well-developed. He is not diaphoretic.  HENT:     Head: Normocephalic and atraumatic.     Nose: Nose normal.  Eyes:     General: No scleral icterus.       Right eye: No discharge.        Left eye: No discharge.     Conjunctiva/sclera: Conjunctivae normal.     Pupils: Pupils are equal, round, and reactive to light.  Cardiovascular:     Rate and Rhythm: Normal rate and regular rhythm.     Heart sounds: No murmur heard.   No friction rub. No gallop.  Pulmonary:     Effort: Pulmonary effort is normal. No respiratory distress.     Breath sounds: Normal breath sounds. No stridor. No rales.  Abdominal:     General: There is no distension.     Palpations: Abdomen is soft.     Tenderness: There is abdominal tenderness in the left lower quadrant.  Musculoskeletal:        General: No tenderness.     Cervical back: Normal range of motion and neck supple.  Skin:    General: Skin is warm and dry.     Findings: No erythema or rash.  Neurological:     Mental Status: He is alert.     Comments: Follows commands, but slowed response.    ED Results and Treatments Labs (all labs ordered are listed, but only abnormal results are displayed) Labs Reviewed  COMPREHENSIVE METABOLIC PANEL - Abnormal; Notable for the following components:      Result Value   Glucose, Bld 231 (*)    BUN 31 (*)    Creatinine, Ser 1.53 (*)    GFR, Estimated 46 (*)    All other components within normal limits  URINALYSIS, ROUTINE W REFLEX MICROSCOPIC - Abnormal; Notable for the following components:   Protein, ur 100 (*)    Bacteria, UA RARE (*)    All other components within normal limits  LACTIC ACID, PLASMA - Abnormal; Notable for the following components:   Lactic Acid, Venous 2.0 (*)    All other components within normal limits  LACTIC ACID, PLASMA - Abnormal; Notable for the following components:   Lactic Acid, Venous 2.3 (*)    All other components within normal limits  I-STAT CHEM 8, ED  - Abnormal; Notable for the following components:   BUN 30 (*)    Creatinine, Ser 1.50 (*)    Glucose, Bld 237 (*)    All other components within normal limits  URINE CULTURE  CULTURE, BLOOD (ROUTINE X 2)  CULTURE, BLOOD (ROUTINE X 2)  CBC WITH DIFFERENTIAL/PLATELET                                                                                                                         EKG  EKG Interpretation  Date/Time:  Wednesday March 18 2021 22:15:49 EST Ventricular Rate:  65 PR Interval:  169 QRS Duration: 95 QT Interval:  454 QTC Calculation: 473 R Axis:   20 Text Interpretation: Sinus or ectopic atrial rhythm Abnormal R-wave progression, early transition Nonspecific ST depression Confirmed by Addison Lank [phone]) on 03/18/2021 11:16:27 PM       Radiology CT Head Wo Contrast  Result Date: 03/19/2021 CLINICAL DATA:  Neuro deficit, acute, stroke suspected Dementia patient with weakness, nausea and vomiting. EXAM: CT HEAD WITHOUT CONTRAST TECHNIQUE: Contiguous axial images were obtained from the base of the skull through the vertex without intravenous contrast. RADIATION DOSE REDUCTION: This exam was performed according to the departmental dose-optimization program which includes automated exposure control, adjustment of the mA and/or kV according to patient size and/or use of iterative reconstruction technique. COMPARISON:  Head CT and brain MRI 04/11/2020 FINDINGS: Brain: No acute intracranial hemorrhage. Similar degree of atrophy and chronic small vessel ischemic change. There are remote lacunar infarcts within the bilateral basal ganglia and thalami. Normal bilateral lacunar cerebellar infarcts. Unchanged left occipital infarct from prior exam. There is no CT evidence of acute ischemia. No hydrocephalus. No subdural or extra-axial collection. Vascular: Atherosclerosis of skullbase vasculature without hyperdense vessel or abnormal calcification. Skull: No fracture or focal lesion.  Sinuses/Orbits: Mucous retention cysts in both maxillary sinuses. Occasional opacification of ethmoid air cells. No mastoid effusion. Other: None. IMPRESSION: 1. No acute intracranial abnormality. 2. Unchanged atrophy, chronic small vessel ischemic change, and remote infarcts since April of 2022. Electronically Signed   By: Keith Rake M.D.   On: 03/19/2021 00:30   CT ABDOMEN PELVIS W CONTRAST  Result Date: 03/19/2021 CLINICAL DATA:  Nausea, vomiting and weakness. EXAM: CT ABDOMEN AND PELVIS WITH CONTRAST TECHNIQUE: Multidetector CT imaging of the abdomen and pelvis was performed using the standard protocol following bolus administration of intravenous contrast. RADIATION DOSE REDUCTION: This exam was performed according to the departmental dose-optimization program which includes automated exposure control, adjustment of the mA and/or kV according to patient size and/or use of iterative reconstruction technique. CONTRAST:  140m OMNIPAQUE IOHEXOL 300 MG/ML  SOLN COMPARISON:  CT with IV and oral contrast 05/08/2020, chest CT no contrast 07/04/2013. FINDINGS: Lower chest: The cardiac size is normal. There  calcifications in the aortic valve annulus, circumflex and right coronary arteries. Small hiatal hernia. Minimal left pleural effusion. There is increased airspace consolidation in the basal segments of both lower lobes, denser on the left and most likely due to pneumonia or aspiration. More anteriorly the remaining lung bases are clear. Factors affecting quality in the abdomen: Streak artifact from the patient's arms, and artifact due to respiratory motion. Hepatobiliary: 18.5 cm length mildly steatotic liver. No mass enhancement. 1.5 cm cyst in the dome of the right lobe in the anterior segment is again shown. Gallbladder contains at least 1 small stone, possibly two but there is no wall thickening or biliary dilatation. No portal vein dilatation. Pancreas: Unremarkable. Spleen: Normal in size. Few tiny  hypodensities are again noted but unchanged. No splenomegaly. Adrenals/Urinary Tract: There is no adrenal mass , no renal cortical mass , no evidence of urinary stones or obstruction. There are renovascular calcifications at the renal hila. Moderate bladder distention is seen with the dome reaching the level of L4-5. There is no wall thickening. Stomach/Bowel: Stomach is distended with fluid and there is fold thickening without inflammatory change. The unopacified small bowel is unremarkable. The appendix again noted surgically absent. There is colonic diverticulosis greatest along the left colon without evidence of acute diverticulitis or wall thickening. Vascular/Lymphatic: Heavy aortoiliac atherosclerosis. No AAA. No adenopathy. Reproductive: No prostatomegaly. Other: Small umbilical and inguinal fat hernias. There is no free air, hemorrhage or fluid. Musculoskeletal: There are chronic compression fractures, mild at T12, L2, and more prominently at L3 along the lower plate where there is posterior retropulsion moderately narrowing the spinal canal. This was seen previously and unchanged. L3 vertebral height loss remains at 50%. There is osteopenia and degenerative change. There is unilateral ankylosis across the left SI joint. IMPRESSION: 1. Bilateral lower lobe opacities, denser on the left consistent with multilobar pneumonia or aspiration. Minimal left pleural effusion. Follow-up CT recommended after treatment to ensure clearing. 2. No acute abnormality demonstrated in the abdomen or pelvis. 3. Cholelithiasis. 4. Aortic atherosclerosis. 5. Bladder distention without wall thickening. No significant prostate enlargement. 6. Advanced diverticulosis without evidence of diverticulitis. 7. Fold thickening in the stomach consistent with gastritis, and gastric fluid distention which could be due to recent ingestion or impaired gastric emptying. 8. Remaining findings described above including chronic L3 moderate  compression fracture from the lower plate with stenosing retropulsion. Electronically Signed   By: Telford Nab M.D.   On: 03/19/2021 00:51    Pertinent labs & imaging results that were available during my care of the patient were reviewed by me and considered in my medical decision making (see MDM for details).  Medications Ordered in ED Medications  piperacillin-tazobactam (ZOSYN) IVPB 3.375 g (has no administration in time range)  vancomycin (VANCOREADY) IVPB 1500 mg/300 mL (has no administration in time range)  sodium chloride 0.9 % bolus 1,000 mL (has no administration in time range)    Followed by  0.9 %  sodium chloride infusion (has no administration in time range)  sodium chloride 0.9 % bolus 1,000 mL (1,000 mLs Intravenous New Bag/Given 03/18/21 2326)  ondansetron (ZOFRAN) injection 4 mg (4 mg Intravenous Given 03/18/21 2326)  iohexol (OMNIPAQUE) 300 MG/ML solution 100 mL (100 mLs Intravenous Contrast Given 03/19/21 0006)  Procedures .Critical Care Performed by: Fatima Blank, MD Authorized by: Fatima Blank, MD   Critical care provider statement:    Critical care time (minutes):  45   Critical care time was exclusive of:  Separately billable procedures and treating other patients   Critical care was necessary to treat or prevent imminent or life-threatening deterioration of the following conditions:  Sepsis   Critical care was time spent personally by me on the following activities:  Development of treatment plan with patient or surrogate, discussions with consultants, evaluation of patient's response to treatment, examination of patient, obtaining history from patient or surrogate, review of old charts, re-evaluation of patient's condition, pulse oximetry, ordering and review of radiographic studies, ordering and review of laboratory  studies and ordering and performing treatments and interventions   Care discussed with: admitting provider    (including critical care time)  Medical Decision Making / ED Course    Complexity of Problem:  Co-morbidities/SDOH that complicate the patient evaluation/care: Listed above  Additional history obtained: nephew  Patient's presenting problem/concern and DDX listed below: Fatigue in setting of GI sx Will assess for electrolyte derangements or renal sufficiency.  Etiology of symptoms possibly viral gastroenteritis.  Will assess for pancreatitis, biliary disease, colitis/diverticulitis. Assess for DKA. 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  Will get CT to rule out ICH or new large territory infarct     Complexity of Data:   Cardiac Monitoring: The patient was maintained on a cardiac monitor.   I personally viewed and interpreted the cardiac monitored which showed an underlying rhythm of NS at 78.  No dysrhythmias or blocks  Laboratory Tests ordered listed below with my independent interpretation: CBC without leukocytosis or anemia No significant electrolyte derangement. Stable renal function.  Hyperglycemia without DKA. No evidence of bili obstruction or pancreatitis. Lactic acid is slightly elevated at 2.0 but likely from dehydration, but still working up for bacterial infectious source. UA without evidence of infection.   Imaging Studies ordered listed below with my independent interpretation: CT w/o ICH or acute changes CT abd/pelvis w/o evidence of serious intra-abdominal inflammatory/infectious process.  Bibasilar lung infiltrates noted.  Possible pneumonia.  Distended bladder.     ED Course:    Hospitalization Considered:  yes  Assessment, Intervention, and Reassessment: N/V/D with fatigue Provided with IV fluids and antiemetics Likely viral  process  Bibasilar infiltrates. Code sepsis initiated and patient started on empiric antibiotics for aspiration pneumonia. Requires admission. Urinary retention Foley inserted UA without evidence of infection.  Final Clinical Impression(s) / ED Diagnoses Final diagnoses:  Aspiration pneumonia of both lower lobes due to gastric secretions (HCC)  Nausea vomiting and diarrhea  Sepsis without acute organ dysfunction, due to unspecified organism [facility])  Urine retention           This chart was dictated using voice recognition software.  Despite best efforts to proofread,  errors can occur which can change the documentation meaning.    Fatima Blank, MD 03/19/21 [id number]

## 2021-03-18 NOTE — ED Triage Notes (Addendum)
BIB EMS from [place] brought in for Weakness, N/V X 2 days, RH cellulitis and gout on Doxy noncompliant, DB, Dementia ?

## 2021-03-19 ENCOUNTER — Emergency Department (HOSPITAL_COMMUNITY): Payer: Medicare Other

## 2021-03-19 ENCOUNTER — Encounter (HOSPITAL_COMMUNITY): Payer: Self-pay | Admitting: Family Medicine

## 2021-03-19 DIAGNOSIS — R29708 NIHSS score 8: Secondary | ICD-10-CM | POA: Diagnosis not present

## 2021-03-19 DIAGNOSIS — F0390 Unspecified dementia without behavioral disturbance: Secondary | ICD-10-CM | POA: Diagnosis present

## 2021-03-19 DIAGNOSIS — K2970 Gastritis, unspecified, without bleeding: Secondary | ICD-10-CM | POA: Diagnosis not present

## 2021-03-19 DIAGNOSIS — J690 Pneumonitis due to inhalation of food and vomit: Secondary | ICD-10-CM | POA: Diagnosis present

## 2021-03-19 DIAGNOSIS — I7121 Aneurysm of the ascending aorta, without rupture: Secondary | ICD-10-CM | POA: Diagnosis present

## 2021-03-19 DIAGNOSIS — E872 Acidosis, unspecified: Secondary | ICD-10-CM | POA: Diagnosis present

## 2021-03-19 DIAGNOSIS — R29707 NIHSS score 7: Secondary | ICD-10-CM | POA: Diagnosis not present

## 2021-03-19 DIAGNOSIS — J189 Pneumonia, unspecified organism: Secondary | ICD-10-CM | POA: Diagnosis present

## 2021-03-19 DIAGNOSIS — E860 Dehydration: Secondary | ICD-10-CM | POA: Diagnosis present

## 2021-03-19 DIAGNOSIS — Z8673 Personal history of transient ischemic attack (TIA), and cerebral infarction without residual deficits: Secondary | ICD-10-CM | POA: Diagnosis not present

## 2021-03-19 DIAGNOSIS — R112 Nausea with vomiting, unspecified: Secondary | ICD-10-CM | POA: Diagnosis present

## 2021-03-19 DIAGNOSIS — I6329 Cerebral infarction due to unspecified occlusion or stenosis of other precerebral arteries: Secondary | ICD-10-CM | POA: Diagnosis present

## 2021-03-19 DIAGNOSIS — I129 Hypertensive chronic kidney disease with stage 1 through stage 4 chronic kidney disease, or unspecified chronic kidney disease: Secondary | ICD-10-CM | POA: Diagnosis present

## 2021-03-19 DIAGNOSIS — K529 Noninfective gastroenteritis and colitis, unspecified: Secondary | ICD-10-CM | POA: Diagnosis present

## 2021-03-19 DIAGNOSIS — I639 Cerebral infarction, unspecified: Secondary | ICD-10-CM | POA: Diagnosis not present

## 2021-03-19 DIAGNOSIS — N179 Acute kidney failure, unspecified: Secondary | ICD-10-CM | POA: Diagnosis present

## 2021-03-19 DIAGNOSIS — R29712 NIHSS score 12: Secondary | ICD-10-CM | POA: Diagnosis not present

## 2021-03-19 DIAGNOSIS — K449 Diaphragmatic hernia without obstruction or gangrene: Secondary | ICD-10-CM | POA: Diagnosis not present

## 2021-03-19 DIAGNOSIS — E1165 Type 2 diabetes mellitus with hyperglycemia: Secondary | ICD-10-CM | POA: Diagnosis present

## 2021-03-19 DIAGNOSIS — N1831 Chronic kidney disease, stage 3a: Secondary | ICD-10-CM | POA: Diagnosis present

## 2021-03-19 DIAGNOSIS — Z20822 Contact with and (suspected) exposure to covid-19: Secondary | ICD-10-CM | POA: Diagnosis present

## 2021-03-19 DIAGNOSIS — Z66 Do not resuscitate: Secondary | ICD-10-CM | POA: Diagnosis present

## 2021-03-19 DIAGNOSIS — I6389 Other cerebral infarction: Secondary | ICD-10-CM | POA: Diagnosis not present

## 2021-03-19 DIAGNOSIS — N183 Chronic kidney disease, stage 3 unspecified: Secondary | ICD-10-CM | POA: Diagnosis present

## 2021-03-19 DIAGNOSIS — R29711 NIHSS score 11: Secondary | ICD-10-CM | POA: Diagnosis not present

## 2021-03-19 DIAGNOSIS — E1122 Type 2 diabetes mellitus with diabetic chronic kidney disease: Secondary | ICD-10-CM | POA: Diagnosis present

## 2021-03-19 DIAGNOSIS — J439 Emphysema, unspecified: Secondary | ICD-10-CM | POA: Diagnosis present

## 2021-03-19 DIAGNOSIS — G8191 Hemiplegia, unspecified affecting right dominant side: Secondary | ICD-10-CM | POA: Diagnosis not present

## 2021-03-19 DIAGNOSIS — G9341 Metabolic encephalopathy: Secondary | ICD-10-CM | POA: Diagnosis present

## 2021-03-19 DIAGNOSIS — R531 Weakness: Secondary | ICD-10-CM | POA: Diagnosis present

## 2021-03-19 DIAGNOSIS — R197 Diarrhea, unspecified: Secondary | ICD-10-CM | POA: Diagnosis present

## 2021-03-19 DIAGNOSIS — R29709 NIHSS score 9: Secondary | ICD-10-CM | POA: Diagnosis not present

## 2021-03-19 LAB — BASIC METABOLIC PANEL
Anion gap: 11 (ref 5–15)
BUN: 28 mg/dL — ABNORMAL HIGH (ref 8–23)
CO2: 18 mmol/L — ABNORMAL LOW (ref 22–32)
Calcium: 8.7 mg/dL — ABNORMAL LOW (ref 8.9–10.3)
Chloride: 110 mmol/L (ref 98–111)
Creatinine, Ser: 1.52 mg/dL — ABNORMAL HIGH (ref 0.61–1.24)
GFR, Estimated: 46 mL/min — ABNORMAL LOW (ref >60)
Glucose, Bld: 172 mg/dL — ABNORMAL HIGH (ref 70–99)
Potassium: 4.8 mmol/L (ref 3.5–5.1)
Sodium: 139 mmol/L (ref 135–145)

## 2021-03-19 LAB — BLOOD GAS, ARTERIAL
Acid-base deficit: 2.6 mmol/L — ABNORMAL HIGH (ref 0.0–2.0)
Bicarbonate: 21.8 mmol/L (ref 20.0–28.0)
Drawn by: 331471
FIO2: 21 %
O2 Saturation: 95.9 %
Patient temperature: 37
pCO2 arterial: 36 mmHg (ref 32–48)
pH, Arterial: 7.39 (ref 7.35–7.45)
pO2, Arterial: 77 mmHg — ABNORMAL LOW (ref 83–108)

## 2021-03-19 LAB — URINALYSIS, ROUTINE W REFLEX MICROSCOPIC
Bilirubin Urine: NEGATIVE
Glucose, UA: NEGATIVE mg/dL
Hgb urine dipstick: NEGATIVE
Ketones, ur: NEGATIVE mg/dL
Leukocytes,Ua: NEGATIVE
Nitrite: NEGATIVE
Protein, ur: 100 mg/dL — AB
Specific Gravity, Urine: 1.02 (ref 1.005–1.030)
pH: 5 (ref 5.0–8.0)

## 2021-03-19 LAB — CBC
HCT: 37.8 % — ABNORMAL LOW (ref 39.0–52.0)
Hemoglobin: 12.4 g/dL — ABNORMAL LOW (ref 13.0–17.0)
MCH: 29.5 pg (ref 26.0–34.0)
MCHC: 32.8 g/dL (ref 30.0–36.0)
MCV: 89.8 fL (ref 80.0–100.0)
Platelets: 183 10*3/uL (ref 150–400)
RBC: 4.21 MIL/uL — ABNORMAL LOW (ref 4.22–5.81)
RDW: 13.2 % (ref 11.5–15.5)
WBC: 11.6 10*3/uL — ABNORMAL HIGH (ref 4.0–10.5)
nRBC: 0 % (ref 0.0–0.2)

## 2021-03-19 LAB — RESP PANEL BY RT-PCR (FLU A&B, COVID) ARPGX2
Influenza A by PCR: NEGATIVE
Influenza B by PCR: NEGATIVE
SARS Coronavirus 2 by RT PCR: NEGATIVE

## 2021-03-19 LAB — VITAMIN B12: Vitamin B-12: 273 pg/mL (ref 180–914)

## 2021-03-19 LAB — TSH: TSH: 0.757 u[IU]/mL (ref 0.350–4.500)

## 2021-03-19 LAB — GLUCOSE, CAPILLARY
Glucose-Capillary: 114 mg/dL — ABNORMAL HIGH (ref 70–99)
Glucose-Capillary: 122 mg/dL — ABNORMAL HIGH (ref 70–99)
Glucose-Capillary: 124 mg/dL — ABNORMAL HIGH (ref 70–99)
Glucose-Capillary: 125 mg/dL — ABNORMAL HIGH (ref 70–99)
Glucose-Capillary: 162 mg/dL — ABNORMAL HIGH (ref 70–99)

## 2021-03-19 LAB — AMMONIA: Ammonia: 12 umol/L (ref 9–35)

## 2021-03-19 LAB — LACTIC ACID, PLASMA: Lactic Acid, Venous: 2.3 mmol/L (ref 0.5–1.9)

## 2021-03-19 LAB — HEMOGLOBIN A1C
Hgb A1c MFr Bld: 6.6 % — ABNORMAL HIGH (ref 4.8–5.6)
Mean Plasma Glucose: 142.72 mg/dL

## 2021-03-19 LAB — VITAMIN D 25 HYDROXY (VIT D DEFICIENCY, FRACTURES): Vit D, 25-Hydroxy: 23.46 ng/mL — ABNORMAL LOW (ref 30–100)

## 2021-03-19 MED ORDER — SODIUM CHLORIDE 0.9 % IV SOLN: 3.0000 g | Freq: Four times a day (QID) | INTRAVENOUS | Status: DC

## 2021-03-19 MED ORDER — ENOXAPARIN SODIUM 40 MG/0.4ML IJ SOSY
40.0000 mg | PREFILLED_SYRINGE | INTRAMUSCULAR | Status: DC
  Administered 2021-03-19: 09:00:00 40 mg via SUBCUTANEOUS
  Filled 2021-03-19: qty 0.4

## 2021-03-19 MED ORDER — ACETAMINOPHEN 325 MG PO TABS
650.0000 mg | ORAL_TABLET | Freq: Four times a day (QID) | ORAL | Status: DC | PRN
  Administered 2021-03-20 – 2021-03-23 (×2): 650 mg via ORAL
  Filled 2021-03-19 (×2): qty 2

## 2021-03-19 MED ORDER — ACETAMINOPHEN 650 MG RE SUPP: 650.0000 mg | Freq: Four times a day (QID) | RECTAL | Status: DC | PRN

## 2021-03-19 MED ORDER — THIAMINE HCL 100 MG/ML IJ SOLN
500.0000 mg | Freq: Every day | INTRAVENOUS | Status: AC
  Administered 2021-03-19 – 2021-03-21 (×3): 500 mg via INTRAVENOUS
  Filled 2021-03-19 (×3): qty 5

## 2021-03-19 MED ORDER — SODIUM CHLORIDE 0.9 % IV SOLN: 1000.0000 mL | INTRAVENOUS | Status: DC

## 2021-03-19 MED ORDER — INSULIN ASPART 100 UNIT/ML IJ SOLN: 0.0000 [IU] | Freq: Four times a day (QID) | INTRAMUSCULAR | Status: DC | PRN

## 2021-03-19 MED ORDER — IPRATROPIUM-ALBUTEROL 0.5-2.5 (3) MG/3ML IN SOLN: 3.0000 mL | Freq: Four times a day (QID) | RESPIRATORY_TRACT | Status: DC | PRN

## 2021-03-19 MED ORDER — LACTATED RINGERS IV SOLN: INTRAVENOUS | Status: DC

## 2021-03-19 MED ORDER — SODIUM CHLORIDE 0.9 % IV SOLN
3.0000 g | Freq: Three times a day (TID) | INTRAVENOUS | Status: DC
  Administered 2021-03-19 – 2021-03-23 (×13): 3 g via INTRAVENOUS
  Filled 2021-03-19 (×17): qty 8

## 2021-03-19 MED ORDER — VANCOMYCIN HCL 1500 MG/300ML IV SOLN
1500.0000 mg | Freq: Once | INTRAVENOUS | Status: AC
  Administered 2021-03-19: 03:00:00 1500 mg via INTRAVENOUS
  Filled 2021-03-19: qty 300

## 2021-03-19 MED ORDER — PANTOPRAZOLE SODIUM 40 MG IV SOLR
40.0000 mg | Freq: Two times a day (BID) | INTRAVENOUS | Status: DC
Start: 2021-03-19 — End: 2021-03-23
  Administered 2021-03-19 – 2021-03-23 (×9): 40 mg via INTRAVENOUS
  Filled 2021-03-19 (×9): qty 10

## 2021-03-19 MED ORDER — CYANOCOBALAMIN 1000 MCG/ML IJ SOLN
1000.0000 ug | Freq: Every day | INTRAMUSCULAR | Status: AC
  Administered 2021-03-19 – 2021-03-23 (×5): 1000 ug via INTRAMUSCULAR
  Filled 2021-03-19 (×5): qty 1

## 2021-03-19 MED ORDER — IOHEXOL 300 MG/ML  SOLN
100.0000 mL | Freq: Once | INTRAMUSCULAR | Status: AC | PRN
  Administered 2021-03-19: 100 mL via INTRAVENOUS

## 2021-03-19 MED ORDER — PIPERACILLIN-TAZOBACTAM 3.375 G IVPB 30 MIN
3.3750 g | Freq: Once | INTRAVENOUS | Status: AC
  Administered 2021-03-19: 03:00:00 3.375 g via INTRAVENOUS
  Filled 2021-03-19: qty 50

## 2021-03-19 MED ORDER — PROCHLORPERAZINE EDISYLATE 10 MG/2ML IJ SOLN: 10.0000 mg | Freq: Four times a day (QID) | INTRAMUSCULAR | Status: DC | PRN

## 2021-03-19 MED ORDER — SODIUM CHLORIDE 0.9 % IV BOLUS (SEPSIS)
1000.0000 mL | Freq: Once | INTRAVENOUS | Status: AC
  Administered 2021-03-19: 03:00:00 1000 mL via INTRAVENOUS

## 2021-03-19 NOTE — Progress Notes (Signed)
SLP Cancellation Note ? ?Patient Details ?Name: Jesse Nunez ?MRN: [id number] ?DOB: [birth year] ? ? ?Cancelled treatment:       Reason Eval/Treat Not Completed: Fatigue/lethargy limiting ability to participate. Discussed with RN who says pt is not sufficiently alert to try POs. RT present and getting ABG. Will f/u as able.  ? ? ? ?Osie Bond., M.A. CCC-SLP ?Acute Rehabilitation Services ?Pager [phone] ?Office [phone] ? ?03/19/2021, 9:15 AM ?

## 2021-03-19 NOTE — Consult Note (Addendum)
Consultation Note   Referring Provider: [facility] PCP: [facility] Gastroenterologist: Thornton Park, MD Reason for consultation:   nausea, vomiting                Assessment and Plan   # 81 yo male with dementia and other medical problems admitted with intermittent vomiting,  loose stool, dehydration, multifocal PNA from possible aspiration. Stomach distended with fluid on scan.  --Limited detail available as his sitter is with him only a few hours of the day .  She does say he appetite is good, he has no problems swallowing and generally emesis consists of fluid, no food. Distended stomach on imaging raises concern for at least a partial outlet obstruction which could be mechanical or functional ( ? Gastroparesis). --I don't think he needs an NGT right now. Not currently vomiting and not distended on exam  --Continue BID PPI for now. --Probable  upper GI or EGD at some point when medically stable. For not would not advance beyond clears.    ADDENDUM: I spoke with Jesse Nunez who is HCPOA. We discussed risks and benefits of EGD. Patient is a DNR. I explained that the DNR is usually temporarily rescinded during endoscopic procedures.  He needs a few minutes to think about the procedure and asked that I call him back.   I called Jesse Nunez again. He is agreeable to giving consent for EGD. He understands and is okay with the process of temporarily rescinding DNR during the procedure.    # Loose stool --Await C-diff studies   #Multifocal PNA --On Unasyn  # Prolonged QT interval  # Additional medical history listed below.    History of Present Illness:   Patient Profile:   Jesse Nunez is a 81 y.o. male with a past medical history significant for GERD, dementia, HTN, HLD, DM, OSA, COPD, CVA, CKD 3, cholelithiasis, diverticulosis, chronic constipation, colon polyps  See PMH for any additional medical problems.     History relevant to this admission:   Patient presented to ED yesterday from [facility] for evaluation of weakness, nausea and vomiting. Patient is very weak, has dementia and unable to provide history so the history comes from chart and also his sitter who is present today. The sitter began caring for patient about 3 weeks ago. She is with him only a few hours each day. Three weeks ago he had nausea / vomiting which quickly resolved but recurred about about 3 days ago. He eats well, has no problems swallowing but will vomit 20 minutes or so after eating. Patient hasn't complained of abdominal pain. Sitter hasn't seen any blood in stools. She doesn't think he takes NSAIDs except maybe a baby asa. However, he has also had episodes of vomiting in the afternoon not having just ate. Earlier in the week he began having loose BMs. He was prescribed Doxycycline for possible cellulitis but according to the personal sitter this was just started yesterday.   Labs: WBC 11.8, Hgb 12.4 ( at baseline). Glu 237 in ED. Liver chemistries normal.  Creatinine 1.52. Lactic acid 2.3  CTAP w/ contrast- multilobar PNA. Small hiatal hernia, small gallbladder stone. Mild fatty liver, 1.5 cm liver cyst. Stomach distended with fluid. 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   [facility] of colon cancer in mother, brother, both maternal grandparents   Previous GI Evaluations:    Endoscopies:  October 2016 at [facility] - The examined portion of the ileum was normal. Severe diverticulosis in the sigmoid colon, in the descending colon, at the splenic flexure and in the transverse colon. There was narrowing of the colon in association with the diverticular opening. There was evidence of diverticular spasm. Erythema was seen in association with the diverticular opening. --One 4 mm polyp in the transverse colon, removed with a  cold biopsy forceps. Resected and retrieved. --One 5 mm polyp in the sigmoid  colon, removed with a cold  snare. Resected and retrieved. Clip was placed. -Non-bleeding internal hemorrhoids.   *One polyp was hyperplastic and another was an inflammatory polyp  July 2022 colonoscopy ( Dr. Tarri Glenn) Non-bleeding external and internal hemorrhoids. - Diverticulosis in the sigmoid colon, in the descending colon and in the distal transverse colon. - Five 1 to 3 mm polyps in the descending colon, in the transverse colon, in the ascending colon and in the cecum, removed with a cold snare. Resected and retrieved. - The examination was otherwise normal on direct and retroflexion views. 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 - No source of weight loss identified on this study. FINAL MICROSCOPIC DIAGNOSIS:  A. COLON, CECUM, POLYPECTOMY:  - Tubular adenoma (s).  - No high-grade dysplasia or carcinoma    Diagnostic studies this admission   Imaging:   CT Head Wo Contrast  Result Date: 03/19/2021 CLINICAL DATA:  Neuro deficit, acute, stroke suspected Dementia patient with weakness, nausea and vomiting. EXAM: CT HEAD WITHOUT CONTRAST TECHNIQUE: Contiguous axial images were obtained from the base of the skull through the vertex without intravenous contrast. RADIATION DOSE REDUCTION: This exam was performed according to the departmental dose-optimization program which includes automated exposure control, adjustment of the mA and/or kV according to patient size and/or use of iterative reconstruction technique. COMPARISON:  Head CT and brain MRI 04/11/2020 FINDINGS: Brain: No acute intracranial hemorrhage. Similar degree of atrophy and chronic small vessel ischemic change. There are remote lacunar infarcts within the bilateral basal ganglia and thalami. Normal bilateral lacunar cerebellar infarcts. Unchanged left occipital infarct from prior exam. There is no CT evidence of acute ischemia. No hydrocephalus. No subdural or extra-axial collection. Vascular: Atherosclerosis of  skullbase vasculature without hyperdense vessel or abnormal calcification. Skull: No fracture or focal lesion. Sinuses/Orbits: Mucous retention cysts in both maxillary sinuses. Occasional opacification of ethmoid air cells. No mastoid effusion. Other: None. IMPRESSION: 1. No acute intracranial abnormality. 2. Unchanged atrophy, chronic small vessel ischemic change, and remote infarcts since April of 2022. Electronically Signed   By: Keith Rake M.D.   On: 03/19/2021 00:30   CT ABDOMEN PELVIS W CONTRAST  Result Date: 03/19/2021 CLINICAL DATA:  Nausea, vomiting and weakness. EXAM: CT ABDOMEN AND PELVIS WITH CONTRAST TECHNIQUE: Multidetector CT imaging of the abdomen and pelvis was performed using the standard protocol following bolus administration of intravenous contrast. RADIATION DOSE REDUCTION: This exam was performed according to the departmental dose-optimization program which includes automated exposure control, adjustment of the mA and/or kV according to patient size and/or use of iterative reconstruction technique. CONTRAST:  134m OMNIPAQUE IOHEXOL 300 MG/ML  SOLN COMPARISON:  CT with IV and oral contrast 05/08/2020, chest CT no contrast 07/04/2013. FINDINGS: Lower chest: The cardiac size is normal. There calcifications in the aortic valve annulus, circumflex and right coronary arteries. Small  hiatal hernia. Minimal left pleural effusion. There is increased airspace consolidation in the basal segments of both lower lobes, denser on the left and most likely due to pneumonia or aspiration. More anteriorly the remaining lung bases are clear. Factors affecting quality in the abdomen: Streak artifact from the patient's arms, and artifact due to respiratory motion. Hepatobiliary: 18.5 cm length mildly steatotic liver. No mass enhancement. 1.5 cm cyst in the dome of the right lobe in the anterior segment is again shown. Gallbladder contains at least 1 small stone, possibly two but there is no wall  thickening or biliary dilatation. No portal vein dilatation. Pancreas: Unremarkable. Spleen: Normal in size. Few tiny hypodensities are again noted but unchanged. No splenomegaly. Adrenals/Urinary Tract: There is no adrenal mass , no renal cortical mass , no evidence of urinary stones or obstruction. There are renovascular calcifications at the renal hila. Moderate bladder distention is seen with the dome reaching the level of L4-5. There is no wall thickening. Stomach/Bowel: Stomach is distended with fluid and there is fold thickening without inflammatory change. The unopacified small bowel is unremarkable. The appendix again noted surgically absent. There is colonic diverticulosis greatest along the left colon without evidence of acute diverticulitis or wall thickening. Vascular/Lymphatic: Heavy aortoiliac atherosclerosis. No AAA. No adenopathy. Reproductive: No prostatomegaly. Other: Small umbilical and inguinal fat hernias. There is no free air, hemorrhage or fluid. Musculoskeletal: There are chronic compression fractures, mild at T12, L2, and more prominently at L3 along the lower plate where there is posterior retropulsion moderately narrowing the spinal canal. This was seen previously and unchanged. L3 vertebral height loss remains at 50%. There is osteopenia and degenerative change. There is unilateral ankylosis across the left SI joint. IMPRESSION: 1. Bilateral lower lobe opacities, denser on the left consistent with multilobar pneumonia or aspiration. Minimal left pleural effusion. Follow-up CT recommended after treatment to ensure clearing. 2. No acute abnormality demonstrated in the abdomen or pelvis. 3. Cholelithiasis. 4. Aortic atherosclerosis. 5. Bladder distention without wall thickening. No significant prostate enlargement. 6. Advanced diverticulosis without evidence of diverticulitis. 7. Fold thickening in the stomach consistent with gastritis, and gastric fluid distention which could be due to  recent ingestion or impaired gastric emptying. 8. Remaining findings described above including chronic L3 moderate compression fracture from the lower plate with stenosing retropulsion. Electronically Signed   By: Telford Nab M.D.   On: 03/19/2021 00:51       Past Medical History:  Diagnosis Date   COPD (chronic obstructive pulmonary disease) ([place])    Depression    Diabetes ([place])    Family history of colon cancer    Hyperlipidemia    Hypertension    Macular degeneration disease    Shingles    Sleep apnea    Uses CPAP sometimes    Past Surgical History:  Procedure Laterality Date   APPENDECTOMY     COLONOSCOPY     COLONOSCOPY WITH PROPOFOL N/A 07/28/2020   Procedure: COLONOSCOPY WITH PROPOFOL;  Surgeon: Thornton Park, MD;  Location: [facility] ENDOSCOPY;  Service: Gastroenterology;  Laterality: N/A;   POLYPECTOMY  07/28/2020   Procedure: POLYPECTOMY;  Surgeon: Thornton Park, MD;  Location: [facility] ENDOSCOPY;  Service: Gastroenterology;;   TONSILLECTOMY     UPPER GASTROINTESTINAL ENDOSCOPY      Prior to Admission medications   Medication Sig Start Date End Date Taking? Authorizing Provider  acetaminophen (TYLENOL) 500 MG tablet Take 500 mg by mouth every 6 (six) hours as needed for moderate pain.   Yes  [provider]  albuterol (PROVENTIL HFA;VENTOLIN HFA) 108 (90 BASE) MCG/ACT inhaler Inhale 2 puffs into the lungs every 6 (six) hours as needed for wheezing or shortness of breath. 07/06/13  Yes Buriev, Arie Sabina, MD  aspirin EC 81 MG tablet Take 81 mg by mouth daily. Swallow whole.   Yes [provider]  atorvastatin (LIPITOR) 80 MG tablet Take 80 mg by mouth at bedtime. 03/21/17  Yes [provider]  doxycycline (VIBRA-TABS) 100 MG tablet Take 100 mg by mouth 2 (two) times daily. For 7 days 03/17/21  Yes [provider]  escitalopram (LEXAPRO) 10 MG tablet Take 10 mg by mouth daily. 03/17/21  Yes [provider]  fluticasone (FLONASE) 50  MCG/ACT nasal spray Place 2 sprays into both nostrils daily as needed for allergies.   Yes [provider]  lisinopril (ZESTRIL) 20 MG tablet Take 10 mg by mouth daily. 03/21/17  Yes [provider]  Multiple Vitamin (MULTIVITAMIN WITH MINERALS) TABS tablet Take 1 tablet by mouth daily.   Yes [provider]  NUTRITIONAL SUPPLEMENTS PO Take 237 mLs by mouth daily as needed (for additional calories).   Yes [provider]  omeprazole (PRILOSEC) 20 MG capsule Take 40 mg by mouth daily. 03/21/17  Yes [provider]  ondansetron (ZOFRAN-ODT) 4 MG disintegrating tablet Take 4 mg by mouth daily as needed for nausea. 03/17/21  Yes [provider]  predniSONE (DELTASONE) 20 MG tablet Take 20 mg by mouth daily. For 5 days 03/17/21  Yes [provider]  tamsulosin (FLOMAX) 0.4 MG CAPS capsule Take 0.4 mg by mouth at bedtime. 03/21/17  Yes [provider]  TRELEGY ELLIPTA 100-62.5-25 MCG/ACT AEPB Inhale 1 puff into the lungs daily. 01/29/21  Yes [provider]  zolpidem (AMBIEN) 5 MG tablet Take 1 tablet (5 mg total) by mouth at bedtime. Patient taking differently: Take 5 mg by mouth at bedtime as needed (sleep). 04/14/20  Yes Alma Friendly, MD    Current Facility-Administered Medications  Medication Dose Route Frequency Provider Last Rate Last Admin   acetaminophen (TYLENOL) tablet 650 mg  650 mg Oral Q6H PRN Chotiner, Yevonne Aline, MD       Or   acetaminophen (TYLENOL) suppository 650 mg  650 mg Rectal Q6H PRN Chotiner, Yevonne Aline, MD       Ampicillin-Sulbactam (UNASYN) 3 g in sodium chloride 0.9 % 100 mL IVPB  3 g Intravenous Q8H Poindexter, Leann T, RPH 200 mL/hr at 03/19/21 1144 3 g at 03/19/21 1144   insulin aspart (novoLOG) injection 0-9 Units  0-9 Units Subcutaneous Q6H PRN Chotiner, Yevonne Aline, MD       ipratropium-albuterol (DUONEB) 0.5-2.5 (3) MG/3ML nebulizer solution 3 mL  3 mL Nebulization Q6H PRN Chotiner, Yevonne Aline,  MD       lactated ringers infusion   Intravenous Continuous Regalado, Belkys A, MD 100 mL/hr at 03/19/21 0948 Rate Change at 03/19/21 0948   pantoprazole (PROTONIX) injection 40 mg  40 mg Intravenous Q12H Regalado, Belkys A, MD   40 mg at 03/19/21 0845   prochlorperazine (COMPAZINE) injection 10 mg  10 mg Intravenous Q6H PRN Chotiner, Yevonne Aline, MD       thiamine '500mg'$  in normal saline (28m) IVPB  500 mg Intravenous Daily Regalado, Belkys A, MD        Allergies as of 03/18/2021 - Review Complete 03/18/2021  Allergen Reaction Noted   Glipizide Nausea And Vomiting 02/04/2020    Family History  Problem  Relation Age of Onset   Colon cancer Mother    Hypertension Mother    Hypertension Father    Pancreatitis Father    Colon cancer Brother    Colon cancer Maternal Grandfather    Colon polyps Maternal Grandfather     Social History   Socioeconomic History   Marital status: Single    Spouse name: Not on file   Number of children: Not on file   Years of education: Not on file   Highest education level: Not on file  Occupational History   Not on file  Tobacco Use   Smoking status: Former   Smokeless tobacco: Never  Vaping Use   Vaping Use: Never used  Substance and Sexual Activity   Alcohol use: No   Drug use: No   Sexual activity: Not Currently  Other Topics Concern   Not on file  Social History Narrative   Not on file   Social Determinants of Health   Financial Resource Strain: Not on file  Food Insecurity: Not on file  Transportation Needs: Not on file  Physical Activity: Not on file  Stress: Not on file  Social Connections: Not on file  Intimate Partner Violence: Not on file    Review of Systems: All systems reviewed and negative except where noted in HPI.   OBJECTIVE    Physical Exam: Vital signs in last 24 hours: Temp:  [97.5 F (36.4 C)-98 F (36.7 C)] 97.7 F (36.5 C) (03/09 0929) Pulse Rate:  [34-80] 52 (03/09 0929) Resp:  [12-31] 20 (03/09  0929) BP: (127-181)/(71-91) 166/71 (03/09 0929) SpO2:  [92 %-100 %] 100 % (03/09 0929) Weight:  [72.6 kg-75.5 kg] 75.5 kg (03/09 0448) Last BM Date :  (UTA, pt cant remember, none per night shift, none yet today)  General:  Awake up periodically. In NAD Psych:  cooperative.  Eyes: Pupils equal, no icterus. Conjunctive pink Ears:  Normal auditory acuity Nose: No deformity, discharge or lesions Neck:  Supple, no masses felt Lungs:  Shallow respiration, decreased breath sounds at bases. No wheezing / crackles could be heard.   Heart:  Regular rate, regular rhythm. No lower extremity edema Abdomen:  Soft, nondistended, nontender, active bowel sounds, no masses felt Rectal :  Deferred Msk: Symmetrical without gross deformities.  Neurologic:  Opens eyes when spoken to. Tries to answer questions but very too and voice barely audible.  Skin:  Intact without significant lesions.    Scheduled inpatient medications  pantoprazole (PROTONIX) IV  40 mg Intravenous Q12H    Intake/Output from previous day: 03/08 0701 - 03/09 0700 In: 2400 [IV Piggyback:2400] Out: 1200 [Urine:1200] Intake/Output this shift: Total I/O In: 552.1 [I.V.:552.1] Out: -    Lab Results: Recent Labs    03/18/21 2307 03/18/21 2326 03/19/21 0641  WBC 8.7  --  11.6*  HGB 13.9 14.3 12.4*  HCT 42.6 42.0 37.8*  PLT 232  --  183   BMET Recent Labs    03/18/21 2307 03/18/21 2326 03/19/21 0641  NA 138 140 139  K 4.3 4.3 4.8  CL 103 105 110  CO2 23  --  18*  GLUCOSE 231* 237* 172*  BUN 31* 30* 28*  CREATININE 1.53* 1.50* 1.52*  CALCIUM 9.6  --  8.7*   LFTs Recent Labs    03/18/21 2307  PROT 7.9  ALBUMIN 3.8  AST 21  ALT 25  ALKPHOS 61  BILITOT 0.7   . CBC Latest Ref Rng & Units 03/19/2021 03/18/2021 03/18/2021  WBC 4.0 - 10.5 K/uL 11.6(H) - 8.7  Hemoglobin 13.0 - 17.0 g/dL 12.4(L) 14.3 13.9  Hematocrit 39.0 - 52.0 % 37.8(L) 42.0 42.6  Platelets 150 - 400 K/uL 183 - 232    . CMP Latest Ref Rng &  Units 03/19/2021 03/18/2021 03/18/2021  Glucose 70 - 99 mg/dL 172(H) 237(H) 231(H)  BUN 8 - 23 mg/dL 28(H) 30(H) 31(H)  Creatinine 0.61 - 1.24 mg/dL 1.52(H) 1.50(H) 1.53(H)  Sodium 135 - 145 mmol/L 139 140 138  Potassium 3.5 - 5.1 mmol/L 4.8 4.3 4.3  Chloride 98 - 111 mmol/L 110 105 103  CO2 22 - 32 mmol/L 18(L) - 23  Calcium 8.9 - 10.3 mg/dL 8.7(L) - 9.6  Total Protein 6.5 - 8.1 g/dL - - 7.9  Total Bilirubin 0.3 - 1.2 mg/dL - - 0.7  Alkaline Phos 38 - 126 U/L - - 61  AST 15 - 41 U/L - - 21  ALT 0 - 44 U/L - - 25    Tye Savoy, NP-C @  03/19/2021, 12:07 PM  _______________________________________________________________________________________________________________________________________  Velora Heckler GI MD note:  I personally examined the patient, reviewed the data and agree with the assessment and plan described above.  I provided a substantive portion of the care of this patient (personally provided more than half of the total time dedicated to the treatment of this patient). I saw him after his sitter had left, he is oriented to self only with dementia.  He does deny any discomfort.  He's been vomiting a lot for the past few weeks and his stomach is abnormal on CT (dilated and to me the distal stomach is a bit anatomically abnormal). The vomiting has probably caused his multifocal pneumonia and am planning EGD tomorrow for further evaluation.  Owens Loffler, MD [facility]astroenterology Pager [phone]

## 2021-03-19 NOTE — Plan of Care (Signed)
  Problem: Activity: Goal: Ability to tolerate increased activity will improve Outcome: Progressing   Problem: Clinical Measurements: Goal: Ability to maintain a body temperature in the normal range will improve Outcome: Progressing   Problem: Respiratory: Goal: Ability to maintain adequate ventilation will improve Outcome: Progressing   

## 2021-03-19 NOTE — Assessment & Plan Note (Addendum)
Suspect in the setting of infection, aspiration pneumonia, some underlying dementia, and acute CVA.  He has been declining per nephew over last few months. As he became more alert, right side deficit was notice more. MRI positive for stroke. Ammonia: 12, B:12: 273, vitamin D 23, TSH 0.75 , RPR Non reactive. Blood gas negative for hypercapnia.  Recommend continued goals of care discussions outpatient. ? ?

## 2021-03-19 NOTE — Assessment & Plan Note (Addendum)
Presents with vomiting, CT chest ; showed bilateral lower lobe opacity denser on the left consistent with multilobar pneumonia or aspiration.  He also presented with worsening altered mental status.  Patient was started on IV Unasyn and transitioned to Augmentin at time of discharge to complete antibiotic course.  Seen by speech therapy with recommendations of thin liquids, aspiration precautions. ?

## 2021-03-19 NOTE — H&P (View-Only) (Signed)
Consultation Note   Referring Provider: [facility] PCP: [facility] [place] Gastroenterologist: Thornton Park, MD Reason for consultation:   nausea, vomiting                Assessment and Plan   # 81 yo male with dementia and other medical problems admitted with intermittent vomiting,  loose stool, dehydration, multifocal PNA from possible aspiration. Stomach distended with fluid on scan.  --Limited detail available as his sitter is with him only a few hours of the day .  She does say he appetite is good, he has no problems swallowing and generally emesis consists of fluid, no food. Distended stomach on imaging raises concern for at least a partial outlet obstruction which could be mechanical or functional ( ? Gastroparesis). --I don't think he needs an NGT right now. Not currently vomiting and not distended on exam  --Continue BID PPI for now. --Probable  upper GI or EGD at some point when medically stable. For not would not advance beyond clears.    ADDENDUM: I spoke with Jesse Nunez who is HCPOA. We discussed risks and benefits of EGD. Patient is a DNR. I explained that the DNR is usually temporarily rescinded during endoscopic procedures.  He needs a few minutes to think about the procedure and asked that I call him back.   I called Jesse Nunez again. He is agreeable to giving consent for EGD. He understands and is okay with the process of temporarily rescinding DNR during the procedure.    # Loose stool --Await C-diff studies   #Multifocal PNA --On Unasyn  # Prolonged QT interval  # Additional medical history listed below.    History of Present Illness:   Patient Profile:   Jesse Nunez is a 81 y.o. male with a past medical history significant for GERD, dementia, HTN, HLD, DM, OSA, COPD, CVA, CKD 3, cholelithiasis, diverticulosis, chronic constipation, colon polyps  See PMH for any additional medical problems.     History relevant to this admission:   Patient presented to ED yesterday from [facility] for evaluation of weakness, nausea and vomiting. Patient is very weak, has dementia and unable to provide history so the history comes from chart and also his sitter who is present today. The sitter began caring for patient about 3 weeks ago. She is with him only a few hours each day. Three weeks ago he had nausea / vomiting which quickly resolved but recurred about about 3 days ago. He eats well, has no problems swallowing but will vomit 20 minutes or so after eating. Patient hasn't complained of abdominal pain. Sitter hasn't seen any blood in stools. She doesn't think he takes NSAIDs except maybe a baby asa. However, he has also had episodes of vomiting in the afternoon not having just ate. Earlier in the week he began having loose BMs. He was prescribed Doxycycline for possible cellulitis but according to the personal sitter this was just started yesterday.   Labs: WBC 11.8, Hgb 12.4 ( at baseline). Glu 237 in ED. Liver chemistries normal.  Creatinine 1.52. Lactic acid 2.3  CTAP w/ contrast- multilobar PNA. Small hiatal hernia, small gallbladder stone. Mild fatty liver, 1.5 cm liver cyst. Stomach distended with fluid. 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   [facility] of colon cancer in mother, brother, both maternal grandparents   Previous GI Evaluations:    Endoscopies:  October 2016 at [facility] - The examined portion of the ileum was normal. Severe diverticulosis in the sigmoid colon, in the descending colon, at the splenic flexure and in the transverse colon. There was narrowing of the colon in association with the diverticular opening. There was evidence of diverticular spasm. Erythema was seen in association with the diverticular opening. --One 4 mm polyp in the transverse colon, removed with a  cold biopsy forceps. Resected and retrieved. --One 5 mm polyp in the sigmoid  colon, removed with a cold  snare. Resected and retrieved. Clip was placed. -Non-bleeding internal hemorrhoids.   *One polyp was hyperplastic and another was an inflammatory polyp  July 2022 colonoscopy ( Dr. Tarri Glenn) Non-bleeding external and internal hemorrhoids. - Diverticulosis in the sigmoid colon, in the descending colon and in the distal transverse colon. - Five 1 to 3 mm polyps in the descending colon, in the transverse colon, in the ascending colon and in the cecum, removed with a cold snare. Resected and retrieved. - The examination was otherwise normal on direct and retroflexion views. 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 - No source of weight loss identified on this study. FINAL MICROSCOPIC DIAGNOSIS:  A. COLON, CECUM, POLYPECTOMY:  - Tubular adenoma (s).  - No high-grade dysplasia or carcinoma    Diagnostic studies this admission   Imaging:   CT Head Wo Contrast  Result Date: 03/19/2021 CLINICAL DATA:  Neuro deficit, acute, stroke suspected Dementia patient with weakness, nausea and vomiting. EXAM: CT HEAD WITHOUT CONTRAST TECHNIQUE: Contiguous axial images were obtained from the base of the skull through the vertex without intravenous contrast. RADIATION DOSE REDUCTION: This exam was performed according to the departmental dose-optimization program which includes automated exposure control, adjustment of the mA and/or kV according to patient size and/or use of iterative reconstruction technique. COMPARISON:  Head CT and brain MRI 04/11/2020 FINDINGS: Brain: No acute intracranial hemorrhage. Similar degree of atrophy and chronic small vessel ischemic change. There are remote lacunar infarcts within the bilateral basal ganglia and thalami. Normal bilateral lacunar cerebellar infarcts. Unchanged left occipital infarct from prior exam. There is no CT evidence of acute ischemia. No hydrocephalus. No subdural or extra-axial collection. Vascular: Atherosclerosis of  skullbase vasculature without hyperdense vessel or abnormal calcification. Skull: No fracture or focal lesion. Sinuses/Orbits: Mucous retention cysts in both maxillary sinuses. Occasional opacification of ethmoid air cells. No mastoid effusion. Other: None. IMPRESSION: 1. No acute intracranial abnormality. 2. Unchanged atrophy, chronic small vessel ischemic change, and remote infarcts since April of 2022. Electronically Signed   By: Keith Rake M.D.   On: 03/19/2021 00:30   CT ABDOMEN PELVIS W CONTRAST  Result Date: 03/19/2021 CLINICAL DATA:  Nausea, vomiting and weakness. EXAM: CT ABDOMEN AND PELVIS WITH CONTRAST TECHNIQUE: Multidetector CT imaging of the abdomen and pelvis was performed using the standard protocol following bolus administration of intravenous contrast. RADIATION DOSE REDUCTION: This exam was performed according to the departmental dose-optimization program which includes automated exposure control, adjustment of the mA and/or kV according to patient size and/or use of iterative reconstruction technique. CONTRAST:  175m OMNIPAQUE IOHEXOL 300 MG/ML  SOLN COMPARISON:  CT with IV and oral contrast 05/08/2020, chest CT no contrast 07/04/2013. FINDINGS: Lower chest: The cardiac size is normal. There calcifications in the aortic valve annulus, circumflex and right coronary arteries. Small  hiatal hernia. Minimal left pleural effusion. There is increased airspace consolidation in the basal segments of both lower lobes, denser on the left and most likely due to pneumonia or aspiration. More anteriorly the remaining lung bases are clear. Factors affecting quality in the abdomen: Streak artifact from the patient's arms, and artifact due to respiratory motion. Hepatobiliary: 18.5 cm length mildly steatotic liver. No mass enhancement. 1.5 cm cyst in the dome of the right lobe in the anterior segment is again shown. Gallbladder contains at least 1 small stone, possibly two but there is no wall  thickening or biliary dilatation. No portal vein dilatation. Pancreas: Unremarkable. Spleen: Normal in size. Few tiny hypodensities are again noted but unchanged. No splenomegaly. Adrenals/Urinary Tract: There is no adrenal mass , no renal cortical mass , no evidence of urinary stones or obstruction. There are renovascular calcifications at the renal hila. Moderate bladder distention is seen with the dome reaching the level of L4-5. There is no wall thickening. Stomach/Bowel: Stomach is distended with fluid and there is fold thickening without inflammatory change. The unopacified small bowel is unremarkable. The appendix again noted surgically absent. There is colonic diverticulosis greatest along the left colon without evidence of acute diverticulitis or wall thickening. Vascular/Lymphatic: Heavy aortoiliac atherosclerosis. No AAA. No adenopathy. Reproductive: No prostatomegaly. Other: Small umbilical and inguinal fat hernias. There is no free air, hemorrhage or fluid. Musculoskeletal: There are chronic compression fractures, mild at T12, L2, and more prominently at L3 along the lower plate where there is posterior retropulsion moderately narrowing the spinal canal. This was seen previously and unchanged. L3 vertebral height loss remains at 50%. There is osteopenia and degenerative change. There is unilateral ankylosis across the left SI joint. IMPRESSION: 1. Bilateral lower lobe opacities, denser on the left consistent with multilobar pneumonia or aspiration. Minimal left pleural effusion. Follow-up CT recommended after treatment to ensure clearing. 2. No acute abnormality demonstrated in the abdomen or pelvis. 3. Cholelithiasis. 4. Aortic atherosclerosis. 5. Bladder distention without wall thickening. No significant prostate enlargement. 6. Advanced diverticulosis without evidence of diverticulitis. 7. Fold thickening in the stomach consistent with gastritis, and gastric fluid distention which could be due to  recent ingestion or impaired gastric emptying. 8. Remaining findings described above including chronic L3 moderate compression fracture from the lower plate with stenosing retropulsion. Electronically Signed   By: Telford Nab M.D.   On: 03/19/2021 00:51       Past Medical History:  Diagnosis Date   COPD (chronic obstructive pulmonary disease) ([place])    Depression    Diabetes ([place])    Family history of colon cancer    Hyperlipidemia    Hypertension    Macular degeneration disease    Shingles    Sleep apnea    Uses CPAP sometimes    Past Surgical History:  Procedure Laterality Date   APPENDECTOMY     COLONOSCOPY     COLONOSCOPY WITH PROPOFOL N/A 07/28/2020   Procedure: COLONOSCOPY WITH PROPOFOL;  Surgeon: Thornton Park, MD;  Location: [facility];  Service: Gastroenterology;  Laterality: N/A;   POLYPECTOMY  07/28/2020   Procedure: POLYPECTOMY;  Surgeon: Thornton Park, MD;  Location: [facility];  Service: Gastroenterology;;   TONSILLECTOMY     UPPER GASTROINTESTINAL ENDOSCOPY      Prior to Admission medications   Medication Sig Start Date End Date Taking? Authorizing Provider  acetaminophen (TYLENOL) 500 MG tablet Take 500 mg by mouth every 6 (six) hours as needed for moderate pain.   Yes  [provider]  albuterol (PROVENTIL HFA;VENTOLIN HFA) 108 (90 BASE) MCG/ACT inhaler Inhale 2 puffs into the lungs every 6 (six) hours as needed for wheezing or shortness of breath. 07/06/13  Yes Buriev, Arie Sabina, MD  aspirin EC 81 MG tablet Take 81 mg by mouth daily. Swallow whole.   Yes [provider]  atorvastatin (LIPITOR) 80 MG tablet Take 80 mg by mouth at bedtime. 03/21/17  Yes [provider]  doxycycline (VIBRA-TABS) 100 MG tablet Take 100 mg by mouth 2 (two) times daily. For 7 days 03/17/21  Yes [provider]  escitalopram (LEXAPRO) 10 MG tablet Take 10 mg by mouth daily. 03/17/21  Yes [provider]  fluticasone (FLONASE) 50  MCG/ACT nasal spray Place 2 sprays into both nostrils daily as needed for allergies.   Yes [provider]  lisinopril (ZESTRIL) 20 MG tablet Take 10 mg by mouth daily. 03/21/17  Yes [provider]  Multiple Vitamin (MULTIVITAMIN WITH MINERALS) TABS tablet Take 1 tablet by mouth daily.   Yes [provider]  NUTRITIONAL SUPPLEMENTS PO Take 237 mLs by mouth daily as needed (for additional calories).   Yes [provider]  omeprazole (PRILOSEC) 20 MG capsule Take 40 mg by mouth daily. 03/21/17  Yes [provider]  ondansetron (ZOFRAN-ODT) 4 MG disintegrating tablet Take 4 mg by mouth daily as needed for nausea. 03/17/21  Yes [provider]  predniSONE (DELTASONE) 20 MG tablet Take 20 mg by mouth daily. For 5 days 03/17/21  Yes [provider]  tamsulosin (FLOMAX) 0.4 MG CAPS capsule Take 0.4 mg by mouth at bedtime. 03/21/17  Yes [provider]  TRELEGY ELLIPTA 100-62.5-25 MCG/ACT AEPB Inhale 1 puff into the lungs daily. 01/29/21  Yes [provider]  zolpidem (AMBIEN) 5 MG tablet Take 1 tablet (5 mg total) by mouth at bedtime. Patient taking differently: Take 5 mg by mouth at bedtime as needed (sleep). 04/14/20  Yes Alma Friendly, MD    Current Facility-Administered Medications  Medication Dose Route Frequency Provider Last Rate Last Admin   acetaminophen (TYLENOL) tablet 650 mg  650 mg Oral Q6H PRN Chotiner, Yevonne Aline, MD       Or   acetaminophen (TYLENOL) suppository 650 mg  650 mg Rectal Q6H PRN Chotiner, Yevonne Aline, MD       Ampicillin-Sulbactam (UNASYN) 3 g in sodium chloride 0.9 % 100 mL IVPB  3 g Intravenous Q8H Poindexter, Leann T, RPH 200 mL/hr at 03/19/21 1144 3 g at 03/19/21 1144   insulin aspart (novoLOG) injection 0-9 Units  0-9 Units Subcutaneous Q6H PRN Chotiner, Yevonne Aline, MD       ipratropium-albuterol (DUONEB) 0.5-2.5 (3) MG/3ML nebulizer solution 3 mL  3 mL Nebulization Q6H PRN Chotiner, Yevonne Aline,  MD       lactated ringers infusion   Intravenous Continuous Regalado, Belkys A, MD 100 mL/hr at 03/19/21 0948 Rate Change at 03/19/21 0948   pantoprazole (PROTONIX) injection 40 mg  40 mg Intravenous Q12H Regalado, Belkys A, MD   40 mg at 03/19/21 0845   prochlorperazine (COMPAZINE) injection 10 mg  10 mg Intravenous Q6H PRN Chotiner, Yevonne Aline, MD       thiamine '500mg'$  in normal saline (12m) IVPB  500 mg Intravenous Daily Regalado, Belkys A, MD        Allergies as of 03/18/2021 - Review Complete 03/18/2021  Allergen Reaction Noted   Glipizide Nausea And Vomiting 02/04/2020    Family History  Problem  Relation Age of Onset   Colon cancer Mother    Hypertension Mother    Hypertension Father    Pancreatitis Father    Colon cancer Brother    Colon cancer Maternal Grandfather    Colon polyps Maternal Grandfather     Social History   Socioeconomic History   Marital status: Single    Spouse name: Not on file   Number of children: Not on file   Years of education: Not on file   Highest education level: Not on file  Occupational History   Not on file  Tobacco Use   Smoking status: Former   Smokeless tobacco: Never  Vaping Use   Vaping Use: Never used  Substance and Sexual Activity   Alcohol use: No   Drug use: No   Sexual activity: Not Currently  Other Topics Concern   Not on file  Social History Narrative   Not on file   Social Determinants of Health   Financial Resource Strain: Not on file  Food Insecurity: Not on file  Transportation Needs: Not on file  Physical Activity: Not on file  Stress: Not on file  Social Connections: Not on file  Intimate Partner Violence: Not on file    Review of Systems: All systems reviewed and negative except where noted in HPI.   OBJECTIVE    Physical Exam: Vital signs in last 24 hours: Temp:  [97.5 F (36.4 C)-98 F (36.7 C)] 97.7 F (36.5 C) (03/09 0929) Pulse Rate:  [34-80] 52 (03/09 0929) Resp:  [12-31] 20 (03/09  0929) BP: (127-181)/(71-91) 166/71 (03/09 0929) SpO2:  [92 %-100 %] 100 % (03/09 0929) Weight:  [72.6 kg-75.5 kg] 75.5 kg (03/09 0448) Last BM Date :  (UTA, pt cant remember, none per night shift, none yet today)  General:  Awake up periodically. In NAD Psych:  cooperative.  Eyes: Pupils equal, no icterus. Conjunctive pink Ears:  Normal auditory acuity Nose: No deformity, discharge or lesions Neck:  Supple, no masses felt Lungs:  Shallow respiration, decreased breath sounds at bases. No wheezing / crackles could be heard.   Heart:  Regular rate, regular rhythm. No lower extremity edema Abdomen:  Soft, nondistended, nontender, active bowel sounds, no masses felt Rectal :  Deferred Msk: Symmetrical without gross deformities.  Neurologic:  Opens eyes when spoken to. Tries to answer questions but very too and voice barely audible.  Skin:  Intact without significant lesions.    Scheduled inpatient medications  pantoprazole (PROTONIX) IV  40 mg Intravenous Q12H    Intake/Output from previous day: 03/08 0701 - 03/09 0700 In: 2400 [IV Piggyback:2400] Out: 1200 [Urine:1200] Intake/Output this shift: Total I/O In: 552.1 [I.V.:552.1] Out: -    Lab Results: Recent Labs    03/18/21 2307 03/18/21 2326 03/19/21 0641  WBC 8.7  --  11.6*  HGB 13.9 14.3 12.4*  HCT 42.6 42.0 37.8*  PLT 232  --  183   BMET Recent Labs    03/18/21 2307 03/18/21 2326 03/19/21 0641  NA 138 140 139  K 4.3 4.3 4.8  CL 103 105 110  CO2 23  --  18*  GLUCOSE 231* 237* 172*  BUN 31* 30* 28*  CREATININE 1.53* 1.50* 1.52*  CALCIUM 9.6  --  8.7*   LFTs Recent Labs    03/18/21 2307  PROT 7.9  ALBUMIN 3.8  AST 21  ALT 25  ALKPHOS 61  BILITOT 0.7   . CBC Latest Ref Rng & Units 03/19/2021 03/18/2021 03/18/2021  WBC 4.0 - 10.5 K/uL 11.6(H) - 8.7  Hemoglobin 13.0 - 17.0 g/dL 12.4(L) 14.3 13.9  Hematocrit 39.0 - 52.0 % 37.8(L) 42.0 42.6  Platelets 150 - 400 K/uL 183 - 232    . CMP Latest Ref Rng &  Units 03/19/2021 03/18/2021 03/18/2021  Glucose 70 - 99 mg/dL 172(H) 237(H) 231(H)  BUN 8 - 23 mg/dL 28(H) 30(H) 31(H)  Creatinine 0.61 - 1.24 mg/dL 1.52(H) 1.50(H) 1.53(H)  Sodium 135 - 145 mmol/L 139 140 138  Potassium 3.5 - 5.1 mmol/L 4.8 4.3 4.3  Chloride 98 - 111 mmol/L 110 105 103  CO2 22 - 32 mmol/L 18(L) - 23  Calcium 8.9 - 10.3 mg/dL 8.7(L) - 9.6  Total Protein 6.5 - 8.1 g/dL - - 7.9  Total Bilirubin 0.3 - 1.2 mg/dL - - 0.7  Alkaline Phos 38 - 126 U/L - - 61  AST 15 - 41 U/L - - 21  ALT 0 - 44 U/L - - 25    Tye Savoy, NP-C @  03/19/2021, 12:07 PM  _______________________________________________________________________________________________________________________________________  Velora Heckler GI MD note:  I personally examined the patient, reviewed the data and agree with the assessment and plan described above.  I provided a substantive portion of the care of this patient (personally provided more than half of the total time dedicated to the treatment of this patient). I saw him after his sitter had left, he is oriented to self only with dementia.  He does deny any discomfort.  He's been vomiting a lot for the past few weeks and his stomach is abnormal on CT (dilated and to me the distal stomach is a bit anatomically abnormal). The vomiting has probably caused his multifocal pneumonia and am planning EGD tomorrow for further evaluation.  Owens Loffler, MD [facility] Gastroenterology Pager [phone]

## 2021-03-19 NOTE — Progress Notes (Signed)
A consult was received from an ED physician for Vancomycin and Zosyn per pharmacy dosing.  The patient's profile has been reviewed for ht/wt/allergies/indication/available labs.   ? ?A one time order has been placed for Vancomycin '1500mg'$  IV and Zosyn 3.375gm IV.   ? ?Further antibiotics/pharmacy consults should be ordered by admitting physician if indicated.       ?                ?Thank you, ?Everette Rank, PharmD ?03/19/2021  1:26 AM ? ?

## 2021-03-19 NOTE — Progress Notes (Signed)
Responded to consult for IV. Noted pt has current working IV. Discussed access needs with RN who states pt is "sepsis" status. 2nd IV obtained. ?

## 2021-03-19 NOTE — Progress Notes (Signed)
Pt being followed by [facility] for Sepsis protocol. 

## 2021-03-19 NOTE — Progress Notes (Signed)
PHARMACY NOTE:  ANTIMICROBIAL RENAL DOSAGE ADJUSTMENT ? ?Current antimicrobial regimen includes a mismatch between antimicrobial dosage and estimated renal function.  As per policy approved by the Pharmacy & Therapeutics and Medical Executive Committees, the antimicrobial dosage will be adjusted accordingly. ? ?Current antimicrobial dosage:  Unasyn 3gm IV 16h ? ?Indication: aspiration pneumonia ? ?Renal Function: ? ?Estimated Creatinine Clearance: 41.8 mL/min (A) (by C-G formula based on SCr of 1.5 mg/dL (H)). ?  ?Antimicrobial dosage has been changed to:  Unasyn 3gm IV q8h ? ? ?Thank you for allowing pharmacy to be a part of this patient's care. ? ?Everette Rank, RPH ?03/19/2021 4:51 AM ?

## 2021-03-19 NOTE — Hospital Course (Addendum)
Jesse Nunez is a 81 year old male with past medical history significant for diabetes type 2, hypertension, OSA, hyperlipidemia, history of CVA who presents for evaluation of nausea vomiting and diarrhea for the last few days.  Patient has had poor oral intake over the last few days.  He has been more lethargic and less interactive for the last 2 days.  He has been having issues with nausea and vomiting for the last several months.  Patient was initially admitted to [facility] for aspiration ammonia and started on empiric antibiotics with Unasyn.  Patient also with recurrent nausea/vomiting diarrhea and underwent endoscopy by [facility] notable for mild gastritis.  Patient was also noted to have right arm weakness on 3/10 and patient was transferred to [facility] on 3/11 for neurology and stroke team evaluation. ?

## 2021-03-19 NOTE — Assessment & Plan Note (Addendum)
He has been having issues with nausea, vomiting for couple of month cyclic. CT abdomen and pelvis showed; fold thickening of the stomach consistent with gastritis, and gastric fluid distention which could be due to recent ingestion or impaired gastric emptying.  Seen by gastroenterology and underwent EGD with mild gastritis as finding.  Continue Zofran as needed. ? ?

## 2021-03-19 NOTE — Assessment & Plan Note (Addendum)
Baseline creatinine 1.4-1.6.  Creatinine on discharge 1.35.  Continue to encourage increased oral intake. ?

## 2021-03-19 NOTE — Assessment & Plan Note (Addendum)
Hemoglobin A1c 6.6, well controlled.  Diet controlled. ?

## 2021-03-19 NOTE — Assessment & Plan Note (Addendum)
Diarrhea has resolved

## 2021-03-19 NOTE — TOC Initial Note (Signed)
Transition of Care (TOC) - Initial/Assessment Note  ? ? ?Patient Details  ?Name: Jesse Nunez ?MRN: [id number] ?Date of Birth: [birth year] ? ?Transition of Care (TOC) CM/SW Contact:    ?Sakiya Stepka, LCSW ?Phone Number: ?03/19/2021, 1:14 PM ? ?Clinical Narrative:                 ?Referral received to assist with dc planning needs.  Attempted to speak with pt, however, he is unarousable and cannot engage.  I did contact pt's nephew (and POA), Alla German, to introduce self/ TOC role.  Nephew confirms that pt is in an [place] apt at [facility] but does have a private duty agency providing daily morning visits (~ 3hrs each) to assist with ADLs, medication and "just get him up and moving".  Nephew feels that the addition of these services has been very positive noting that pt is inclined to stay in bed all day if no one is there to motivate.  Nephew does report that his first choice for pt's dc plan would be to return to the IL apt if at all possible.  He would continue with the private duty service and may be financially able to increase hours slightly if needed.  He reports that MD had discussed Palliative consult which he is very agreeable to - welcomes all input from MDs and others in making plans moving forward.  Explained to nephew that we will await therapy evaluations and then regroup with him on recommendations and best plans for dc care. ? ?Expected Discharge Plan:  (returh to [place] apt vs possible SNF) ?Barriers to Discharge: Continued Medical Work up ? ? ?Patient Goals and CMS Choice ?Patient states their goals for this hospitalization and ongoing recovery are:: unable to state ?  ?  ? ?Expected Discharge Plan and Services ?Expected Discharge Plan:  (returh to [place] apt vs possible SNF) ?In-house Referral: Clinical Social Work ?  ?  ?Living arrangements for the past 2 months: [facility]) ?                ?  ?  ?  ?  ?  ?  ?  ?  ?  ?  ? ?Prior Living Arrangements/Services ?Living  arrangements for the past 2 months: [facility]) ?Lives with:: Self ?Patient language and need for interpreter reviewed:: Yes ?       ?Need for Family Participation in Patient Care: No (Comment) ?Care giver support system in place?: Yes (comment) ?  ?Criminal Activity/Legal Involvement Pertinent to Current Situation/Hospitalization: No - Comment as needed ? ?Activities of Daily Living ?Home Assistive Devices/Equipment: None ?ADL Screening (condition at time of admission) ?Patient's cognitive ability adequate to safely complete daily activities?: No ?Is the patient deaf or have difficulty hearing?: No ?Does the patient have difficulty seeing, even when wearing glasses/contacts?: No ?Does the patient have difficulty concentrating, remembering, or making decisions?: Yes ?Patient able to express need for assistance with ADLs?: No ?Does the patient have difficulty dressing or bathing?: Yes ?Independently performs ADLs?: No ?Communication: Independent ?Dressing (OT): Needs assistance ?Is this a change from baseline?: Pre-admission baseline ?Grooming: Needs assistance ?Is this a change from baseline?: Pre-admission baseline ?Feeding: Independent ?Bathing: Needs assistance ?Is this a change from baseline?: Pre-admission baseline ?Toileting: Needs assistance ?Is this a change from baseline?: Pre-admission baseline ?In/Out Bed: Needs assistance ?Is this a change from baseline?: Pre-admission baseline ?Walks in Home: Needs assistance ?Is this a change from baseline?: Pre-admission baseline ?Does the  patient have difficulty walking or climbing stairs?: Yes ?Weakness of Legs: Both ?Weakness of Arms/Hands: Both ? ?Permission Sought/Granted ?Permission sought to share information with : Family Supports ?  ? Share Information with NAME: Alla German ?   ? Permission granted to share info w Relationship: nephew/ POA ? Permission granted to share info w Contact Information: [phone] ? ?Emotional  Assessment ?Appearance:: Appears stated age ?Attitude/Demeanor/Rapport: Unresponsive ?  ?Orientation: : Oriented to Self (per RN assessment) ?Alcohol / Substance Use: Not Applicable ?Psych Involvement: No (comment) ? ?Admission diagnosis:  Urine retention [R33.9] ?Nausea vomiting and diarrhea [R11.2, R19.7] ?Aspiration pneumonia of both lower lobes due to gastric secretions ([place]) [J69.0] ?Multifocal pneumonia [J18.9] ?Sepsis without acute organ dysfunction, due to unspecified organism [facility]) [A41.9] ?Patient Active Problem List  ? Diagnosis Date Noted  ? Multifocal pneumonia 03/19/2021  ? CKD (chronic kidney disease) stage 3, GFR 30-59 ml/min (HCC) 03/19/2021  ? Diarrhea 03/19/2021  ? Type 2 diabetes mellitus with hyperglycemia ([place]) 03/19/2021  ? Adenomatous polyp of ascending colon   ? Family history of colon cancer 06/24/2020  ? LLQ abdominal pain 06/24/2020  ? Loss of weight 06/24/2020  ? Other constipation 06/24/2020  ? Prolonged QT interval 04/12/2020  ? Generalized weakness 04/11/2020  ? Failure to thrive in adult 04/11/2020  ? Hypokalemia 04/11/2020  ? Dehydration 04/11/2020  ? Fall at home, initial encounter 04/11/2020  ? Shingles 04/11/2020  ? Subarachnoid hemorrhage ([place]) 07/03/2013  ? Depression 07/03/2013  ? COPD exacerbation ([place]) 07/03/2013  ? Syncope 07/02/2013  ? ?PCP:  [facility], [place]:   ?[id number] [place], [place] - [address] ?[id number]?Phone: [phone]: [phone]? ? ? ? ?Social Determinants of Health (SDOH) Interventions ?  ? ?Readmission Risk Interventions ?No flowsheet data found. ? ? ?

## 2021-03-19 NOTE — Assessment & Plan Note (Signed)
Avoid medications which could further prolong QT interval.  ?

## 2021-03-19 NOTE — Progress Notes (Addendum)
?Progress Note ? ? ?Patient: Jesse Nunez MAU:[id number] DOB: 02/17/1940 DOA: 03/18/2021     0 ?DOS: the patient was seen and examined on 03/19/2021 ?  ?Brief hospital course: ?81 year old past medical history significant for diabetes type 2, hypertension, OSA, hyperlipidemia, history of CVA who presents for evaluation of nausea vomiting and diarrhea for the last few days.  Patient has had poor oral intake over the last few days.  He has been more lethargic and less interactive for the last 2 days.  He has been having issues with nausea and vomiting for the last several months. ? ?He has been admitted with aspiration pneumonia, AKI, acute metabolic encephalopathy, recurrent nausea vomiting and diarrhea. ? ?Assessment and Plan: ?* Multifocal pneumonia ?Presents with vomiting, CT chest ; showed bilateral lower lobe opacity denser on the left consistent with multilobar pneumonia or aspiration.  He also presented with worsening altered mental status. ?-Continue with IV Unasyn. ?-Blood Culture no growth ?-He will need a speech and swallow eval ? ?Dehydration ?Received IV fluids.  ?He has mild metabolic acidosis.  ?Continue with IV fluids.  ?Poor oral intake, nausea and vomiting.  ? ?Diarrhea ?Check C. Diff ordered.  ? ?CKD (chronic kidney disease) stage 3, GFR 30-59 ml/min (HCC) ?Prior cr per records 1.4--1.6 ?CKD stage III a ?Admitted with dehydration.  ?continue with IV fluids.  ? ?Type 2 diabetes mellitus with hyperglycemia ([place]) ?Monitor blood sugar.  ?SSI.  ? ?Prolonged QT interval ?Avoid medications which could further prolong QT interval ? ?Nausea & vomiting ?He has been having issues with nausea vomiting for couple of month cyclic. ?CT abdomen and pelvis showed; fold thickening of the stomach consistent with gastritis, and gastric fluid distention which could be due to recent ingestion or impaired gastric emptying. ?Started on IV PPI ?Diet has been consulted. ? ? ?Acute metabolic encephalopathy ?Suspect in the setting  of infection, aspiration pneumonia, some underlying dementia.  He has been declining per nephew. ?Check ammonia, B12, vitamin D, TSH, RPR. ?Blood gas negative for hypercapnia mild hypoxemia.  He was placed on oxygen. ?If no significant improvement he may need an MRI of the brain. ? ? ? ? ? ?  ? ?Subjective: Per nephew patient was a sleeping and blood living a sedentary life and able to get out of the bed as they move him to his senior living facility in [place].  He has been having issues with chronic diarrhea and intermittent nausea and vomiting.  He has had issues with depression in the past.  He is currently not on any medication for depression. ? ?Physical Exam: ?Vitals:  ? 03/19/21 0448 03/19/21 0647 03/19/21 0929 03/19/21 1339  ?BP:  (!) 154/84 (!) 166/71 (!) 157/68  ?Pulse:  (!) 58 (!) 52 (!) 47  ?Resp:  '17 20 18  '$ ?Temp:  97.9 ?F (36.6 ?C) 97.7 ?F (36.5 ?C) 97.9 ?F (36.6 ?C)  ?TempSrc:  Oral Oral   ?SpO2:  94% 100% 100%  ?Weight: 75.5 kg     ?Height: '5\' 11"'$  (1.803 m)     ? ?General; Lethargic, open eyes.  ?Lung; BL air movement.  ?Abdomen: soft, nt, nd ? ?Data Reviewed: ? ?Cbc, bmet, ABG reviewed.  ? ?Family Communication: Nephew who is POA over phone ? ?Disposition: ?Status is: Inpatient ?Remains inpatient appropriate because: needs IV fluid, Antibiotics.  ? Planned Discharge Destination:  to be determine ? ? ? ?Time spent: 45 minutes ? ?Author: ?Elmarie Shiley, MD ?03/19/2021 2:30 PM ? ?For on call review [email or web address].  ?

## 2021-03-19 NOTE — H&P (Signed)
History and Physical    Patient: Jesse Nunez NWG:[id number] DOB: 10/13/1940 DOA: 03/18/2021 DOS: the patient was seen and examined on 03/19/2021 PCP: [facility] [place]  Patient coming from: ALF/ILF  Chief Complaint:  Chief Complaint  Patient presents with   Weakness   HPI: Jesse Nunez is a 81 y.o. male with medical history significant of DMT2, HTN, OSA, HLD, hx of CVA who presents for evaluation of nausea vomiting and diarrhea for the last few days.  Patiently his nephew was in the emergency room and gave history to the ER physician.  Nephew was concerned about dehydration after all the diarrhea and vomiting he had.  Reportedly has had decreased p.o. intake over the last few days.  He has become more lethargic and less active over the past 2 days.  Patient reports that he has had multiple episodes of nausea and vomiting and he has had diarrhea.  He has not noted any blood in the vomit or diarrhea.  He has not eaten much in the last few days.  He does report that when he does eat or drink he has coughing and gagging frequently.  States he has never been evaluated for aspiration.  He was treated for gout versus cellulitis on his right hand earlier in the week but for the last few days was unable to keep any antibiotic pills that he was given down.  He has not had any fever.  He does have some diffuse abdominal cramping over the last day or 2.  He denies any chest pain, cough, shortness of breath, congestion, syncope.  He denies urinary frequency or dysuria. In the emergency room he has been hemodynamically stable and he has not required any supplemental oxygen to maintain O2 sats.  His blood pressure has been normal and he has been hemodynamically stable. CT of his chest showed basilar pneumonia which could be due to aspiration.  He was given dose of vancomycin and Zosyn in the emergency room. He has not had any diarrhea or vomiting in the emergency room per RN.   Review of Systems: As mentioned  in the history of present illness. All other systems reviewed and are negative. Past Medical History:  Diagnosis Date   COPD (chronic obstructive pulmonary disease) ([place])    Depression    Diabetes ([place])    Family history of colon cancer    Hyperlipidemia    Hypertension    Macular degeneration disease    Shingles    Sleep apnea    Uses CPAP sometimes   Past Surgical History:  Procedure Laterality Date   APPENDECTOMY     COLONOSCOPY     COLONOSCOPY WITH PROPOFOL N/A 07/28/2020   Procedure: COLONOSCOPY WITH PROPOFOL;  Surgeon: Thornton Park, MD;  Location: [facility] ENDOSCOPY;  Service: Gastroenterology;  Laterality: N/A;   POLYPECTOMY  07/28/2020   Procedure: POLYPECTOMY;  Surgeon: Thornton Park, MD;  Location: [facility] ENDOSCOPY;  Service: Gastroenterology;;   TONSILLECTOMY     UPPER GASTROINTESTINAL ENDOSCOPY     Social History:  reports that he has quit smoking. He has never used smokeless tobacco. He reports that he does not drink alcohol and does not use drugs.  Allergies  Allergen Reactions   Glipizide Nausea And Vomiting    Pt reported it being a new prescription and took 1 dose for 01/05/20 and had several episodes of vomiting. Pt went to the ER.    Family History  Problem Relation Age of Onset   Colon cancer Mother  Hypertension Mother    Hypertension Father    Pancreatitis Father    Colon cancer Brother    Colon cancer Maternal Grandfather    Colon polyps Maternal Grandfather     Prior to Admission medications   Medication Sig Start Date End Date Taking? Authorizing Provider  albuterol (PROVENTIL HFA;VENTOLIN HFA) 108 (90 BASE) MCG/ACT inhaler Inhale 2 puffs into the lungs every 6 (six) hours as needed for wheezing or shortness of breath. 07/06/13   Kinnie Feil, MD  aspirin EC 81 MG tablet Take 81 mg by mouth daily. Swallow whole.    [provider]  atorvastatin (LIPITOR) 80 MG tablet Take 80 mg by mouth at bedtime. 03/21/17   [provider]   colestipol (COLESTID) 1 g tablet Take 1 g by mouth daily.    [provider]  ferrous sulfate 325 (65 FE) MG EC tablet Take 1 tablet (325 mg total) by mouth daily with breakfast. 07/06/13   Buriev, Arie Sabina, MD  fluticasone (FLONASE) 50 MCG/ACT nasal spray Place 2 sprays into both nostrils daily.    [provider]  lisinopril (ZESTRIL) 20 MG tablet Take 10 tablets by mouth daily. 03/21/17   [provider]  Multiple Vitamin (MULTIVITAMIN WITH MINERALS) TABS tablet Take 1 tablet by mouth daily.    [provider]  NUTRITIONAL SUPPLEMENTS PO Take 237 mLs by mouth daily as needed (sometimes).    [provider]  omeprazole (PRILOSEC) 20 MG capsule Take 40 mg by mouth daily. 03/21/17   [provider]  tamsulosin (FLOMAX) 0.4 MG CAPS capsule Take 0.4 mg by mouth at bedtime. 03/21/17   [provider]  zolpidem (AMBIEN) 5 MG tablet Take 1 tablet (5 mg total) by mouth at bedtime. 04/14/20   Alma Friendly, MD    Physical Exam: Vitals:   03/19/21 0200 03/19/21 0215 03/19/21 0230 03/19/21 0300  BP: 134/78  128/71 140/75  Pulse: 68 69 73 68  Resp: '16 13 15 19  '$ Temp:      TempSrc:      SpO2: 93% 94% 94% 94%  Weight:      Height:       General: WDWN, lethargic, Alert and oriented to person and place.  Eyes: EOMI, PERRL, conjunctivae normal.  Sclera nonicteric HENT:  [place]/AT, external ears normal.  Nares patent without epistasis.  Mucous membranes are dry Neck: Soft, normal range of motion, supple, no masses, Trachea midline Respiratory: Shallow respirations. Diminished breath sounds in bases. no wheezing, no crackles. Normal respiratory effort. Cardiovascular: Regular rate and rhythm, no murmurs / rubs / gallops. No extremity edema. 1+ pedal pulses. Abdomen: Soft, no tenderness, nondistended, no rebound or guarding. No masses palpated. Bowel sounds normoactive Musculoskeletal: FROM. no cyanosis. Normal muscle tone.  Skin: Warm, dry,  intact no rashes, No purpura Neurologic: CN 2-12 grossly intact. Opens eyes to voice and answers questions. Normal speech. Sensation intact, patella DTR +1 bilaterally.  Psychiatric: Tired. Normal mood.   Data Reviewed: Lab Work: CBC unremarkable.  Sodium 138 potassium 4.3 chloride 103 bicarb 23 creatinine 1.53 BUN 31 glucose 231 LFTs normal.  Lactic acid 2.0 initially.  Repeat lactic acid 2.3.  Urinalysis negative except for moderate protein.   COVID pending  EKG shows normal sinus rhythm with no acute ST elevation.  Does have mild diffuse ST depression that is nonspecific.  QTc mildly prolonged at 473  CT of the head shows no acute intracranial pathology.  He has chronic atrophy and chronic  small vessel ischemic changes and remote infarcts since April 2022  CT abdomen pelvis with contrast shows bilateral lower lobe opacities which are denser on the left and consistent with multilobar pneumonia versus aspiration.  No acute pathology of the abdomen or pelvis.  Cholelithiasis present.  Bladder distended without thickening.  Since CT was performed Foley catheter has been placed.  Assessment and Plan: * Multifocal pneumonia Mr. Corsino is admitted to Med Surg floor He has never been in critical condition as has been hemodynamically stable since presentation and is not requiring supplemental oxygen. Is breathing comfortably on room air. He does not have sepsis as no hypotension, leukocytosis, tachycardia, tachypnea present and is afebrile. Was given dose of unasyn and vancomycin in the ER. Pt has suspected aspiration pneumonia by CT chest findings and he reports he does have gagging and coughing when he eats and swallows. Will change antibiotic to unasyn to cover for aspiration.  Obtain speech therapy evaluation. Pt states he has never had swallow evaluation  Check CBC in am  Dehydration IVF hydration. Was given bolus in the ER. Will continue IVF hydration  Monitor renal function and  electrolytes  Diarrhea Check C. Difficile with dehydration in light of diarrhea. Will treat if positive  CKD (chronic kidney disease) stage 3, GFR 30-59 ml/min (HCC) Stable. Monitor renal function  Type 2 diabetes mellitus with hyperglycemia (HCC) Monitor blood sugar. Provide insulin as needed for glycemic control.  Check HgbA1c level  Prolonged QT interval Avoid medications which could further prolong QT interval  Advance Care Planning:   Code Status:  Full.   Lovenox for DVT prophylaxis  Family Communication: No family at bedside.  Discussed diagnosis and plan with patient and he agrees to be admitted.  Further recommendations to follow as clinical indicated  Author: Eben Burow, MD 03/19/2021 4:15 AM  For on call review [email or web address].

## 2021-03-19 NOTE — Assessment & Plan Note (Addendum)
Metabolic acidosis resolved with IV fluid hydration.  Continue encourage increase oral intake. ?

## 2021-03-20 ENCOUNTER — Inpatient Hospital Stay (HOSPITAL_COMMUNITY): Payer: Medicare Other | Admitting: Certified Registered Nurse Anesthetist

## 2021-03-20 ENCOUNTER — Encounter (HOSPITAL_COMMUNITY)
Admission: EM | Disposition: A | Discharge status: Skilled Nursing Facility | Payer: Self-pay | Source: Home / Self Care | Attending: Internal Medicine

## 2021-03-20 DIAGNOSIS — E559 Vitamin D deficiency, unspecified: Secondary | ICD-10-CM | POA: Diagnosis present

## 2021-03-20 DIAGNOSIS — I10 Essential (primary) hypertension: Secondary | ICD-10-CM | POA: Diagnosis present

## 2021-03-20 DIAGNOSIS — E1122 Type 2 diabetes mellitus with diabetic chronic kidney disease: Secondary | ICD-10-CM

## 2021-03-20 DIAGNOSIS — K2970 Gastritis, unspecified, without bleeding: Secondary | ICD-10-CM

## 2021-03-20 DIAGNOSIS — I129 Hypertensive chronic kidney disease with stage 1 through stage 4 chronic kidney disease, or unspecified chronic kidney disease: Secondary | ICD-10-CM

## 2021-03-20 DIAGNOSIS — K449 Diaphragmatic hernia without obstruction or gangrene: Secondary | ICD-10-CM

## 2021-03-20 DIAGNOSIS — N183 Chronic kidney disease, stage 3 unspecified: Secondary | ICD-10-CM

## 2021-03-20 HISTORY — PX: ESOPHAGOGASTRODUODENOSCOPY (EGD) WITH PROPOFOL: SHX5813

## 2021-03-20 HISTORY — PX: BIOPSY: SHX5522

## 2021-03-20 LAB — BASIC METABOLIC PANEL
Anion gap: 10 (ref 5–15)
BUN: 19 mg/dL (ref 8–23)
CO2: 22 mmol/L (ref 22–32)
Calcium: 8.7 mg/dL — ABNORMAL LOW (ref 8.9–10.3)
Chloride: 106 mmol/L (ref 98–111)
Creatinine, Ser: 1.29 mg/dL — ABNORMAL HIGH (ref 0.61–1.24)
GFR, Estimated: 56 mL/min — ABNORMAL LOW (ref >60)
Glucose, Bld: 105 mg/dL — ABNORMAL HIGH (ref 70–99)
Potassium: 4 mmol/L (ref 3.5–5.1)
Sodium: 138 mmol/L (ref 135–145)

## 2021-03-20 LAB — URINE CULTURE: Culture: NO GROWTH

## 2021-03-20 LAB — CBC
HCT: 37.7 % — ABNORMAL LOW (ref 39.0–52.0)
Hemoglobin: 12 g/dL — ABNORMAL LOW (ref 13.0–17.0)
MCH: 29.7 pg (ref 26.0–34.0)
MCHC: 31.8 g/dL (ref 30.0–36.0)
MCV: 93.3 fL (ref 80.0–100.0)
Platelets: 143 10*3/uL — ABNORMAL LOW (ref 150–400)
RBC: 4.04 MIL/uL — ABNORMAL LOW (ref 4.22–5.81)
RDW: 12.9 % (ref 11.5–15.5)
WBC: 10.1 10*3/uL (ref 4.0–10.5)
nRBC: 0 % (ref 0.0–0.2)

## 2021-03-20 LAB — GLUCOSE, CAPILLARY
Glucose-Capillary: 101 mg/dL — ABNORMAL HIGH (ref 70–99)
Glucose-Capillary: 103 mg/dL — ABNORMAL HIGH (ref 70–99)
Glucose-Capillary: 115 mg/dL — ABNORMAL HIGH (ref 70–99)
Glucose-Capillary: 116 mg/dL — ABNORMAL HIGH (ref 70–99)
Glucose-Capillary: 124 mg/dL — ABNORMAL HIGH (ref 70–99)
Glucose-Capillary: 141 mg/dL — ABNORMAL HIGH (ref 70–99)

## 2021-03-20 LAB — RPR: RPR Ser Ql: NONREACTIVE

## 2021-03-20 LAB — MAGNESIUM: Magnesium: 1.9 mg/dL (ref 1.7–2.4)

## 2021-03-20 SURGERY — ESOPHAGOGASTRODUODENOSCOPY (EGD) WITH PROPOFOL
Anesthesia: Monitor Anesthesia Care

## 2021-03-20 MED ORDER — PROPOFOL 500 MG/50ML IV EMUL
INTRAVENOUS | Status: DC | PRN
  Administered 2021-03-20 (×3): 20 mg via INTRAVENOUS

## 2021-03-20 MED ORDER — HYDRALAZINE HCL 25 MG PO TABS
25.0000 mg | ORAL_TABLET | Freq: Three times a day (TID) | ORAL | Status: DC
  Administered 2021-03-20 – 2021-03-21 (×2): 25 mg via ORAL
  Filled 2021-03-20 (×2): qty 1

## 2021-03-20 MED ORDER — LACTATED RINGERS IV SOLN: INTRAVENOUS | Status: DC | PRN

## 2021-03-20 MED ORDER — ONDANSETRON HCL 4 MG PO TABS
4.0000 mg | ORAL_TABLET | Freq: Every day | ORAL | Status: DC
  Administered 2021-03-20 – 2021-03-21 (×2): 4 mg via ORAL
  Filled 2021-03-20 (×2): qty 1

## 2021-03-20 MED ORDER — DEXTROSE IN LACTATED RINGERS 5 % IV SOLN
INTRAVENOUS | Status: DC
  Filled 2021-03-20: qty 1000

## 2021-03-20 MED ORDER — HYDRALAZINE HCL 20 MG/ML IJ SOLN
10.0000 mg | Freq: Four times a day (QID) | INTRAMUSCULAR | Status: DC | PRN
  Administered 2021-03-21: 10 mg via INTRAVENOUS
  Filled 2021-03-20: qty 1

## 2021-03-20 MED ORDER — VITAMIN D (ERGOCALCIFEROL) 1.25 MG (50000 UNIT) PO CAPS
50000.0000 [IU] | ORAL_CAPSULE | ORAL | Status: DC
  Administered 2021-03-20: 50000 [IU] via ORAL
  Filled 2021-03-20: qty 1

## 2021-03-20 SURGICAL SUPPLY — 15 items

## 2021-03-20 NOTE — TOC Progression Note (Signed)
Transition of Care (TOC) - Progression Note  ? ?Patient Details  ?Name: Jesse Nunez ?MRN: [id number] ?Date of Birth: [birth year] ? ?Transition of Care (TOC) CM/SW Contact  ?Sherie Don, LCSW ?Phone Number: ?03/20/2021, 2:18 PM ? ?Clinical Narrative: PT evaluation recommended SNF. CSW spoke with patient's nephew, Alla German, to discuss recommendations. Initially, nephew was interested in having the patient return to his ILF with [facility] and private duty care, but now thinks short-term rehab would be beneficial. FL2 done; PASRR verified. Initial referral faxed out. TOC awaiting bed offers. ? ?Expected Discharge Plan:  (returh to [place] apt vs possible SNF) ?Barriers to Discharge: Continued Medical Work up ? ?Expected Discharge Plan and Services ?Expected Discharge Plan:  (returh to [place] apt vs possible SNF) ?In-house Referral: Clinical Social Work ?Living arrangements for the past 2 months: [facility]) ? ?Readmission Risk Interventions ?No flowsheet data found. ? ?

## 2021-03-20 NOTE — Progress Notes (Signed)
Physical Therapy Treatment ?Patient Details ?Name: Jesse Nunez ?MRN: [id number] ?DOB: Nov 17, 1940 ?Today's Date: 03/20/2021 ? ? ?History of Present Illness Pt is an 81 y.o. male presenting with nausea vomiting and diarrhea for the last few days admitted with aspiration pneumonia, AKI, acute metabolic encephalopathy. PMH significant for diabetes type 2, hypertension, OSA, hyperlipidemia, history of CVA, underlying dementia. ? ?  ?PT Comments  ? ? Therapist returning back to pt room to assist pt back to bed to be transported down for procedure after transferring to recliner during initial evaluation. MOD-MAX A +2 to transfer from recliner back to EOB (recliner placed slightly further away from bed compared to 1st session). Pt still with heavy L lateral lean and required intermittent manual assist for RW management and progressing both lower extremities, more difficulty with R LE compared to L. BP elevated reading 199/34mHg following transfers back to bed, RN notified. Pt will benefit from continued skilled PT to increase their independence and maximize safety with mobility.  ?    ?Recommendations for follow up therapy are one component of a multi-disciplinary discharge planning process, led by the attending physician.  Recommendations may be updated based on patient status, additional functional criteria and insurance authorization. ? ?Follow Up Recommendations ? Skilled nursing-short term rehab (<3 hours/day) ?  ?  ?Assistance Recommended at Discharge Frequent or constant Supervision/Assistance  ?Patient can return home with the following Two people to help with walking and/or transfers;Two people to help with bathing/dressing/bathroom;Assistance with cooking/housework;Direct supervision/assist for medications management;Direct supervision/assist for financial management;Assist for transportation;Help with stairs or ramp for entrance ?  ?Equipment Recommendations ? Rolling walker (2 wheels) (may be updated pending pt  progress)  ?  ?Recommendations for Other Services   ? ? ?  ?Precautions / Restrictions Precautions ?Precautions: Fall ?Restrictions ?Weight Bearing Restrictions: No  ?  ? ?Mobility ? Bed Mobility ?Overal bed mobility: Needs Assistance ?Bed Mobility: Sit to Supine ?  ?  ?Supine to sit: Min assist, HOB elevated ?Sit to supine: Min assist, HOB elevated ?  ?General bed mobility comments: OOB in recliner upon entry. MIN A for trunk control and increased time and cuing required to bring LEs onto bed. Assist to slide shoulders laterally in bed to reposition, pt with L leaning ?  ? ?Transfers ?Overall transfer level: Needs assistance ?Equipment used: Rolling walker (2 wheels) ?Transfers: Sit to/from Stand, Bed to chair/wheelchair/BSC ?Sit to Stand: Mod assist, +2 physical assistance, +2 safety/equipment, From elevated surface ?  ?Step pivot transfers: Mod assist, +2 physical assistance, +2 safety/equipment, Max assist ?  ?  ?  ?General transfer comment: MOD-MAX A+2 for power up to stand. Cues for hand placement and ultimately required hand overhand for proper placement. MOD -MAX A+2 to take steps back toward bed (recliner chair moved few ft further to assess any changes with coming back to bed vs getting to chair earlier).  pt still with significant L lateral leaning requirign facilitationf rom therapist for weight shift to middle and for offloading when attempting to take steps. narrow BOS and intermittent manual assist to progress LEs, more so R LE, pt sliding/shuffling both legs to get back to bed. Attempted standing marches, but pt unable to clear either LE from floor. Vitals taken while in supine following transfer from chair to bed 199/86, 46bpm, 93% on RA. ?  ? ?Ambulation/Gait ?  ?  ?  ?  ?  ?  ?  ?  ? ? ?Stairs ?  ?  ?  ?  ?  ? ? ?  Wheelchair Mobility ?  ? ?Modified Rankin (Stroke Patients Only) ?  ? ? ?  ?Balance Overall balance assessment: Needs assistance ?Sitting-balance support: Feet supported, Bilateral upper  extremity supported ?Sitting balance-Leahy Scale: Poor ?  ?Postural control: Left lateral lean ?Standing balance support: Bilateral upper extremity supported, During functional activity, Reliant on assistive device for balance ?Standing balance-Leahy Scale: Poor ?Standing balance comment: reliant on external support ?  ?  ?  ?  ?  ?  ?  ?  ?  ?  ?  ?  ? ?  ?Cognition Arousal/Alertness: Awake/alert ?Behavior During Therapy: Flat affect ?Overall Cognitive Status: History of cognitive impairments - at baseline ?  ?  ?  ?  ?  ?  ?  ?  ?  ?  ?  ?  ?  ?  ?  ?  ?General Comments: Caregiver reports she has noticed that pt's cognition/memory comes and goes. Pt oriented to self, and place, unable to verbalize correct yr stating it is "2021" therapist reorienting pt, but pt still unable to verbalize correct yr despite cues. Cues to state correct month, initially saying it is february. ?  ?  ? ?  ?Exercises   ? ?  ?General Comments   ?  ?  ? ?Pertinent Vitals/Pain Pain Assessment ?Pain Assessment: No/denies pain  ? ? ?Home Living Family/patient expects to be discharged to:: Other (Comment) (Independent living) ?  ?  ?  ?  ?  ?  ?  ?  ?  ?Additional Comments: has rollator uses at baseline, has toilet riser, shower chair, walk-in shower (does shower and sponge baths with assist).  ?  ?Prior Function    ?  ?  ?   ? ?PT Goals (current goals can now be found in the care plan section) Acute Rehab PT Goals ?Patient Stated Goal: none stated by pt- Caregiver reports goals of being able to go back to independent living. Reports that family would also like that, no family present to confirm. ?PT Goal Formulation:  (with caregiver) ?Time For Goal Achievement: 04/03/21 ?Potential to Achieve Goals: Fair ?Progress towards PT goals: Progressing toward goals ? ?  ?Frequency ? ? ? Min 3X/week ? ? ? ?  ?PT Plan Current plan remains appropriate  ? ? ?Co-evaluation   ?  ?  ?  ?  ? ?  ?AM-PAC PT "6 Clicks" Mobility   ?Outcome Measure ? Help needed  turning from your back to your side while in a flat bed without using bedrails?: A Little ?Help needed moving from lying on your back to sitting on the side of a flat bed without using bedrails?: A Lot ?Help needed moving to and from a bed to a chair (including a wheelchair)?: Total ?Help needed standing up from a chair using your arms (e.g., wheelchair or bedside chair)?: Total ?Help needed to walk in hospital room?: Total ?Help needed climbing 3-5 steps with a railing? : Total ?6 Click Score: 9 ? ?  ?End of Session Equipment Utilized During Treatment: Gait belt ?Activity Tolerance: Patient tolerated treatment well ?Patient left: in bed;with nursing/sitter in room (pt being transported down for procedure) ?Nurse Communication: Mobility status ?PT Visit Diagnosis: Unsteadiness on feet (R26.81);Muscle weakness (generalized) (M62.81);Difficulty in walking, not elsewhere classified (R26.2);Other abnormalities of gait and mobility (R26.89) ?  ? ? ?Time: 1206-1227 ?PT Time Calculation (min) (ACUTE ONLY): 21 min ? ?Charges:  $Therapeutic Activity: 8-22 mins          ?          ? ?  Festus Barren., PT, DPT  ?Acute Rehabilitation Services  ?Office [phone] ? ?03/20/2021, 2:43 PM ? ?

## 2021-03-20 NOTE — Progress Notes (Signed)
SLP Cancellation Note ? ?Patient Details ?Name: Jesse Nunez ?MRN: [id number] ?DOB: Apr 29, 1940 ? ? ?Cancelled treatment:       Reason Eval/Treat Not Completed: Medical issues which prohibited therapy. Per RN, pt is more alert today but is also having EGD. Will hold POs pending procedure and f/u as able.  ? ? ? ?Osie Bond., M.A. CCC-SLP ?Acute Rehabilitation Services ?Pager [phone] ?Office [phone] ? ?03/20/2021, 11:32 AM ?

## 2021-03-20 NOTE — Transfer of Care (Signed)
Immediate Anesthesia Transfer of Care Note ? ?Patient: Jesse Nunez ? ?Procedure(s) Performed: Procedure(s): ?ESOPHAGOGASTRODUODENOSCOPY (EGD) WITH PROPOFOL (N/A) ?BIOPSY ? ?Patient Location: PACU and Endoscopy Unit ? ?Anesthesia Type:MAC ? ?Level of Consciousness: awake, alert  and oriented ? ?Airway & Oxygen Therapy: Patient Spontanous Breathing and Patient connected to nasal cannula oxygen ? ?Post-op Assessment: Report given to RN and Post -op Vital signs reviewed and stable ? ?Post vital signs: Reviewed and stable ? ?Last Vitals:  ?Vitals:  ? 03/20/21 0533 03/20/21 1242  ?BP: (!) 163/73 (!) 200/67  ?Pulse: (!) 102 (!) 54  ?Resp: 18 18  ?Temp: 36.6 ?C (!) 36.4 ?C  ?SpO2: 100% 96%  ? ? ?Complications: No apparent anesthesia complications ? ?

## 2021-03-20 NOTE — Progress Notes (Addendum)
?Progress Note ? ? ?Patient: Jesse Nunez NWG:[id number] DOB: 1940-12-17 DOA: 03/18/2021     1 ?DOS: the patient was seen and examined on 03/20/2021 ?  ?Brief hospital course: ?81 year old past medical history significant for diabetes type 2, hypertension, OSA, hyperlipidemia, history of CVA who presents for evaluation of nausea vomiting and diarrhea for the last few days.  Patient has had poor oral intake over the last few days.  He has been more lethargic and less interactive for the last 2 days.  He has been having issues with nausea and vomiting for the last several months. ? ?He has been admitted with aspiration pneumonia, acute metabolic encephalopathy, recurrent nausea vomiting and diarrhea. ? ?GI has been consulted, plan is for Endoscopy today.  ? ?Assessment and Plan: ?* Multifocal pneumonia ?Presents with vomiting, CT chest ; showed bilateral lower lobe opacity denser on the left consistent with multilobar pneumonia or aspiration.  He also presented with worsening altered mental status. ?-Continue with IV Unasyn. ?-Blood Culture no growth ?-He will need a speech and swallow eval ? ?Dehydration ?Received IV fluids.  ?Metabolic acidosis resolved with fluids.   ?Continue with IV fluids.  ?Related to Poor oral intake, nausea and vomiting.  ? ?Diarrhea ?C. Diff ordered. Not send , no further diarrhea.  ? ?CKD (chronic kidney disease) stage 3, GFR 30-59 ml/min (HCC) ?Prior cr per records 1.4--1.6 ?CKD stage III a ?Admitted with dehydration.  ?Continue with IV fluids.  ?Cr down to 1.29. improved.  ? ?Type 2 diabetes mellitus with hyperglycemia ([place]) ?Monitor blood sugar.  ?SSI.  ? ?Prolonged QT interval ?Avoid medications which could further prolong QT interval ? ?Vitamin D deficiency ?Start oral supplement.  ? ?Essential hypertension ?BP elevated, added PRN Hydralazine.  ?Will schedule oral Hydralazine.  ? ?Nausea & vomiting ?He has been having issues with nausea, vomiting for couple of month cyclic. ?CT abdomen and  pelvis showed; fold thickening of the stomach consistent with gastritis, and gastric fluid distention which could be due to recent ingestion or impaired gastric emptying. ?Continue with  IV PPI ?GI consulted. Plan for endoscopy today  ? ? ? ?Acute metabolic encephalopathy ?Suspect in the setting of infection, aspiration pneumonia, some underlying dementia.  He has been declining per nephew over last few months.  ?Ammonia: 12, B:12: 273, vitamin D 23, TSH 0.75 , RPR Non reactive. [place] Kitchen ?Blood gas negative for hypercapnia mild hypoxemia.  He was placed on oxygen. ?-He is more alert today, said few words.  ?-Started on B 12 , vitamin D supplement.  ?-started on IV thiamine high dose for 3 days.  ? ? ? ? ? ?  ? ?Subjective: he is alert today. He said few words. He denies pain.  ? ?Physical Exam: ?Vitals:  ? 03/19/21 2043 03/20/21 0023 03/20/21 0533 03/20/21 1242  ?BP:  (!) 144/118 (!) 163/73 (!) 200/67  ?Pulse: (!) 48 (!) 54 (!) 102 (!) 54  ?Resp: '18 18 18 18  '$ ?Temp:  98.7 ?F (37.1 ?C) 97.8 ?F (36.6 ?C) (!) 97.5 ?F (36.4 ?C)  ?TempSrc:  Oral Oral Temporal  ?SpO2: 99% 100% 100% 96%  ?Weight:      ?Height:    '5\' 11"'$  (1.803 m)  ? ?General; He is more alert today.  ?CVS; S 1, S2 RRR ?Lungs; CTA ?Abdomen; soft, nt. Nd.  ? ?Data Reviewed: ? ?B 12, Vitamin D, Bmet,  ? ?Family Communication: care discussed with nephew 3/09 ? ?Disposition: ?Status is: Inpatient ?Remains inpatient appropriate because: admitted for PNA, Nausea,  vomiting.  ? Planned Discharge Destination: to be determine, PT consulted.  ? ? ? ?Time spent: 45 minutes ? ?Author: ?Elmarie Shiley, MD ?03/20/2021 12:55 PM ? ?For on call review [email or web address].  ?

## 2021-03-20 NOTE — Assessment & Plan Note (Addendum)
Lisinopril 10 mg p.o. daily ?

## 2021-03-20 NOTE — Progress Notes (Signed)
Initial Nutrition Assessment ? ?DOCUMENTATION CODES:  ? ?Not applicable ? ?INTERVENTION:  ?- diet advancement as medically feasible. ?- complete NFPE when feasible.  ? ? ?NUTRITION DIAGNOSIS:  ? ?Increased nutrient needs related to acute illness as evidenced by estimated needs. ? ?GOAL:  ? ?Patient will meet greater than or equal to 90% of their needs ? ?MONITOR:  ? ?Diet advancement, Labs, Weight trends ? ?REASON FOR ASSESSMENT:  ? ?Malnutrition Screening Tool ? ?ASSESSMENT:  ? ?81 year old past medical history of type 2 DM, HTN, OSA, HLD, COPD, depression, macular degeneration, and CVA. He presented to the ED due to N/V/D and poor PO intake for several days, less interactive for 2 days. He is noted to have had N/V for several months. patient admitted with aspiration PNA, acute metabolic encephalopathy, and recurrent N/V/D. GI consulted. ? ?Patient is out of the room to Endoscopy for EGD and no visitors present in the room at time of attempted visit. Patient is noted to be a/o to self only.  ? ?He has been NPO since admission. SLP unable to assess patient yesterday d/t him not being sufficiently alert to safely trial PO and unable to see him today d/t EGD and need to be NPO for this procedure.  ? ?He has not been seen by a [employer] RD at any time in the past.  ? ?Weight yesterday was 166 lb and PTA the most recently documented weight was 175 lb on 07/28/20. This indicates 9 lb weight loss (5% body weight) in the past 8 months); not significant for time frame. No information documented in the edema section of flow sheet.  ? ?Per notes: ?- multifocal PNA  ?- worsening mental status/acute metabolic encephalopathy ?- dehydration on admission ?- diarrhea--resolved, Cdiff check ordered but canceled  ?- stage 3 CKD ?- vitamin D deficiency--supplementation started ?- CT abdomen/pelvis findings consistent with gastritis and gastric fluid distention  ? ? ? ?Labs reviewed; CBGs: 101, 103, 115 mg/dl, creatinine: 1.29 mg/dl,  Ca: 8.7 mg/dl, GFR: 56 ml/min. ? ?Medications reviewed; sliding scale novolog, 40 mg IV protonix BID, 500 mg IV thiamine x1 dose/day (3/9-3/11), 50000 units ergocalciferol every 7 days starting 3/10. ? ?IVF; D5-LR @ 75 ml/hr (306 kcal/24 hrs). ? ? ? ?NUTRITION - FOCUSED PHYSICAL EXAM: ? ?Patient out of the room to Endoscopy for EGD. ? ?Diet Order:   ?Diet Order   ? ?       ?  Diet NPO time specified Except for: Sips with Meds  Diet effective 0500       ?  ? ?  ?  ? ?  ? ? ?EDUCATION NEEDS:  ? ?Not appropriate for education at this time ? ?Skin:  Skin Assessment: Reviewed RN Assessment ? ?Last BM:  PTA/unknown ? ?Height:  ? ?Ht Readings from Last 1 Encounters:  ?03/20/21 '5\' 11"'$  (1.803 m)  ? ? ?Weight:  ? ?Wt Readings from Last 1 Encounters:  ?03/19/21 75.5 kg  ? ? ? ?BMI:  Body mass index is 23.21 kg/m?. ? ?Estimated Nutritional Needs:  ?Kcal:  1900-2100 kcal ?Protein:  95-105 grams ?Fluid:  >/= 2.1 L/day ? ? ? ? ?Jarome Matin, MS, RD, LDN ?Inpatient Clinical Dietitian ?RD pager # available in [place]  ?After hours/weekend pager # available in [place] ? ?

## 2021-03-20 NOTE — Evaluation (Signed)
Physical Therapy Evaluation Patient Details Name: Jesse Nunez MRN: [id number] DOB: 1940-04-29 Today's Date: 03/20/2021  History of Present Illness  Pt is an 81 y.o. male presenting with nausea vomiting and diarrhea for the last few days admitted with aspiration pneumonia, AKI, acute metabolic encephalopathy. PMH significant for diabetes type 2, hypertension, OSA, hyperlipidemia, history of CVA, underlying dementia.   Clinical Impression  Pt is an 81 y.o male with above HPI resulting in the deficits listed below (see PT Problem List). Per caregiver, pt is typically able to perform transfers with +2 assist, ambulate with use of rollator and assist/supervision for safety and able to go to bathroom on his own when caregiver is not present (most recently has been using briefs due to issues with diarrhea.) Upon eval, pt required MOD A for bed mobility demonstrated L lateral lean requiring external or B UE support to maintain upright seated balance. Increased time and repeated multimodal cues for performance and safety with all mobility. Pt performed sit to stand transfers requiring MOD A+2 and cues for safe hand placement and required MOD A+2 for stability with taking steps to recliner chair, pt with heavy L lateral lean in standing and cues required for sequencing and manual assist with RW. Pt has limited assist from caregiver 3hrs/day during the week and caregiver states that family visits patient as well. Recommend SNF for short term rehab stay prior to return to independent living due to decreased caregiver support at home at this time and increased +2 assistance required for performance ane safety with all OOB mobility. Pt will benefit from skilled PT to maximize functional mobility to increase independence.         Recommendations for follow up therapy are one component of a multi-disciplinary discharge planning process, led by the attending physician.  Recommendations may be updated based on patient  status, additional functional criteria and insurance authorization.  Follow Up Recommendations Skilled nursing-short term rehab (<3 hours/day)    Assistance Recommended at Discharge Frequent or constant Supervision/Assistance  Patient can return home with the following  Two people to help with walking and/or transfers;Two people to help with bathing/dressing/bathroom;Assistance with cooking/housework;Direct supervision/assist for medications management;Direct supervision/assist for financial management;Assist for transportation;Help with stairs or ramp for entrance    Equipment Recommendations Rolling walker (2 wheels) (may be updated pending pt progress)  Recommendations for Other Services       Functional Status Assessment Patient has had a recent decline in their functional status and demonstrates the ability to make significant improvements in function in a reasonable and predictable amount of time.     Precautions / Restrictions Precautions Precautions: Fall Restrictions Weight Bearing Restrictions: No      Mobility  Bed Mobility Overal bed mobility: Needs Assistance Bed Mobility: Supine to Sit     Supine to sit: Min assist, HOB elevated     General bed mobility comments: MIN A to bring trunk to upright and cues for hand placement, increased time to progress LEs to EOB. Pt with L lateral lean while seated, initally requiring MIN-MOD A for seated balance, improved to close supervision but requiring cues for midline posture, pt with use of B UEs for seated balance and able to initiate some correction from L lateral leaning to midline, but not fully without assist.    Transfers Overall transfer level: Needs assistance Equipment used: Rolling walker (2 wheels) Transfers: Sit to/from Stand, Bed to chair/wheelchair/BSC Sit to Stand: Mod assist, +2 physical assistance, +2 safety/equipment, From elevated surface  Step pivot transfers: Mod assist, +2 physical assistance, +2  safety/equipment       General transfer comment: MOD A+2 for power up to stand, pt initally stating "i can't" when cued to stand from EOB. Cues for hand placement and increased time to position B LEs in prep for transfer. Required repeated verbal cues for initiation of stand. MOD A+2 to take steps laterally to recliner chair, pt with significant L lateral leaning initially able to take ~3 steps and then with narrow BOS requiring intermittent facilitation for weight shifting and manual assist to progress LEs, more so R LE. continued heavy L lean even when seated in chair, positioned with pillows to bring to midline. Vitals taken at following transfer to chair 189/69, HR 54, O2 sats 92%, RN updated and in at end of session. Vitals end of session 180/76, 52bpm, O2 94%.    Ambulation/Gait                  Stairs            Wheelchair Mobility    Modified Rankin (Stroke Patients Only)       Balance Overall balance assessment: Needs assistance Sitting-balance support: Feet supported, Bilateral upper extremity supported Sitting balance-Leahy Scale: Poor   Postural control: Left lateral lean Standing balance support: Bilateral upper extremity supported, During functional activity, Reliant on assistive device for balance Standing balance-Leahy Scale: Poor Standing balance comment: reliant on external support                             Pertinent Vitals/Pain Pain Assessment Pain Assessment: No/denies pain    Home Living Family/patient expects to be discharged to:: Other (Comment) (Independent living)                   Additional Comments: has rollator uses at baseline, has toilet riser, shower chair, walk-in shower (does shower and sponge baths with assist).    Prior Function Prior Level of Function : Needs assist       Physical Assist : ADLs (physical);Mobility (physical) Mobility (physical): Transfers;Gait;Bed mobility ADLs (physical):  Bathing;Dressing;IADLs;Grooming Mobility Comments: Assist with bed mobility, transfers, toileting (able to perform independently when no one there to assist per caregiver) ADLs Comments: Private duty agency mon-fri 3hrs/day for past month who assists with ADLs and mobility- staying in routine. Caregiver reports that her and family may potentially be looking into increasing hours.     Hand Dominance        Extremity/Trunk Assessment   Upper Extremity Assessment Upper Extremity Assessment: RUE deficits/detail;LUE deficits/detail RUE Deficits / Details: Pt dysmetric with finger to nose bilaterally, and decreased velocity when returning to touch his nose. caregiver states pt typically wears glasses. LUE Deficits / Details: Pt required increased assist to raise L UE into shoulder flexion, multiple times only performing elbow flex/ext despite verbal and visual cuing, good grip strength bilaterally.    Lower Extremity Assessment Lower Extremity Assessment: Generalized weakness    Cervical / Trunk Assessment Cervical / Trunk Assessment: Normal  Communication   Communication: No difficulties  Cognition Arousal/Alertness: Lethargic Behavior During Therapy: Flat affect Overall Cognitive Status: History of cognitive impairments - at baseline                                 General Comments: Caregiver reports she has noticed that pt's cognition/memory comes and goes. Pt  oriented to self, and place, unable to verbalize correct yr stating it is "2021" therapist reorienting pt, but pt still unable to verbalize correct yr despite cues. Cues to state correct month, initially saying it is february.        General Comments      Exercises     Assessment/Plan    PT Assessment Patient needs continued PT services  PT Problem List Decreased strength;Decreased range of motion;Decreased activity tolerance;Decreased balance;Decreased mobility;Decreased coordination;Decreased knowledge of  use of DME       PT Treatment Interventions DME instruction;Gait training;Stair training;Functional mobility training;Therapeutic activities;Therapeutic exercise;Balance training;Neuromuscular re-education;Patient/family education    PT Goals (Current goals can be found in the Care Plan section)  Acute Rehab PT Goals Patient Stated Goal: none stated by pt- Caregiver reports goals of being able to go back to independent living. Reports that family would also like that, no family present to confirm. PT Goal Formulation:  (with caregiver) Time For Goal Achievement: 04/03/21 Potential to Achieve Goals: Fair    Frequency Min 3X/week     Co-evaluation               AM-PAC PT "6 Clicks" Mobility  Outcome Measure Help needed turning from your back to your side while in a flat bed without using bedrails?: A Little Help needed moving from lying on your back to sitting on the side of a flat bed without using bedrails?: A Lot Help needed moving to and from a bed to a chair (including a wheelchair)?: Total Help needed standing up from a chair using your arms (e.g., wheelchair or bedside chair)?: Total Help needed to walk in hospital room?: Total Help needed climbing 3-5 steps with a railing? : Total 6 Click Score: 9    End of Session Equipment Utilized During Treatment: Gait belt Activity Tolerance: Patient tolerated treatment well;Patient limited by fatigue Patient left: in chair;with call bell/phone within reach;with family/visitor present Nurse Communication: Mobility status PT Visit Diagnosis: Unsteadiness on feet (R26.81);Muscle weakness (generalized) (M62.81);Difficulty in walking, not elsewhere classified (R26.2);Other abnormalities of gait and mobility (R26.89)    Time: 7371-0626 PT Time Calculation (min) (ACUTE ONLY): 43 min   Charges:   PT Evaluation $PT Eval Low Complexity: 1 Low PT Treatments $Therapeutic Activity: 23-37 mins        Festus Barren PT, DPT  Acute  Rehabilitation Services  Office [phone]  03/20/2021, 12:03 PM

## 2021-03-20 NOTE — Progress Notes (Signed)
Occupational Therapy Evaluation   Patient resides at independent living apartment and has a caregiver for 3 hours daily. Caregiver will assist with getting patient out of bed, to bathroom and supervision for ambulating with walker, however can perform these ADLs on his own when he is alone. Info obtained from PT eval as patient's caregiver not present during OT evaluation. Currently patient presenting with significant balance deficits and what appears as right inattention. When seated edge of bed patient leaning posteriorly and to his left with right arm hanging off the side of bed. OT brings attention to this however patient has max difficulty motor planning and placing R arm onto bed without assist. Patient also having difficulty following directions for MMT/ROM testing as well as maintaining balance while at edge of bed. OT providing mod to max A to correct posture with multimodal cues for lower and upper extremity placement however after few moments patient decompensates and continues to lean back and to L side. Patient trying to lay himself down and states he is dizzy needing max A back to bed. BP taken once semi-supine elevated at 180/70, patient also nauseous and gagging with RN made aware. Patient will need continued therapy services at D/C due to need for +2 assistance and significant help with ADL tasks, recommend short term rehab. Acute OT to follow.     03/20/21 1300  OT Visit Information  Last OT Received On 03/20/21  Assistance Needed +2  History of Present Illness Pt is an 81 y.o. male presenting with nausea vomiting and diarrhea for the last few days admitted with aspiration pneumonia, AKI, acute metabolic encephalopathy. PMH significant for diabetes type 2, hypertension, OSA, hyperlipidemia, history of CVA, underlying dementia.  Precautions  Precautions Fall  Restrictions  Weight Bearing Restrictions No  Home Living  Family/patient expects to be discharged to: Other  (Comment) (Independent living)  Additional Comments has rollator uses at baseline, has toilet riser, shower chair, walk-in shower (does shower and sponge baths with assist).  Prior Function  Prior Level of Function  Needs assist  Physical Assist  ADLs (physical);Mobility (physical)  Mobility (physical) Transfers;Gait;Bed mobility  ADLs (physical) Bathing;Dressing;IADLs;Grooming  Mobility Comments Assist with bed mobility, transfers, toileting (able to perform independently when no one there to assist per caregiver)  ADLs Comments Private duty agency mon-fri 3hrs/day for past month who assists with ADLs and mobility- staying in routine. Caregiver reports that her and family may potentially be looking into increasing hours. (info from PT eval, caregiver not present during OT eval)  Communication  Communication No difficulties  Pain Assessment  Pain Assessment No/denies pain  Cognition  Arousal/Alertness Awake/alert  Behavior During Therapy Flat affect  Overall Cognitive Status History of cognitive impairments - at baseline  Upper Extremity Assessment  Upper Extremity Assessment RUE deficits/detail;Difficult to assess due to impaired cognition  RUE Deficits / Details Note patient with R arm dependent position hanging off bed and does not notice while sitting EOB. Patient with max difficulty following directions for MMT especially on R side. Does not make much attempt to utilize R UE to support himself in sitting on edge of bed with L lateral and posterior lean.  RUE Coordination decreased fine motor;decreased gross motor  Lower Extremity Assessment  Lower Extremity Assessment Defer to PT evaluation  Vision- History  Baseline Vision/History 1 Wears glasses  ADL  Overall ADL's  Needs assistance/impaired  Grooming Sitting;Maximal assistance  Upper Body Bathing Total assistance;Sitting  Lower Body Bathing Total assistance;Sitting/lateral leans  Upper Body Dressing  Total assistance;Sitting   Lower Body Dressing Total assistance;Sitting/lateral leans;Bed level  Toilet Transfer Details (indicate cue type and reason) unable 2* poor to zero sitting balance  Toileting- Clothing Manipulation and Hygiene Total assistance;Bed level  General ADL Comments Patient needing significant assistance for all self care tasks due to poor to zero sitting balance with L lateral and posterior lean. Patient having difficulty with direction following, does endorse dizziness in sitting but unable to take sitting BP due to poor balance and +1 assist present. BP 185/70 once returned to bed  Bed Mobility  Overal bed mobility Needs Assistance  Bed Mobility Supine to Sit;Sit to Supine  Supine to sit Mod assist;HOB elevated  Sit to supine Max assist  General bed mobility comments Patient needing assistance to bring trunk upright to sitting and difficulty scooting toward edge of bed. Max A to lift legs onto bed and reposition trunk  Transfers  General transfer comment did not attempt due to +1 assist available and poor to zero sitting balance  Balance  Overall balance assessment Needs assistance  Sitting-balance support Feet supported;Single extremity supported;Bilateral upper extremity supported  Sitting balance-Leahy Scale Poor  Postural control Posterior lean;Left lateral lean  OT - End of Session  Activity Tolerance Patient limited by fatigue;Other (comment) (Patient limited by dizziness and nausea)  Patient left in bed;with call bell/phone within reach;with bed alarm set  Nurse Communication Mobility status;Other (comment) (pt nausea and dizziness)  OT Assessment  OT Recommendation/Assessment Patient needs continued OT Services  OT Visit Diagnosis Other abnormalities of gait and mobility (R26.89);Muscle weakness (generalized) (M62.81)  OT Problem List Decreased strength;Decreased range of motion;Decreased activity tolerance;Impaired balance (sitting and/or standing);Decreased coordination;Decreased  safety awareness  OT Plan  OT Frequency (ACUTE ONLY) Min 2X/week  OT Treatment/Interventions (ACUTE ONLY) Self-care/ADL training;Therapeutic exercise;Neuromuscular education;Therapeutic activities;Patient/family education;Balance training  AM-PAC OT "6 Clicks" Daily Activity Outcome Measure (Version 2)  Help from another person eating meals? 2  Help from another person taking care of personal grooming? 2  Help from another person toileting, which includes using toliet, bedpan, or urinal? 1  Help from another person bathing (including washing, rinsing, drying)? 1  Help from another person to put on and taking off regular upper body clothing? 1  Help from another person to put on and taking off regular lower body clothing? 1  6 Click Score 8  Progressive Mobility  What is the highest level of mobility based on the progressive mobility assessment? Level 1 (Bedfast) - Unable to balance while sitting on edge of bed  Activity Dangled on edge of bed  OT Recommendation  Follow Up Recommendations Skilled nursing-short term rehab (<3 hours/day)  Assistance recommended at discharge Frequent or constant Supervision/Assistance  Patient can return home with the following Two people to help with walking and/or transfers;Two people to help with bathing/dressing/bathroom;Assistance with cooking/housework;Assistance with feeding;Direct supervision/assist for medications management;Direct supervision/assist for financial management;Assist for transportation;Help with stairs or ramp for entrance  Functional Status Assessent Patient has had a recent decline in their functional status and demonstrates the ability to make significant improvements in function in a reasonable and predictable amount of time.  OT Equipment BSC/3in1  Individuals Consulted  Consulted and Agree with Results and Recommendations Patient  Acute Rehab OT Goals  Patient Stated Goal "I can't move"  OT Goal Formulation With patient  Time For  Goal Achievement 04/03/21  Potential to Achieve Goals Fair  OT Time Calculation  OT Start Time (ACUTE ONLY) 0809  OT Stop Time (ACUTE ONLY)  0835  OT Time Calculation (min) 26 min  OT General Charges  $OT Visit 1 Visit  OT Evaluation  $OT Eval Moderate Complexity 1 Mod  OT Treatments  $Self Care/Home Management  8-22 mins  Written Expression  Dominant Hand Right   Delbert Phenix OT OT pager: [phone]

## 2021-03-20 NOTE — Anesthesia Postprocedure Evaluation (Signed)
Anesthesia Post Note ? ?Patient: Jesse Nunez ? ?Procedure(s) Performed: ESOPHAGOGASTRODUODENOSCOPY (EGD) WITH PROPOFOL ?BIOPSY ? ?  ? ?Patient location during evaluation: PACU ?Anesthesia Type: MAC ?Level of consciousness: awake and alert ?Pain management: pain level controlled ?Vital Signs Assessment: post-procedure vital signs reviewed and stable ?Respiratory status: spontaneous breathing, nonlabored ventilation, respiratory function stable and patient connected to nasal cannula oxygen ?Cardiovascular status: stable and blood pressure returned to baseline ?Postop Assessment: no apparent nausea or vomiting ?Anesthetic complications: no ? ? ?No notable events documented. ? ?Last Vitals:  ?Vitals:  ? 03/20/21 1350 03/20/21 1400  ?BP: (!) 182/60 (!) 179/56  ?Pulse: (!) 46 (!) 48  ?Resp: 12 17  ?Temp:    ?SpO2: 100% 95%  ?  ?Last Pain:  ?Vitals:  ? 03/20/21 1333  ?TempSrc: Temporal  ?PainSc:   ? ? ?  ?  ?  ?  ?  ?  ? ?Suzette Battiest E ? ? ? ? ?

## 2021-03-20 NOTE — Plan of Care (Signed)
?  Problem: Clinical Measurements: ?Goal: Ability to maintain a body temperature in the normal range will improve ?Outcome: Progressing ?  ?Problem: Activity: ?Goal: Ability to tolerate increased activity will improve ?Outcome: Progressing ?  ?

## 2021-03-20 NOTE — Op Note (Signed)
[facility] ?Patient Name: Jesse Nunez ?Procedure Date: 03/20/2021 ?MRN: [id number] ?Attending MD: Milus Banister , MD ?Date of Birth: [birth year] ?CSN: [id number] ?Age: 81 ?Admit Type: Inpatient ?Procedure:                Upper GI endoscopy ?Indications:              Nausea with vomiting interimttently in the past  ?                          month, no weight loss, abnormal stomach on CT ?Providers:                Milus Banister, MD, Burtis Junes, RN, Janie Billups,  ?                          Technician, Eliberto Ivory ?Referring MD:              ?Medicines:                Propofol per Anesthesia ?Complications:            No immediate complications. Estimated blood loss:  ?                          None. ?Estimated Blood Loss:     Estimated blood loss: none. ?Procedure:                Pre-Anesthesia Assessment: ?                          - Prior to the procedure, a History and Physical  ?                          was performed, and patient medications and  ?                          allergies were reviewed. The patient's tolerance of  ?                          previous anesthesia was also reviewed. The risks  ?                          and benefits of the procedure and the sedation  ?                          options and risks were discussed with the patient.  ?                          All questions were answered, and informed consent  ?                          was obtained. Prior Anticoagulants: The patient has  ?                          taken no previous anticoagulant or antiplatelet  ?  agents. ASA Grade Assessment: III - A patient with  ?                          severe systemic disease. After reviewing the risks  ?                          and benefits, the patient was deemed in  ?                          satisfactory condition to undergo the procedure. ?                          After obtaining informed consent, the endoscope was  ?                          passed  under direct vision. Throughout the  ?                          procedure, the patient's blood pressure, pulse, and  ?                          oxygen saturations were monitored continuously. The  ?                          GIF-H190 ([id number]) Olympus endoscope was introduced  ?                          through the mouth, and advanced to the second part  ?                          of duodenum. The upper GI endoscopy was  ?                          accomplished without difficulty. The patient  ?                          tolerated the procedure well. ?Scope In: ?Scope Out: ?Findings: ?     Very mild inflammation characterized by erythema was found in the  ?     gastric antrum. Biopsies were taken with a cold forceps for histology. ?     A small hiatal hernia was present. ?     The exam was otherwise without abnormality. ?Impression:               - Very mild, non-specific gastritis. Biopsied to  ?                          check for H. pylori ?                          - Small hiatal hernia. ?                          - The examination was otherwise normal. ?  If the biopsies are + for H. pylori then we will  ?                          start him on appropriate antibiotics. If negative  ?                          for H. pylori then focus should be on empirically  ?                          treating his vomiting with daily, scheduled  ?                          antiemetics that I will start now. I spoke with his  ?                          DPOA nephew Jesse Nunez and he was in complete aggreement  ?                          that focus should be on minimal testing and keeping  ?                          him comfortable. I will order heart healthy diet.  ?                           GI will return to see him on Monday if he  ?                          is still hospitalized. Please page or call sooner  ?                          if any questions or concerns. ?Moderate Sedation: ?     Not Applicable -  Patient had care per Anesthesia. ?Recommendation:           - As above ?Procedure Code(s):        --- Professional --- ?                          [phone], Esophagogastroduodenoscopy, flexible,  ?                          transoral; with biopsy, single or multiple ?Diagnosis Code(s):        --- Professional --- ?                          K29.70, Gastritis, unspecified, without bleeding ?                          K44.9, Diaphragmatic hernia without obstruction or  ?                          gangrene ?                          R11.2, Nausea with vomiting,  unspecified ?CPT copyright 2019 [employer]. All rights reserved. ?The codes documented in this report are preliminary and upon coder review may  ?be revised to meet current compliance requirements. ?Milus Banister, MD ?03/20/2021 1:42:25 PM ?This report has been signed electronically. ?Number of Addenda: 0 ?

## 2021-03-20 NOTE — [place] FL2 (Signed)
?[place] MEDICAID FL2 LEVEL OF CARE SCREENING TOOL  ?  ? ?IDENTIFICATION  ?Patient Name: ?Jesse Nunez Birthdate: 1940-10-25 Sex: male Admission Date (Current Location): ?03/18/2021  ?[place] and [place] Number: ? [place] ?  Facility and Address:  ?[facility],  [address] [place], [place] ?     Provider Number: ?[id number]  ?Attending Physician Name and Address:  ?Elmarie Shiley, MD ? Relative Name and Phone Number:  ?Alla German (nephew) Ph: [phone] ?   ?Current Level of Care: ?Hospital Recommended Level of Care: ?[place] Prior Approval Number: ?  ? ?Date Approved/Denied: ?  PASRR Number: ?[id number] A ? ?Discharge Plan: ?SNF ?  ? ?Current Diagnoses: ?Patient Active Problem List  ? Diagnosis Date Noted  ? Essential hypertension 03/20/2021  ? Vitamin D deficiency 03/20/2021  ? Multifocal pneumonia 03/19/2021  ? CKD (chronic kidney disease) stage 3, GFR 30-59 ml/min (HCC) 03/19/2021  ? Diarrhea 03/19/2021  ? Type 2 diabetes mellitus with hyperglycemia ([place]) 03/19/2021  ? Acute metabolic encephalopathy 46/65/9935  ? Nausea & vomiting 03/19/2021  ? Adenomatous polyp of ascending colon   ? Family history of colon cancer 06/24/2020  ? LLQ abdominal pain 06/24/2020  ? Loss of weight 06/24/2020  ? Other constipation 06/24/2020  ? Prolonged QT interval 04/12/2020  ? Generalized weakness 04/11/2020  ? Failure to thrive in adult 04/11/2020  ? Hypokalemia 04/11/2020  ? Dehydration 04/11/2020  ? Fall at home, initial encounter 04/11/2020  ? Shingles 04/11/2020  ? Subarachnoid hemorrhage ([place]) 07/03/2013  ? Depression 07/03/2013  ? COPD exacerbation ([place]) 07/03/2013  ? Syncope 07/02/2013  ? ? ?Orientation RESPIRATION BLADDER Height & Weight   ?  ? (Patient is disoriented x4.) ? O2 (2L/min PRN) Continent Weight: 166 lb 7.2 oz (75.5 kg) ?Height:  '5\' 11"'$  (180.3 cm)  ?BEHAVIORAL SYMPTOMS/MOOD NEUROLOGICAL BOWEL NUTRITION STATUS  ?   (N/A) Continent Diet (See discharge summary)   ?AMBULATORY STATUS COMMUNICATION OF NEEDS Skin   ?Total Care Verbally Normal ?  ?  ?  ?    ?     ?     ? ? ?Personal Care Assistance Level of Assistance  ?Bathing, Feeding, Dressing Bathing Assistance: Maximum assistance ?Feeding assistance: Limited assistance ?Dressing Assistance: Maximum assistance ?   ? ?Functional Limitations Info  ?Sight, Hearing, Speech Sight Info: Adequate ?Hearing Info: Adequate ?Speech Info: Adequate  ? ? ?SPECIAL CARE FACTORS FREQUENCY  ?PT (By licensed PT), OT (By licensed OT)   ?  ?PT Frequency: 5x's/week ?OT Frequency: 5x's/week ?  ?  ?  ?   ? ? ?Contractures Contractures Info: Not present  ? ? ?Additional Factors Info  ?Code Status, Allergies, Insulin Sliding Scale Code Status Info: DNR ?Allergies Info: Glipizide ?  ?Insulin Sliding Scale Info: See discharge summary ?  ?   ? ?Current Medications (03/20/2021):  This is the current hospital active medication list ?Current Facility-Administered Medications  ?Medication Dose Route Frequency Provider Last Rate Last Admin  ? [MAR Hold] acetaminophen (TYLENOL) tablet 650 mg  650 mg Oral Q6H PRN Chotiner, Yevonne Aline, MD      ? Or  ? [MAR Hold] acetaminophen (TYLENOL) suppository 650 mg  650 mg Rectal Q6H PRN Chotiner, Yevonne Aline, MD      ? Doug Sou Hold] Ampicillin-Sulbactam (UNASYN) 3 g in sodium chloride 0.9 % 100 mL IVPB  3 g Intravenous Q8H Poindexter, Leann T, RPH 200 mL/hr at 03/20/21 1143 3 g at 03/20/21 1143  ? [MAR Hold] cyanocobalamin ((VITAMIN B-12)) injection  1,000 mcg  1,000 mcg Intramuscular Daily Regalado, Belkys A, MD   1,000 mcg at 03/20/21 0948  ? dextrose 5 % in lactated ringers infusion   Intravenous Continuous Regalado, Belkys A, MD      ? [MAR Hold] hydrALAZINE (APRESOLINE) injection 10 mg  10 mg Intravenous Q6H PRN Regalado, Belkys A, MD      ? [MAR Hold] hydrALAZINE (APRESOLINE) tablet 25 mg  25 mg Oral Q8H Regalado, Belkys A, MD      ? [MAR Hold] insulin aspart (novoLOG) injection 0-9 Units  0-9 Units Subcutaneous Q6H PRN  Chotiner, Yevonne Aline, MD      ? Doug Sou Hold] ipratropium-albuterol (DUONEB) 0.5-2.5 (3) MG/3ML nebulizer solution 3 mL  3 mL Nebulization Q6H PRN Chotiner, Yevonne Aline, MD      ? Doug Sou Hold] pantoprazole (PROTONIX) injection 40 mg  40 mg Intravenous Q12H Regalado, Belkys A, MD   40 mg at 03/20/21 0944  ? [MAR Hold] prochlorperazine (COMPAZINE) injection 10 mg  10 mg Intravenous Q6H PRN Chotiner, Yevonne Aline, MD      ? Doug Sou Hold] thiamine '500mg'$  in normal saline (51m) IVPB  500 mg Intravenous Daily Regalado, Belkys A, MD 100 mL/hr at 03/20/21 0951 500 mg at 03/20/21 0951  ? [MAR Hold] Vitamin D (Ergocalciferol) (DRISDOL) capsule 50,000 Units  50,000 Units Oral Q7 days Regalado, Belkys A, MD   50,000 Units at 03/20/21 0944  ? ? ? ?Discharge Medications: ?Please see discharge summary for a list of discharge medications. ? ?Relevant Imaging Results: ? ?Relevant Lab Results: ? ? ?Additional Information ?SSN: [id number]? ?MSherie Don LCSW ? ? ? ? ?

## 2021-03-20 NOTE — Anesthesia Preprocedure Evaluation (Addendum)
Anesthesia Evaluation  ?Patient identified by MRN, date of birth, ID band ?Patient awake ? ? ? ?Reviewed: ?Allergy & Precautions, NPO status , Patient's Chart, lab work & pertinent test results ? ?Airway ?Mallampati: II ? ?TM Distance: >3 FB ?Neck ROM: Full ? ? ? Dental ? ?(+) Dental Advisory Given ?  ?Pulmonary ?sleep apnea , pneumonia, unresolved, COPD, former smoker,  ?  ?breath sounds clear to auscultation ? ? ? ? ? ? Cardiovascular ?hypertension, Pt. on medications ? ?Rhythm:Regular Rate:Normal ? ? ?  ?Neuro/Psych ?negative neurological ROS ?   ? GI/Hepatic ?Neg liver ROS, N/v. ?  ?Endo/Other  ?diabetes, Type 2 ? Renal/GU ?CRFRenal disease  ? ?  ?Musculoskeletal ? ? Abdominal ?  ?Peds ? Hematology ?negative hematology ROS ?(+)   ?Anesthesia Other Findings ? ? Reproductive/Obstetrics ? ?  ? ? ? ? ? ? ? ? ? ? ? ? ? ?  ?  ? ? ? ? ? ? ? ? ?Anesthesia Physical ?Anesthesia Plan ? ?ASA: 4 ? ?Anesthesia Plan: MAC  ? ?Post-op Pain Management: Minimal or no pain anticipated  ? ?Induction:  ? ?PONV Risk Score and Plan: 1 and Propofol infusion and Treatment may vary due to age or medical condition ? ?Airway Management Planned: Natural Airway, Nasal Cannula and Simple Face Mask ? ?Additional Equipment:  ? ?Intra-op Plan:  ? ?Post-operative Plan:  ? ?Informed Consent: I have reviewed the patients History and Physical, chart, labs and discussed the procedure including the risks, benefits and alternatives for the proposed anesthesia with the patient or authorized representative who has indicated his/her understanding and acceptance.  ? ?Patient has DNR.  ?Discussed DNR with power of attorney and Suspend DNR. ?  ? ? ?Plan Discussed with: CRNA ? ?Anesthesia Plan Comments:   ? ? ? ? ? ?Anesthesia Quick Evaluation ? ?

## 2021-03-20 NOTE — Interval H&P Note (Signed)
History and Physical Interval Note: ? ?03/20/2021 ?12:41 PM ? ?Jesse Nunez  has presented today for surgery, with the diagnosis of nausea, vomiting, distended stomach on CT scan.  The various methods of treatment have been discussed with the patient and family. After consideration of risks, benefits and other options for treatment, the patient has consented to  Procedure(s): ?ESOPHAGOGASTRODUODENOSCOPY (EGD) WITH PROPOFOL (N/A) as a surgical intervention.  The patient's history has been reviewed, patient examined, no change in status, stable for surgery.  I have reviewed the patient's chart and labs.  Questions were answered to the patient's satisfaction.   ? ? ?Milus Banister ? ? ?

## 2021-03-20 NOTE — Assessment & Plan Note (Addendum)
Started oral supplement.  ?

## 2021-03-21 ENCOUNTER — Encounter (HOSPITAL_COMMUNITY): Payer: Self-pay | Admitting: Family Medicine

## 2021-03-21 ENCOUNTER — Inpatient Hospital Stay (HOSPITAL_COMMUNITY): Payer: Medicare Other

## 2021-03-21 LAB — CBC
HCT: 37.6 % — ABNORMAL LOW (ref 39.0–52.0)
Hemoglobin: 12.2 g/dL — ABNORMAL LOW (ref 13.0–17.0)
MCH: 29 pg (ref 26.0–34.0)
MCHC: 32.4 g/dL (ref 30.0–36.0)
MCV: 89.3 fL (ref 80.0–100.0)
Platelets: 203 10*3/uL (ref 150–400)
RBC: 4.21 MIL/uL — ABNORMAL LOW (ref 4.22–5.81)
RDW: 13 % (ref 11.5–15.5)
WBC: 9.1 10*3/uL (ref 4.0–10.5)
nRBC: 0 % (ref 0.0–0.2)

## 2021-03-21 LAB — BASIC METABOLIC PANEL
Anion gap: 9 (ref 5–15)
BUN: 18 mg/dL (ref 8–23)
CO2: 25 mmol/L (ref 22–32)
Calcium: 9 mg/dL (ref 8.9–10.3)
Chloride: 105 mmol/L (ref 98–111)
Creatinine, Ser: 1.51 mg/dL — ABNORMAL HIGH (ref 0.61–1.24)
GFR, Estimated: 46 mL/min — ABNORMAL LOW (ref >60)
Glucose, Bld: 148 mg/dL — ABNORMAL HIGH (ref 70–99)
Potassium: 3.8 mmol/L (ref 3.5–5.1)
Sodium: 139 mmol/L (ref 135–145)

## 2021-03-21 LAB — GLUCOSE, CAPILLARY
Glucose-Capillary: 106 mg/dL — ABNORMAL HIGH (ref 70–99)
Glucose-Capillary: 113 mg/dL — ABNORMAL HIGH (ref 70–99)
Glucose-Capillary: 118 mg/dL — ABNORMAL HIGH (ref 70–99)
Glucose-Capillary: 125 mg/dL — ABNORMAL HIGH (ref 70–99)
Glucose-Capillary: 145 mg/dL — ABNORMAL HIGH (ref 70–99)
Glucose-Capillary: 152 mg/dL — ABNORMAL HIGH (ref 70–99)
Glucose-Capillary: 164 mg/dL — ABNORMAL HIGH (ref 70–99)

## 2021-03-21 LAB — MAGNESIUM: Magnesium: 2.1 mg/dL (ref 1.7–2.4)

## 2021-03-21 MED ORDER — SODIUM CHLORIDE 0.9 % IV SOLN: INTRAVENOUS | Status: DC

## 2021-03-21 MED ORDER — IOHEXOL 350 MG/ML SOLN
100.0000 mL | Freq: Once | INTRAVENOUS | Status: AC | PRN
  Administered 2021-03-21: 100 mL via INTRAVENOUS

## 2021-03-21 MED ORDER — THIAMINE HCL 100 MG PO TABS: 100.0000 mg | ORAL_TABLET | Freq: Every day | ORAL | Status: DC

## 2021-03-21 MED ORDER — HYDRALAZINE HCL 50 MG PO TABS
50.0000 mg | ORAL_TABLET | Freq: Three times a day (TID) | ORAL | Status: DC
  Filled 2021-03-21: qty 1

## 2021-03-21 NOTE — TOC Progression Note (Signed)
Transition of Care (TOC) - Progression Note  ? ? ?Patient Details  ?Name: Jesse Nunez ?MRN: [id number] ?Date of Birth: 11/28/1940 ? ?Transition of Care (TOC) CM/SW Contact  ?Ross Ludwig, LCSW ?Phone Number: ?03/21/2021, 5:30 PM ? ?Clinical Narrative:    ? ?CSW presented bed offers to patient's nephew [name], [phone].  He will review and call CSW back with a decision.  Patient will need insurance authorization before he discharges to SNF. ? ?Expected Discharge Plan:  ([place] apt vs possible SNF) ?Barriers to Discharge: Continued Medical Work up ? ?Expected Discharge Plan and Services ?Expected Discharge Plan:  ([place] to [place] vs possible SNF) ?In-house Referral: Clinical Social Work ?  ?  ?Living arrangements for the past 2 months: [facility]) ?                ?  ?  ?  ?  ?  ?  ?  ?  ?  ?  ? ? ?Social Determinants of Health (SDOH) Interventions ?  ? ?Readmission Risk Interventions ?No flowsheet data found. ? ?

## 2021-03-21 NOTE — Progress Notes (Signed)
Writer Called [facility] [address] [facility] [place].  Briefly described events of today and tests done and being transferred to ICU, that Leafy Ro will be his nurse, and that visiting hours start at [place] said he would come to visit tomorrow. ?

## 2021-03-21 NOTE — Progress Notes (Signed)
Occupational Therapy Treatment ?Patient Details ?Name: Jesse Nunez ?MRN: [id number] ?DOB: 1941/01/04 ?Today's Date: 03/21/2021 ? ? ?History of present illness Pt is an 81 y.o. male presenting with nausea vomiting and diarrhea for the last few days admitted with aspiration pneumonia, AKI, acute metabolic encephalopathy. PMH significant for diabetes type 2, hypertension, OSA, hyperlipidemia, history of CVA, underlying dementia. ?  ?OT comments ? Treatment limited today due to patient's drowsiness. Patient supine in bed with one safety mitten on. Patient's eyes opened to verbal stimuli. To improve alertness therapist had patient wash his face - and he did so with his left hand. Patient max assist to transfer to side of the bed - his right arm hanging at his side. RUE with increased tone - approximately a 2 on the modified Ashworth scale - noting a mild increase in tone and a catch. Patient has decreased ROM in wrist - only able to extend to neutral. At edge of bed patient has significantly poor balance - falling to the left and requiring max assist to maintain. At times he can use his LUE to prop himself. Eventually patient able to bring his right arm into his lap but doesn't attempt to use it functionally. He was able to kick left leg to functional height but decreased on the right. He continued to be drowsy at edge of bed and was returned to supine for safety. ? ?Of note - with thorough chart review - when patient at [facility] and found to have strokes no physician or therapist noted R sided deficits only generalized weakness. At home PTA patient only had a caregiver for three hours a day and was able to ambulate to the bathroom without assistance. No reports of right sided deficits in chart PTA.  ? ?Continue to recommend short term rehab. Requires +2 assistance for transfers and mobility.  ? ?Recommendations for follow up therapy are one component of a multi-disciplinary discharge planning process, led by the attending  physician.  Recommendations may be updated based on patient status, additional functional criteria and insurance authorization. ?   ?Follow Up Recommendations ? Skilled nursing-short term rehab (<3 hours/day)  ?  ?Assistance Recommended at Discharge    ?Patient can return home with the following ? Two people to help with walking and/or transfers;Two people to help with bathing/dressing/bathroom;Assistance with cooking/housework;Assistance with feeding;Direct supervision/assist for medications management;Direct supervision/assist for financial management;Assist for transportation;Help with stairs or ramp for entrance ?  ?Equipment Recommendations ?  (defer to next venue)  ?  ?Recommendations for Other Services   ? ?  ?Precautions / Restrictions Precautions ?Precautions: Fall ?Restrictions ?Weight Bearing Restrictions: No  ? ? ?  ? ?Mobility Bed Mobility ?Overal bed mobility: Needs Assistance ?Bed Mobility: Supine to Sit ?  ?  ?Supine to sit: Max assist, HOB elevated ?  ?  ?General bed mobility comments: Max assist to transfer into sitting - patient trying to assist with LEs - but needing significant assistance with trunk. Total assist to return to supine. ?  ? ?Transfers ?  ?  ?  ?  ?  ?  ?  ?  ?  ?  ?  ?  ?Balance Overall balance assessment: Needs assistance ?Sitting-balance support: Single extremity supported ?Sitting balance-Leahy Scale: Zero ?Sitting balance - Comments: Requires a lot of physical assistance to maintain upright. Falling over to the left - at times can use LUE to prop himself ?  ?  ?  ?  ?  ?  ?  ?  ?  ?  ?  ?  ?  ?  ?  ?   ? ?  ADL either performed or assessed with clinical judgement  ? ?ADL Overall ADL's : Needs assistance/impaired ?Eating/Feeding: Set up;Bed level ?Eating/Feeding Details (indicate cue type and reason): able to wash face with left hand with encouragement - to try to improve alertness ?  ?  ?  ?  ?  ?  ?  ?  ?  ?  ?  ?  ?  ?  ?  ?  ?  ?  ?  ? ?Extremity/Trunk Assessment  Right upper  extremity exhibits some tone, approximately 2 on the modified ashworth scale in elbow flexion/extension and wrist  ?  ?  ?  ?  ?  ? ?Vision   ?Vision Assessment?: No apparent visual deficits ?  ?   ?   ? ?Cognition Arousal/Alertness: Awake/alert ?Behavior During Therapy: [facility] for tasks assessed/performed ?Overall Cognitive Status: Difficult to assess ?  ?  ?  ?  ?  ?  ?  ?  ?  ?  ?  ?  ?  ?  ?  ?  ?General Comments: Patient drowsy. He is alert to self and knows he is in a hospital. ?  ?  ?   ?   ?   ?   ? ? ?Pertinent Vitals/ Pain       Pain Assessment ?Pain Assessment: No/denies pain ? ? ?Frequency ? Min 2X/week  ? ? ? ? ?  ?Progress Toward Goals ? ?OT Goals(current goals can now be found in the care plan section) ? Progress towards OT goals: Progressing toward goals ? ?Acute Rehab OT Goals ?OT Goal Formulation: Patient unable to participate in goal setting ?Time For Goal Achievement: 04/03/21 ?Potential to Achieve Goals: Fair  ?Plan Discharge plan remains appropriate   ? ?   ?AM-PAC OT "6 Clicks" Daily Activity     ?Outcome Measure ? ? Help from another person eating meals?: A Lot ?Help from another person taking care of personal grooming?: A Little ?Help from another person toileting, which includes using toliet, bedpan, or urinal?: Total ?Help from another person bathing (including washing, rinsing, drying)?: Total ?Help from another person to put on and taking off regular upper body clothing?: A Lot ?Help from another person to put on and taking off regular lower body clothing?: Total ?6 Click Score: 10 ? ?  ?End of Session   ? ?OT Visit Diagnosis: Other abnormalities of gait and mobility (R26.89);Muscle weakness (generalized) (M62.81) ?  ?Activity Tolerance Patient limited by fatigue;Patient limited by lethargy ?  ?Patient Left in bed;with call bell/phone within reach;with bed alarm set ?  ?Nurse Communication Mobility status ?  ? ?   ? ?Time: 0102-7253 ?OT Time Calculation (min): 19 min ? ?Charges: OT General  Charges ?$OT Visit: 1 Visit ?OT Treatments ?$Therapeutic Activity: 8-22 mins ? ?Lauriann Milillo, OTR/L ?Acute Care Rehab Services  ?Office [phone] ?Pager: [phone]  ? ?Aubria Vanecek L Saphyre Cillo ?03/21/2021, 2:58 PM ?

## 2021-03-21 NOTE — Progress Notes (Signed)
?Progress Note ? ? ?Patient: Jesse Nunez LAG:[id number] DOB: Jun 06, 1940 DOA: 03/18/2021     2 ?DOS: the patient was seen and examined on 03/21/2021 ?  ?Brief hospital course: ?81 year old past medical history significant for diabetes type 2, hypertension, OSA, hyperlipidemia, history of CVA who presents for evaluation of nausea vomiting and diarrhea for the last few days.  Patient has had poor oral intake over the last few days.  He has been more lethargic and less interactive for the last 2 days.  He has been having issues with nausea and vomiting for the last several months. ? ?He has been admitted with aspiration pneumonia, acute metabolic encephalopathy, recurrent nausea vomiting and diarrhea. ? ?GI has been consulted, underwent endoscopy, only shoed mild gastritis. Plan to treat conservative , started on Zofran.  ? ?Assessment and Plan: ?* Multifocal pneumonia ?Presents with vomiting, CT chest ; showed bilateral lower lobe opacity denser on the left consistent with multilobar pneumonia or aspiration.  He also presented with worsening altered mental status. ?-Continue with IV Unasyn. ?-Blood Culture no growth ?-He will need a speech and swallow eval ? ?Dehydration ?Received IV fluids.  ?Metabolic acidosis resolved with fluids.   ?Continue with IV fluids.  ?Related to Poor oral intake, nausea and vomiting.  ? ?Diarrhea ?C. Diff ordered. Not send , no further diarrhea.  ? ?CKD (chronic kidney disease) stage 3, GFR 30-59 ml/min (HCC) ?Prior cr per records 1.4--1.6 ?CKD stage III a ?Admitted with dehydration.  ?Continue with IV fluids.  ?Cr down to 1.29. improved.  ? ?Type 2 diabetes mellitus with hyperglycemia ([place]) ?Monitor blood sugar.  ?SSI.  ? ?Prolonged QT interval ?Avoid medications which could further prolong QT interval ? ?Vitamin D deficiency ?Started oral supplement.  ? ?Essential hypertension ?BP elevated, added PRN Hydralazine.  ?Schedule hydralazine, increased dose to 50 mg.  ? ?Nausea & vomiting ?He has  been having issues with nausea, vomiting for couple of month cyclic. ?CT abdomen and pelvis showed; fold thickening of the stomach consistent with gastritis, and gastric fluid distention which could be due to recent ingestion or impaired gastric emptying. ?Continue with  IV PPI ?GI consulted. Underwent endoscopy. No clear cause for vomiting. Started on schedule zofran.  ? ? ? ?Acute metabolic encephalopathy ?Suspect in the setting of infection, aspiration pneumonia, some underlying dementia.  He has been declining per nephew over last few months.  ?Ammonia: 12, B:12: 273, vitamin D 23, TSH 0.75 , RPR Non reactive. [place] Kitchen ?Blood gas negative for hypercapnia mild hypoxemia.  He was placed on oxygen. ?-patient is more alert.  ?-Started on B 12 , vitamin D supplement.  ?-received  IV thiamine high dose for 3 days. Then 100 mg daily.  ? ? ? ? ? ?  ? ?Subjective:  ?He is more alert, he is very weak and deconditioning.  ? ? ?Physical Exam: ?Vitals:  ? 03/20/21 1400 03/20/21 2029 03/21/21 0554 03/21/21 1311  ?BP: (!) 179/56 (!) 141/83 (!) 177/67 (!) 177/78  ?Pulse: (!) 48 (!) 50 (!) 50 (!) 51  ?Resp: '17 17 18 16  '$ ?Temp:  97.7 ?F (36.5 ?C) 97.6 ?F (36.4 ?C)   ?TempSrc:  Oral Oral   ?SpO2: 95% 98% 98% 97%  ?Weight:      ?Height:      ? ?General; NAD ?Lungs; CTA ?Abdomen; soft , nt, nd ?Neuro; follows command, alert, appears weak.  ? ?Data Reviewed: ? ?Cbc and bmet.  ? ?Family Communication: care discussed with nephew who was at bedside today.  ? ?  Disposition: ?Status is: Inpatient ?Remains inpatient appropriate because: advancing diet, treating for encephalopathy/  ? Planned Discharge Destination: Skilled nursing facility ? ? ? ?Time spent: 45 minutes ? ?Author: ?Elmarie Shiley, MD ?03/21/2021 1:51 PM ? ?For on call review [email or web address].  ?

## 2021-03-21 NOTE — Progress Notes (Signed)
NP Blount paged to look at new MRI results ASAP. ?

## 2021-03-21 NOTE — Progress Notes (Addendum)
Dr. Marvel Plan on camera at 2123, paged by site. ROS 3.11.2023 15:00 per MD and bedside when original MRI was placed. MD at bedside while I was on screen at 21:07. 2302 call to [place] rad for read, 2312 message to [facility] re read ?

## 2021-03-21 NOTE — Consult Note (Signed)
?[facility] TeleNeurology Consult Services ? ?Stat Consult ? ?Patient Name:   Jesse Nunez, Jesse Nunez ?Date of Birth:   [birth year] ?Identification Number:   MRN - [id number] ?Date of Service:   03/21/2021 20:21:08 ? ?Diagnosis: ?      I63.9 - Cerebrovascular accident (CVA), unspecified mechanism ([place]) ?      G93.49 - Encephalopathy Multifactorial ?      R53.1 - Weakness ? ?Impression ?81yo man with history of DM, HTN, OSA, HLD, prior left parietal stroke, ?underlying dementia currently admitted for treatment of pneumonia with inpatient stroke alert called for right sided weakness. MRI showed L cerebellar and pons infarcts. He is not a candidate for thrombolytics with LKW > 4.5 hours out. 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 Would proceed with rest of stroke work up with 2D echo. Start aspirin and plavix, permissive HTN to < 220/120 till tomorrow. Would repeat PT/OT and get SLP eval. ? ?Our recommendations are outlined below. ? ?Laboratory Studies: ?Recommend Lipid panel ?Hemoglobin A1c ? ?Antithrombotic Medications: ?Bolus with Clopidogrel 300 mg bolus x1 and initiate dual antiplatelet therapy with Aspirin 81 mg daily and Clopidogrel 75 mg daily ?Please order ? ?Additional Diagnostic Studies: ?2d echo ? ?Nursing Recommendations: ?Telemetry, IV Fluids, avoid dextrose containing fluids, Maintain euglycemia ?Neuro checks q4 hrs x 24 hrs and then per shift ?Head of bed 30 degrees ? ?Consultations: ?Recommend Speech therapy if failed dysphagia screen ?Physical therapy/Occupational therapy ? ?DVT Prophylaxis: ?SCDs, Pneumatic Compression ?Lovenox or LMW Heparin ? ?Disposition: ?Neurology will follow ? ? ? ?---------------------------------------------------------------------------------------------------- ?Advanced Imaging: ?CTA Head and Neck Ordered: ? ? ? ?Metrics: ?[facility] Notification Time: 03/21/2021 20:18:29 ?Stamp Time: 03/21/2021 20:21:08 ?Callback  Response Time: 03/21/2021 20:21:48 ? ? ? ? ?---------------------------------------------------------------------------------------------------- ? ?Chief Complaint: ?right sided weakness ? ?History of Present Illness: ?Patient is a 81 year old Male. ?81yo man with history of DM, HTN, OSA, HLD, prior left parietal stroke, ?underlying dementia currently admitted for treatment of pneumonia with inpatient stroke alert called for right sided weakness. Patient has been confused since admission for pneumonia, baseline unclear from his ALF. He was working with OT today and they noted right sided deficits. MRI brain was done which showed acute posterior circulation infarcts. Last known well is entirely unclear especially given his altered mentation since admission. He is not presently on any antiplatelet or anticoagulation. ? ? ?Past Medical History: ?     Hypertension ?     Hyperlipidemia ?     Stroke ?Other PMH:  HTN, OSA, HLD, prior left parietal stroke, ?underlying dementia ? ?Medications: ? ?No Anticoagulant use  ?No Antiplatelet use ?Reviewed EMR for current medications ? ?Allergies:  ?Reviewed ? ?Social History: ?Unable To Obtain Due To Patient Status : Patient Is Confused ? ?Family History: ? ?Family History Cannot Be Obtained Because:Patient Is Confused ? ?ROS : ?14 Points Review of Systems was performed and was negative except mentioned in HPI. ? ?Past Surgical History: ?Past Surgical History Cannot Be Obtained Because: Patient Is Confused ? ? ?Examination: ?BP(155/63), Pulse(84), ?1A: Level of Consciousness - Arouses to minor stimulation + 1 ?1B: Ask Month and Age - Could Not Answer Either Question Correctly + 2 ?1C: Blink Eyes & Squeeze Hands - Performs Both Tasks + 0 ?2: Test Horizontal Extraocular Movements - Partial Gaze Palsy: Can Be Overcome + 1 ?3: Test Visual Fields - No Visual Loss + 0 ?4: Test Facial Palsy (Use Grimace if Obtunded) - Normal symmetry + 0 ?5A: Test Left  Arm Motor Drift - No Drift for 10  Seconds + 0 ?5B: Test Right Arm Motor Drift - Drift, but doesn't hit bed + 1 ?6A: Test Left Leg Motor Drift - No Drift for 5 Seconds + 0 ?6B: Test Right Leg Motor Drift - Drift, but doesn't hit bed + 1 ?7: Test Limb Ataxia (FNF/Heel-Shin) - Ataxia in 1 Limb + 1 ?8: Test Sensation - Normal; No sensory loss + 0 ?9: Test Language/Aphasia - Normal; No aphasia + 0 ?10: Test Dysarthria - Mild-Moderate Dysarthria: Slurring but can be understood + 1 ?11: Test Extinction/Inattention - No abnormality + 0 ? ?NIHSS Score: 8 ?NIHSS Free Text : possible dysmetria R arm vs weakness, has difficulty gazing to the right but no gaze deviation ? ? ? ? ?Patient / Family was informed the Neurology Consult would occur via TeleHealth consult by way of interactive audio and video telecommunications and consented to receiving care in this manner. ? ?Patient is being evaluated for possible acute neurologic impairment and high probability of imminent or life - threatening deterioration.I spent total of 35 minutes providing care to this patient, including time for face to face visit via telemedicine, review of medical records, imaging studies and discussion of findings with providers, the patient and / or family. ? ? ?Dr Annamary Carolin ? ? ?[facility] ?[phone] ? ?Case [id number] ? ?

## 2021-03-22 ENCOUNTER — Inpatient Hospital Stay (HOSPITAL_COMMUNITY): Payer: Medicare Other

## 2021-03-22 DIAGNOSIS — I6389 Other cerebral infarction: Secondary | ICD-10-CM | POA: Diagnosis not present

## 2021-03-22 DIAGNOSIS — I639 Cerebral infarction, unspecified: Secondary | ICD-10-CM | POA: Diagnosis present

## 2021-03-22 LAB — CBC
HCT: 40.1 % (ref 39.0–52.0)
Hemoglobin: 13.3 g/dL (ref 13.0–17.0)
MCH: 29.6 pg (ref 26.0–34.0)
MCHC: 33.2 g/dL (ref 30.0–36.0)
MCV: 89.1 fL (ref 80.0–100.0)
Platelets: 206 10*3/uL (ref 150–400)
RBC: 4.5 MIL/uL (ref 4.22–5.81)
RDW: 13.1 % (ref 11.5–15.5)
WBC: 11.1 10*3/uL — ABNORMAL HIGH (ref 4.0–10.5)
nRBC: 0 % (ref 0.0–0.2)

## 2021-03-22 LAB — BASIC METABOLIC PANEL
Anion gap: 8 (ref 5–15)
BUN: 16 mg/dL (ref 8–23)
CO2: 25 mmol/L (ref 22–32)
Calcium: 8.9 mg/dL (ref 8.9–10.3)
Chloride: 105 mmol/L (ref 98–111)
Creatinine, Ser: 1.35 mg/dL — ABNORMAL HIGH (ref 0.61–1.24)
GFR, Estimated: 53 mL/min — ABNORMAL LOW (ref >60)
Glucose, Bld: 130 mg/dL — ABNORMAL HIGH (ref 70–99)
Potassium: 3.5 mmol/L (ref 3.5–5.1)
Sodium: 138 mmol/L (ref 135–145)

## 2021-03-22 LAB — GLUCOSE, CAPILLARY
Glucose-Capillary: 126 mg/dL — ABNORMAL HIGH (ref 70–99)
Glucose-Capillary: 132 mg/dL — ABNORMAL HIGH (ref 70–99)
Glucose-Capillary: 114 mg/dL — ABNORMAL HIGH (ref 70–99)
Glucose-Capillary: 165 mg/dL — ABNORMAL HIGH (ref 70–99)
Glucose-Capillary: 99 mg/dL (ref 70–99)

## 2021-03-22 LAB — ECHOCARDIOGRAM COMPLETE
AR max vel: 1.01 cm2
AV Area VTI: 1.1 cm2
AV Area mean vel: 1.13 cm2
AV Mean grad: 15.3 mmHg
AV Peak grad: 32.8 mmHg
Ao pk vel: 2.86 m/s
Area-P 1/2: 2.11 cm2
Calc EF: 56.8 %
Height: 71 in
S' Lateral: 3.9 cm
Single Plane A2C EF: 41.3 %
Single Plane A4C EF: 64.6 %
Weight: 2663.16 oz

## 2021-03-22 MED ORDER — CLOPIDOGREL BISULFATE 75 MG PO TABS
75.0000 mg | ORAL_TABLET | Freq: Every day | ORAL | Status: DC
  Administered 2021-03-22 – 2021-03-23 (×2): 75 mg via ORAL
  Filled 2021-03-22 (×2): qty 1

## 2021-03-22 MED ORDER — ASPIRIN EC 81 MG PO TBEC
81.0000 mg | DELAYED_RELEASE_TABLET | Freq: Every day | ORAL | Status: DC
Start: 2021-03-22 — End: 2021-03-22

## 2021-03-22 MED ORDER — ORAL CARE MOUTH RINSE
15.0000 mL | Freq: Two times a day (BID) | OROMUCOSAL | Status: DC
  Administered 2021-03-22 – 2021-03-23 (×3): 15 mL via OROMUCOSAL

## 2021-03-22 MED ORDER — ENOXAPARIN SODIUM 40 MG/0.4ML IJ SOSY
40.0000 mg | PREFILLED_SYRINGE | INTRAMUSCULAR | Status: DC
  Administered 2021-03-22 – 2021-03-23 (×2): 40 mg via SUBCUTANEOUS
  Filled 2021-03-22 (×2): qty 0.4

## 2021-03-22 MED ORDER — ASPIRIN EC 81 MG PO TBEC
81.0000 mg | DELAYED_RELEASE_TABLET | Freq: Every day | ORAL | Status: DC
  Administered 2021-03-22 – 2021-03-23 (×2): 81 mg via ORAL
  Filled 2021-03-22 (×2): qty 1

## 2021-03-22 MED ORDER — THIAMINE HCL 100 MG/ML IJ SOLN
100.0000 mg | Freq: Every day | INTRAMUSCULAR | Status: DC
  Administered 2021-03-22 – 2021-03-23 (×2): 100 mg via INTRAVENOUS
  Filled 2021-03-22 (×2): qty 2

## 2021-03-22 MED ORDER — CHLORHEXIDINE GLUCONATE CLOTH 2 % EX PADS
6.0000 | MEDICATED_PAD | Freq: Every day | CUTANEOUS | Status: DC
  Administered 2021-03-21 – 2021-03-22 (×2): 6 via TOPICAL

## 2021-03-22 MED ORDER — STROKE: EARLY STAGES OF RECOVERY BOOK
Freq: Once | Status: AC
  Filled 2021-03-22: qty 1

## 2021-03-22 MED ORDER — HYDRALAZINE HCL 20 MG/ML IJ SOLN: 10.0000 mg | Freq: Four times a day (QID) | INTRAMUSCULAR | Status: DC | PRN

## 2021-03-22 MED ORDER — ONDANSETRON HCL 4 MG/2ML IJ SOLN
4.0000 mg | Freq: Every day | INTRAMUSCULAR | Status: DC
  Administered 2021-03-22 – 2021-03-23 (×2): 4 mg via INTRAVENOUS
  Filled 2021-03-22 (×2): qty 2

## 2021-03-22 MED ORDER — ASPIRIN 300 MG RE SUPP
150.0000 mg | Freq: Every day | RECTAL | Status: DC
Start: 2021-03-22 — End: 2021-03-22
  Administered 2021-03-22: 150 mg via RECTAL
  Filled 2021-03-22: qty 1

## 2021-03-22 NOTE — Plan of Care (Signed)
?  Problem: Activity: ?Goal: Ability to tolerate increased activity will improve ?Outcome: Progressing ?  ?Problem: Clinical Measurements: ?Goal: Ability to maintain a body temperature in the normal range will improve ?Outcome: Progressing ?  ?Problem: Respiratory: ?Goal: Ability to maintain adequate ventilation will improve ?Outcome: Progressing ?Goal: Ability to maintain a clear airway will improve ?Outcome: Progressing ?  ?Problem: Education: ?Goal: Knowledge of disease or condition will improve ?Outcome: Progressing ?Goal: Knowledge of secondary prevention will improve (SELECT ALL) ?Outcome: Progressing ?Goal: Knowledge of patient specific risk factors will improve (INDIVIDUALIZE FOR PATIENT) ?Outcome: Progressing ?  ?Problem: Coping: ?Goal: Will verbalize positive feelings about self ?Outcome: Progressing ?Goal: Will identify appropriate support needs ?Outcome: Progressing ?  ?Problem: Health Behavior/Discharge Planning: ?Goal: Ability to manage health-related needs will improve ?Outcome: Progressing ?  ?Problem: Self-Care: ?Goal: Ability to participate in self-care as condition permits will improve ?Outcome: Progressing ?Goal: Verbalization of feelings and concerns over difficulty with self-care will improve ?Outcome: Progressing ?Goal: Ability to communicate needs accurately will improve ?Outcome: Progressing ?  ?Problem: Nutrition: ?Goal: Risk of aspiration will decrease ?Outcome: Progressing ?Goal: Dietary intake will improve ?Outcome: Progressing ?  ?Problem: Ischemic Stroke/TIA Tissue Perfusion: ?Goal: Complications of ischemic stroke/TIA will be minimized ?Outcome: Progressing ?  ?

## 2021-03-22 NOTE — Assessment & Plan Note (Addendum)
Patient presented with AMS, generalized weakness, as he became more alert right arm deficit was noticed.  Neurology was consulted and patient was only transferred to [facility] for stroke evaluation.  CT head without contrast with hypodensity left midbrain, pons and left cerebellum region.  CT angiogram head/neck with occlusion left superior cerebral artery otherwise no other acute large vessel occlusion.  MR brain with multiple small and acute posterior circulation infarcts involving the left cerebellar hemisphere and pons, possible small amount of acute ischemia anterior aspect of a chronic left parieto-occipital infarct.  LDL 78, hemoglobin A1c 6.6.  TTE with LVEF 55 to 60%, no LV regional wall motion normalities, moderate AAS, IVC normal.  Was not a candidate for tPA or intervention; and declines any aggressive intervention.  Neurology recommended continue aspirin 81 mg p.o. daily, Plavix 75 mg p.o. daily.  PT/OT evaluation with recommendation of SNF placement.  Discharging to SNF.  Outpatient follow-up with neurology 4-6 weeks. ? ? ?

## 2021-03-22 NOTE — Evaluation (Signed)
Clinical/Bedside Swallow Evaluation ?Patient Details  ?Name: Jesse Nunez ?MRN: [id number] ?Date of Birth: 1940-12-13 ? ?Today's Date: 03/22/2021 ?Time: SLP Start Time (ACUTE ONLY): 5176 SLP Stop Time (ACUTE ONLY): 1435 ?SLP Time Calculation (min) (ACUTE ONLY): 20 min ? ?Past Medical History:  ?Past Medical History:  ?Diagnosis Date  ? COPD (chronic obstructive pulmonary disease) ([place])   ? Depression   ? Diabetes ([address])   ? Family history of colon cancer   ? Hyperlipidemia   ? Hypertension   ? Macular degeneration disease   ? Shingles   ? Sleep apnea   ? Uses CPAP sometimes  ? ?Past Surgical History:  ?Past Surgical History:  ?Procedure Laterality Date  ? APPENDECTOMY    ? COLONOSCOPY    ? COLONOSCOPY WITH PROPOFOL N/A 07/28/2020  ? Procedure: COLONOSCOPY WITH PROPOFOL;  Surgeon: Thornton Park, MD;  Location: [facility];  Service: Gastroenterology;  Laterality: N/A;  ? POLYPECTOMY  07/28/2020  ? Procedure: POLYPECTOMY;  Surgeon: Thornton Park, MD;  Location: [facility];  Service: Gastroenterology;;  ? TONSILLECTOMY    ? UPPER GASTROINTESTINAL ENDOSCOPY    ? ?HPI:  ?Patient is an 81 y.o. male with PMH: DM-2, HTN, OSA, HLD, h/o CVA, dementia, who presented to hospital with nausea, vomiting and diarrhea for few days prior to admission. Patient's nephew was present at time of admission and reported he had been concerned about dehydration, patient had decreased PO intake last few days and when he did eat or drink he had a lot of coughing and gagging. In addition, patient was becoming more lethargic and less active over past two days. In ED, patient did not require supplemental oxygen and was hemodynamically stable, was afebrile. Chest CT showed basilar PNA which could be due to aspiration. GI evaluated patient and performed EGD on 3/10 which revealed very mild, non-specific gastritis and small hiatal hernia but otherwise normal examination. on 3/11, rapid response was called and patient was found to have had an acute  CVA confirmed by MRI brain: multiple small acute posterior circulation infarcts involving the left cerebellar hemisphere and pons, possible small amount of acute ischemia at the anterior aspect of a chronic left parieto-occipital infarct.  ?  ?Assessment / Plan / Recommendation  ?Clinical Impression ? Patient presents with clinical s/s of dysphagia as per this bedside/clinical swallow evaluation. He did not exhibit any overt s/s of aspiration or penetration but did present with delayed oral transit, suspected delayed swallow initiation. In addition, patient is currently tired/fatigued and only able to maintain adequate alertness for limited amount of PO's. SLP is recommending to initiate full liquids (thin consistency liquids) and plan for follow up likely next date to ensure diet toleration, trial advanced solids if able, determine if objective swallow evaluation required. ?SLP Visit Diagnosis: Dysphagia, unspecified (R13.10) ?   ?Aspiration Risk ? Mild aspiration risk  ?  ?Diet Recommendation Thin liquid;Other (Comment) (full liquids)  ? ?Medication Administration: Whole meds with puree ?Supervision: Full supervision/cueing for compensatory strategies;Staff to assist with self feeding ?Compensations: Slow rate;Small sips/bites ?Postural Changes: Seated upright at 90 degrees  ?  ?Other  Recommendations Oral Care Recommendations: Oral care BID;Staff/trained caregiver to provide oral care   ? ?Recommendations for follow up therapy are one component of a multi-disciplinary discharge planning process, led by the attending physician.  Recommendations may be updated based on patient status, additional functional criteria and insurance authorization. ? ?Follow up Recommendations Other (comment) (TBD)  ? ? ?  ?Assistance Recommended at Discharge Frequent or constant Supervision/Assistance  ?  Functional Status Assessment Patient has had a recent decline in their functional status and demonstrates the ability to make  significant improvements in function in a reasonable and predictable amount of time.  ?Frequency and Duration min 2x/week  ?1 week ?  ?   ? ?Prognosis Prognosis for Safe Diet Advancement: Good  ? ?  ? ?Swallow Study   ?General Date of Onset: 03/19/21 ?HPI: Patient is an 81 y.o. male with PMH: DM-2, HTN, OSA, HLD, h/o CVA, dementia, who presented to hospital with nausea, vomiting and diarrhea for few days prior to admission. Patient's nephew was present at time of admission and reported he had been concerned about dehydration, patient had decreased PO intake last few days and when he did eat or drink he had a lot of coughing and gagging. In addition, patient was becoming more lethargic and less active over past two days. In ED, patient did not require supplemental oxygen and was hemodynamically stable, was afebrile. Chest CT showed basilar PNA which could be due to aspiration. GI evaluated patient and performed EGD on 3/10 which revealed very mild, non-specific gastritis and small hiatal hernia but otherwise normal examination. on 3/11, rapid response was called and patient was found to have had an acute CVA confirmed by MRI brain: multiple small acute posterior circulation infarcts involving the left cerebellar hemisphere and pons, possible small amount of acute ischemia at the anterior aspect of a chronic left parieto-occipital infarct. ?Type of Study: Bedside Swallow Evaluation ?Previous Swallow Assessment: none with speech, EGD on 3/9 ?Diet Prior to this Study: NPO ?Temperature Spikes Noted: No ?Respiratory Status: Room air ?History of Recent Intubation: No ?Behavior/Cognition: Alert;Pleasant mood;Cooperative;Lethargic/Drowsy ?Oral Care Completed by SLP: Yes ?Oral Cavity - Dentition: Adequate natural dentition ?Self-Feeding Abilities: Needs assist;Needs set up ?Patient Positioning: Upright in bed ?Baseline Vocal Quality: Low vocal intensity ?Volitional Cough: Cognitively unable to elicit ?Volitional Swallow:  Unable to elicit  ?  ?Oral/Motor/Sensory Function Overall Oral Motor/Sensory Function: Moderate impairment ?Facial ROM: Reduced left ?Facial Symmetry: Abnormal symmetry left;Suspected CN VII (facial) dysfunction ?Facial Strength: Reduced left ?Lingual Symmetry: Within Functional Limits ?Lingual Strength: Reduced   ?Ice Chips     ?Thin Liquid Thin Liquid: Impaired ?Presentation: Cup ?Oral Phase Impairments: Reduced lingual movement/coordination ?Pharyngeal  Phase Impairments: Suspected delayed Swallow  ?  ?Nectar Thick     ?Honey Thick     ?Puree Puree: Within functional limits ?Presentation: Spoon   ?Solid ? ? ?  Solid: Not tested  ? ?  ? ?Sonia Baller, MA, CCC-SLP ?Speech Therapy ? ? ? ? ?

## 2021-03-22 NOTE — Progress Notes (Signed)
?Progress Note ? ? ?Patient: Jesse Nunez PPI:[id number] DOB: [birth year] DOA: 03/18/2021     3 ?DOS: the patient was seen and examined on 03/22/2021 ?  ?Brief hospital course: ?81 year old past medical history significant for diabetes type 2, hypertension, OSA, hyperlipidemia, history of CVA who presents for evaluation of nausea vomiting and diarrhea for the last few days.  Patient has had poor oral intake over the last few days.  He has been more lethargic and less interactive for the last 2 days.  He has been having issues with nausea and vomiting for the last several months. ? ?He has been admitted with aspiration pneumonia, acute metabolic encephalopathy, recurrent nausea vomiting and diarrhea. ? ?GI has been consulted, underwent endoscopy, only shoed mild gastritis. Plan to treat conservative , started on Zofran.  ? ?Patient was admitted with AMS, generalized weakness. He was noticed to have less used of right arm or function 3/10. On my evaluation 3/09 patient had weakness of Both upper extremities, he was not alert enough. As he continue to become more alert, right arm deficit was notice more 3/11. MRI was obtain for further evaluation on 3/11, confirm acute stroke.  ? ?Patient will be transfer to [facility] for stroke team evaluation.  ? ?Assessment and Plan: ?* Acute stroke due to ischemia [facility]) ?Patient presented with AMS, generalized weakness, as he became more alert right arm deficit was noticed.  ?-TPA; not candidate, Unclear when right side deficit started. He presented with generalized weakness, AMS>  ?-MRI brain: Multiple small and acute posterior circulation infarct involving the left cerebellar hemisphere and pons.  Possible small amount of acute ischemia at the anterior aspect of a chronic left parieto-occipital infarct. ?-CTA: Sutter hypodensity involving the left midbrain, pons and left cerebellum, consistent with the previously identified ischemic infarct, no associated hemorrhage or significant mass  effect. ?-He was started on aspirin.  Neurology consulted.  Patient will be transferred to [facility] for stroke team evaluation. ?-2D echo, carotid Doppler ordered. ?-Lipid  panel, A1c ordered. ?Today he is able to move right arm mild drift notice, he has mild left facial droop. He is more alert today. He is able to tell me his name, his age, his nephew names. He was able to follows command.  ?-started on aspirin.  ? ? ?Multifocal pneumonia ?Presents with vomiting, CT chest ; showed bilateral lower lobe opacity denser on the left consistent with multilobar pneumonia or aspiration.  He also presented with worsening altered mental status. ?-Continue with IV Unasyn. Plan for total 5 days. Day 4/5 ?-Blood Culture no growth ?-He will need a speech and swallow eval ?-getting ECHO in setting of stroke.  ? ?Dehydration ?Received IV fluids.  ?Metabolic acidosis resolved with fluids.   ?Continue with IV fluids.  ?Related to Poor oral intake, nausea and vomiting.  ? ?Diarrhea ?C. Diff ordered. Not send , no further diarrhea.  ? ?CKD (chronic kidney disease) stage 3, GFR 30-59 ml/min ([facility]) ?Prior cr per records 1.4--1.6 ?CKD stage III a ?Admitted with dehydration.  ?Continue with IV fluids.  ?Cr stable.  ? ?Type 2 diabetes mellitus with hyperglycemia ([place]) ?Monitor blood sugar.  ?SSI.  ? ?Prolonged QT interval ?Avoid medications which could further prolong QT interval ? ?Vitamin D deficiency ?Started oral supplement.  ? ?Essential hypertension ?BP elevated, added PRN Hydralazine.  ?Holding hydralazine in setting of stroke.  ? ?Nausea & vomiting ?He has been having issues with nausea, vomiting for couple of month cyclic. ?CT abdomen and pelvis showed; fold  thickening of the stomach consistent with gastritis, and gastric fluid distention which could be due to recent ingestion or impaired gastric emptying. ?Continue with  IV PPI ?GI consulted. Underwent endoscopy. No clear cause for vomiting. Started on schedule zofran.  ?I  wonder if vomiting was related to stroke, difficult to confirm.  ? ? ? ?Acute metabolic encephalopathy ?Suspect in the setting of infection, aspiration pneumonia, some underlying dementia.  He has been declining per nephew over last few months.  ?As he became more alert, right side deficit was notice more. MRI positive for stroke.  ?Ammonia: 12, B:12: 273, vitamin D 23, TSH 0.75 , RPR Non reactive. Marland Kitchen ?Blood gas negative for hypercapnia mild hypoxemia.  He was placed on oxygen. ?-patient is more alert.  ?-Started on B 12 , vitamin D supplement.  ?-Received  IV thiamine high dose for 3 days. Then 100 mg daily.  ?He is alert, for me he is moving both arm, mild drift right upper extremity, mild left facial droop.  ?-transfer to [facility] for stroke team evaluation.  ? ? ?Urigen retention; Foley catheter was removed yesterday. He continue to retain. Plan to placed foley catheter.  ? ?  ? ?Subjective: He is more alert to me today, he told me his name, his age. His nephew name.  ?He is following more command today. He needs to urinate, unable to to do so. Nurse will place foley catheter.  ? ?Physical Exam: ?Vitals:  ? 03/22/21 0400 03/22/21 0428 03/22/21 0500 03/22/21 0600  ?BP:  (!) 172/114 (!) 186/64 (!) 150/50  ?Pulse:   60 65  ?Resp:  '19 16 13  '$ ?Temp:   97.9 ?F (36.6 ?C)   ?TempSrc:      ?SpO2: 100%  97% 96%  ?Weight:      ?Height:      ? ?General; NAD ?CVS; S 1, S 2 RRR ?Lung; CTA ?Neuro; alert, follows command, speech is clear, less weak, left mild facial droop. He was able to move both arm , mild drift notice on right arm, moves BL LE.  ?  ? ? ?Data Reviewed: ? ?Cbc, bmet, MRI reviewed.  ? ?Family Communication: Discussed new diagnosis of stroke  with nephew, maybe patient presented with stroke. Plan for stroke evaluation and transfer to [facility]. No plan for invasive procedure, patient is DNR>  ? ?Disposition: ?Status is: Inpatient ?Remains inpatient appropriate because: admitted with AMS, aspiration PNA,  stroke.  ? Planned Discharge Destination: Skilled nursing facility ? ? ? ?Time spent: 45 minutes ? ?Author: ?Elmarie Shiley, MD ?03/22/2021 8:38 AM ? ?For on call review [email or web address].  ?

## 2021-03-22 NOTE — Progress Notes (Addendum)
STROKE TEAM PROGRESS NOTE   INTERVAL HISTORY No family at the bedside. Patient was just transferred from [facility]. He states that he is tired, answers questions appropriately, states he feels okay. No nausea, vomiting, or palpitations as this time. No numbness or tingling.   Vitals:   03/22/21 1000 03/22/21 1100 03/22/21 1200 03/22/21 1322  BP: (!) 183/129 (!) 154/75 (!) 179/78 (!) 180/74  Pulse: (!) 56 67 63 (!) 58  Resp: 20 (!) 22 (!) 23 18  Temp:   97.6 F (36.4 C) (!) 97.5 F (36.4 C)  TempSrc:   Oral Oral  SpO2: 100% 97% 99% 98%  Weight:      Height:       CBC:  Recent Labs  Lab 03/18/21 2307 03/18/21 2326 03/21/21 0257 03/22/21 0312  WBC 8.7   < > 9.1 11.1*  NEUTROABS 6.7  --   --   --   HGB 13.9   < > 12.2* 13.3  HCT 42.6   < > 37.6* 40.1  MCV 89.7   < > 89.3 89.1  PLT 232   < > 203 206   < > = values in this interval not displayed.   Basic Metabolic Panel:  Recent Labs  Lab 03/20/21 0316 03/21/21 0257 03/22/21 0312  NA 138 139 138  K 4.0 3.8 3.5  CL 106 105 105  CO2 '22 25 25  '$ GLUCOSE 105* 148* 130*  BUN '19 18 16  '$ CREATININE 1.29* 1.51* 1.35*  CALCIUM 8.7* 9.0 8.9  MG 1.9 2.1  --    Lipid Panel: No results for input(s): CHOL, TRIG, HDL, CHOLHDL, VLDL, LDLCALC in the last 168 hours. HgbA1c:  Recent Labs  Lab 03/18/21 2307  HGBA1C 6.6*   Urine Drug Screen: No results for input(s): LABOPIA, COCAINSCRNUR, LABBENZ, AMPHETMU, THCU, LABBARB in the last 168 hours.  Alcohol Level No results for input(s): ETH in the last 168 hours.  IMAGING past 24 hours MR BRAIN WO CONTRAST  Result Date: 03/21/2021 CLINICAL DATA:  Neuro deficit, acute, stroke suspected. Encephalopathy. EXAM: MRI HEAD WITHOUT CONTRAST TECHNIQUE: Multiplanar, multiecho pulse sequences of the brain and surrounding structures were obtained without intravenous contrast. COMPARISON:  Head CT 03/19/2021 and MRI 04/11/2020 FINDINGS: Brain: There are multiple small acute infarcts involving the peripheral  and deep aspect of the left cerebellar hemisphere, left middle cerebellar peduncle, and left pons. A chronic left parieto-occipital infarct is larger than on the prior MRI, and there are associated chronic blood products. There is a 1 cm focus of restricted diffusion at the anterior aspect of the chronic left parieto-occipital infarct which abuts the atrium of the left lateral ventricle and could reflect a small amount of superimposed acute ischemia in this region. There is a small chronic right occipital infarct which is new or larger than on the prior MRI. A chronic right temporal infarct is unchanged. Patchy T2 hyperintensities in the cerebral white matter bilaterally have slightly progressed from prior MRI and are nonspecific but compatible with moderately extensive chronic small vessel ischemic disease. Multiple chronic lacunar infarcts are present in the basal ganglia bilaterally, thalami, brainstem, and cerebellum, progressed from the prior MRI and the brainstem. There is moderate cerebral atrophy. No mass, midline shift, or extra-axial fluid collection is identified. A small chronic hemorrhage is noted in the right parietal lobe. Vascular: Chronically abnormal appearance of the distal right vertebral artery which is small and likely chronically occluded. Other major intracranial vascular flow voids are preserved. Skull and upper cervical spine:  Unremarkable bone marrow signal. Sinuses/Orbits: Unremarkable orbits. Mucous retention cyst in the left maxillary sinus. Trace left mastoid effusion. Other: None. IMPRESSION: 1. Multiple small acute posterior circulation infarcts involving the left cerebellar hemisphere and pons. 2. Possible small amount of acute ischemia at the anterior aspect of a chronic left parieto-occipital infarct. 3. 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 Electronically Signed   By: Logan Bores M.D.   On: 03/21/2021 19:12    ECHOCARDIOGRAM COMPLETE  Result Date: 03/22/2021    ECHOCARDIOGRAM REPORT   Patient Name:   OCTAVIANO MUKAI Date of Exam: 03/22/2021 Medical Rec #:  [id number]   Height:       71.0 in Accession #:    [id number]  Weight:       166.4 lb Date of Birth:  02/24/1940    BSA:          1.950 m Patient Age:    81 years    BP:           154/75 mmHg Patient Gender: M           HR:           55 bpm. Exam Location:  Inpatient Procedure: 2D Echo, Cardiac Doppler and Color Doppler Indications:    Stroke I63.9  History:        Patient has prior history of Echocardiogram examinations, most                 recent 07/04/2013. Risk Factors:Hypertension, Dyslipidemia and                 Sleep Apnea. Aspiration pneumonia, acute metabolic                 encephalopathy. Chronic kidney disease.  Sonographer:    Darlina Sicilian RDCS Referring Phys: [phone] [facility] A REGALADO  Sonographer Comments: Suboptimal parasternal window and suboptimal apical window. IMPRESSIONS  1. Left ventricular ejection fraction, by estimation, is 55 to 60%. The left ventricle has normal function. The left ventricle has no regional wall motion abnormalities. Left ventricular diastolic parameters are indeterminate.  2. Right ventricular systolic function is normal. The right ventricular size is normal.  3. The mitral valve is normal in structure. No evidence of mitral valve regurgitation.  4. AV is thickened, calcified DIffiuclt to see well Peak and mean gradients through the vlae are 41 nad 18 mm Hg repectively AVA (VTI) is 1 cm2. Dimensionless indext is o.29 consistet with moderate AS Note SVI is 36 which may explain lower gradients. Compared to echo report from 2018, mean graident is mildly increased (12 to 18 mm Hg) . Aortic valve regurgitation is mild.  5. The inferior vena cava is normal in size with greater than 50% respiratory variability, suggesting right atrial pressure of 3 mmHg. FINDINGS  Left Ventricle: Left ventricular ejection fraction, by estimation, is  55 to 60%. The left ventricle has normal function. The left ventricle has no regional wall motion abnormalities. The left ventricular internal cavity size was normal in size. There is  no left ventricular hypertrophy. Left ventricular diastolic parameters are indeterminate. Right Ventricle: The right ventricular size is normal. Right vetricular wall thickness was not assessed. Right ventricular systolic function is normal. Left Atrium: Left atrial size was normal in size. Right Atrium: Right atrial size was normal in size. Pericardium: There is no evidence of pericardial effusion. Mitral Valve: The mitral valve is normal in structure. No evidence of mitral valve regurgitation.  Tricuspid Valve: The tricuspid valve is normal in structure. Tricuspid valve regurgitation is mild. Aortic Valve: AV is thickened, calcified DIffiuclt to see well Peak and mean gradients through the vlae are 41 nad 18 mm Hg repectively AVA (VTI) is 1 cm2. Dimensionless indext is o.29 consistet with moderate AS Note SVI is 36 which may explain lower gradients. Compared to echo report from 2018, mean graident is mildly increased (12 to 18 mm Hg). Aortic valve regurgitation is mild. Aortic valve mean gradient measures 15.3 mmHg. Aortic valve peak gradient measures 32.8 mmHg. Aortic valve area, by VTI measures 1.10 cm. Pulmonic Valve: The pulmonic valve was grossly normal. Pulmonic valve regurgitation is trivial. Aorta: The aortic root is normal in size and structure. Venous: The inferior vena cava is normal in size with greater than 50% respiratory variability, suggesting right atrial pressure of 3 mmHg. IAS/Shunts: No atrial level shunt detected by color flow Doppler.  LEFT VENTRICLE PLAX 2D LVIDd:         5.50 cm      Diastology LVIDs:         3.90 cm      LV e' medial:    4.22 cm/s LV PW:         0.90 cm      LV E/e' medial:  9.5 LV IVS:        0.90 cm      LV e' lateral:   4.30 cm/s LVOT diam:     2.20 cm      LV E/e' lateral: 9.4 LV SV:          70 LV SV Index:   36 LVOT Area:     3.80 cm  LV Volumes (MOD) LV vol d, MOD A2C: 74.6 ml LV vol d, MOD A4C: 147.0 ml LV vol s, MOD A2C: 43.8 ml LV vol s, MOD A4C: 52.1 ml LV SV MOD A2C:     30.8 ml LV SV MOD A4C:     147.0 ml LV SV MOD BP:      64.9 ml RIGHT VENTRICLE RV S prime:     9.49 cm/s LEFT ATRIUM             Index LA diam:        3.70 cm 1.90 cm/m LA Vol (A2C):   25.6 ml 13.13 ml/m LA Vol (A4C):   43.0 ml 22.05 ml/m LA Biplane Vol: 36.2 ml 18.56 ml/m  AORTIC VALVE AV Area (Vmax):    1.01 cm AV Area (Vmean):   1.13 cm AV Area (VTI):     1.10 cm AV Vmax:           286.33 cm/s AV Vmean:          176.000 cm/s AV VTI:            0.637 m AV Peak Grad:      32.8 mmHg AV Mean Grad:      15.3 mmHg LVOT Vmax:         76.40 cm/s LVOT Vmean:        52.200 cm/s LVOT VTI:          0.185 m LVOT/AV VTI ratio: 0.29  AORTA Ao Root diam: 3.40 cm MITRAL VALVE MV Area (PHT): 2.11 cm    SHUNTS MV Decel Time: 360 msec    Systemic VTI:  0.18 m MV E velocity: 40.30 cm/s  Systemic Diam: 2.20 cm MV A velocity: 73.70 cm/s MV E/A ratio:  0.55 Nevin Bloodgood  Harrington Challenger MD Electronically signed by Dorris Carnes MD Signature Date/Time: 03/22/2021/1:07:37 PM    Final    CT HEAD CODE STROKE WO CONTRAST`  Result Date: 03/21/2021 CLINICAL DATA:  Initial evaluation for neuro deficit, stroke suspected. EXAM: CT ANGIOGRAPHY HEAD AND NECK CT PERFUSION BRAIN TECHNIQUE: Multidetector CT imaging of the head and neck was performed using the standard protocol during bolus administration of intravenous contrast. Multiplanar CT image reconstructions and MIPs were obtained to evaluate the vascular anatomy. Carotid stenosis measurements (when applicable) are obtained utilizing NASCET criteria, using the distal internal carotid diameter as the denominator. Multiphase CT imaging of the brain was performed following IV bolus contrast injection. Subsequent parametric perfusion maps were calculated using RAPID software. RADIATION DOSE REDUCTION: This exam was  performed according to the departmental dose-optimization program which includes automated exposure control, adjustment of the mA and/or kV according to patient size and/or use of iterative reconstruction technique. CONTRAST:  141m OMNIPAQUE IOHEXOL 350 MG/ML SOLN COMPARISON:  Prior MRI from earlier the same day. FINDINGS: CT HEAD FINDINGS Brain: Moderately advanced cerebral atrophy. 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 Chronic encephalomalacia at the left parieto-occipital region consistent with a chronic ischemic infarct. Vague hypodensity involving the left midbrain and pons as well as the left cerebellum, consistent with previously identified infarct, better seen on prior MRI. No associated hemorrhage or significant mass effect. No other acute large vessel territory infarct. No mass lesion or midline shift. No hydrocephalus or extra-axial fluid collection. Vascular: No hyperdense vessel. Calcified atherosclerosis present at the skull base. Skull: Scalp soft tissues and calvarium within normal limits. Sinuses/Orbits: Globes orbital soft tissues demonstrate no acute finding. Scattered mucosal thickening noted within the sphenoid ethmoidal and maxillary sinuses, with a few small retention cyst noted. No significant mastoid effusion. Other: None. 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 Moderate atheromatous change about the visualized intrathoracic aorta. Normal branch pattern seen at the aortic arch. No stenosis about the origin of the great vessels. Right carotid system: Right common and  internal carotid arteries are tortuous but patent without significant stenosis or dissection. Mild plaque about the right carotid bulb without stenosis. Left carotid system: Left common and internal carotid arteries tortuous without stenosis or dissection. Mild atheromatous change about the left carotid bulb without stenosis. Vertebral arteries: Both vertebral arteries arise from the subclavian arteries. No significant proximal subclavian artery stenosis. Strongly dominant left vertebral artery widely patent. Right vertebral artery markedly hypoplastic with intermittently attenuated flow, but remains grossly patent to the skull base. No dissection. Skeleton: No discrete or worrisome osseous lesions. Mild-to-moderate multilevel cervical spondylosis, most pronounced at C5-6 and C6-7. Other neck: No other acute soft tissue abnormality within the neck. Small amount of secretions noted within the subglottic trachea. Upper chest: Probable small pericardial effusion partially visualized. Emphysema. Visualized upper chest demonstrates no other acute finding. Review of the MIP images confirms the above findings CTA HEAD FINDINGS Anterior circulation: Petrous segments patent bilaterally. Atheromatous change throughout the carotid siphons without significant stenosis. A1 segments patent bilaterally. Normal anterior communicating artery complex. Atheromatous irregularity throughout the ACAs without high-grade stenosis. Right M1 segment widely patent. Left M1 segment smoothly tapers  towards its distal aspect, with short-segment severe proximal left M2 stenosis (series 10, image 63). No proximal MCA branch occlusion. Extensive small vessel atheromatous irregularity throughout the MCA branches bilaterally. Posterior circulation: Strongly dominant left vertebral artery widely patent to the vertebrobasilar junction. Left PICA patent. Hypoplastic right vertebral artery remains patent to the vertebrobasilar junction. Right PICA not  seen. Basilar patent to its distal aspect without stenosis. Left superior cerebellar artery not seen, likely occluded given the acute ischemic changes. Right SCA patent. Both PCAs primarily supplied via the basilar. Extensive atheromatous irregularity throughout the PCAs bilaterally with associated moderate to severe proximal left P2 stenoses (series 10, image 62). PCAs remain patent to their distal aspects. Venous sinuses: Patent allowing for timing the contrast bolus. Anatomic variants: Strongly dominant left vertebral artery. No aneurysm. Review of the MIP images confirms the above findings CT Brain Perfusion Findings: ASPECTS: 10. CBF (<30%) Volume: 32m Perfusion (Tmax>6.0s) volume: 158mMismatch Volume: 1629mnfarction Location:Decreased perfusion seen within the left cerebellar hemisphere, in keeping with the previously identified ischemic infarct. No other perfusion abnormality. IMPRESSION: CT HEAD: 1. Subtle hypodensity involving the left midbrain, pons, and left cerebellum, consistent with the previously identified ischemic infarct, better seen on prior MRI. No associated hemorrhage or significant mass effect. 2. Aspects = 10. 3. Advanced cerebral atrophy with extensive chronic ischemic changes, stable. CTA HEAD AND NECK: 1. 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 2. No other acute large vessel occlusion or other emergent finding. 3. Advanced intracranial atherosclerotic disease with associated short-segment moderate to severe proximal left M2 and left P2 stenoses. 4. Diffuse tortuosity of the major arterial vasculature of the head and neck, suggesting chronic underlying hypertension. 5. 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 Recommend annual imaging followup by CTA or MRA. This recommendation follows 2010 ACCF/AHA/AATS/ACR/ASA/SCA/SCAI/SIR/STS/SVM Guidelines for the Diagnosis and Management of Patients with Thoracic  Aortic Disease. Circulation. 2010; 121: E: J188-C166ortic aneurysm NOS (ICD10-I71.9). 6. Aortic Atherosclerosis (ICD10-I70.0) and Emphysema (ICD10-J43.9). CT PERFUSION: 16 mL perfusion deficit involving the left cerebellar hemisphere, in keeping with the previously identified ischemic infarct. Critical Value/emergent results were called by telephone at the time of interpretation on 03/21/2021 at 10:30 pm to provider XENLovey Newcomerwho verbally acknowledged these results. Electronically Signed   By: BenJeannine BogaD.   On: 03/21/2021 22:48   CT ANGIO HEAD NECK W WO CM W PERF (CODE STROKE)  Result Date: 03/21/2021 CLINICAL DATA:  Initial evaluation for neuro deficit, stroke suspected. EXAM: CT ANGIOGRAPHY HEAD AND NECK CT PERFUSION BRAIN TECHNIQUE: Multidetector CT imaging of the head and neck was performed using the standard protocol during bolus administration of intravenous contrast. Multiplanar CT image reconstructions and MIPs were obtained to evaluate the vascular anatomy. Carotid stenosis measurements (when applicable) are obtained utilizing NASCET criteria, using the distal internal carotid diameter as the denominator. Multiphase CT imaging of the brain was performed following IV bolus contrast injection. Subsequent parametric perfusion maps were calculated using RAPID software. RADIATION DOSE REDUCTION: This exam was performed according to the departmental dose-optimization program which includes automated exposure control, adjustment of the mA and/or kV according to patient size and/or use of iterative reconstruction technique. CONTRAST:  100m63mNIPAQUE IOHEXOL 350 MG/ML SOLN COMPARISON:  Prior MRI from earlier the same day. FINDINGS: CT HEAD FINDINGS Brain: Moderately advanced cerebral atrophy. 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 Chronic encephalomalacia  at the left parieto-occipital region  consistent with a chronic ischemic infarct. Vague hypodensity involving the left midbrain and pons as well as the left cerebellum, consistent with previously identified infarct, better seen on prior MRI. No associated hemorrhage or significant mass effect. No other acute large vessel territory infarct. No mass lesion or midline shift. No hydrocephalus or extra-axial fluid collection. Vascular: No hyperdense vessel. Calcified atherosclerosis present at the skull base. Skull: Scalp soft tissues and calvarium within normal limits. Sinuses/Orbits: Globes orbital soft tissues demonstrate no acute finding. Scattered mucosal thickening noted within the sphenoid ethmoidal and maxillary sinuses, with a few small retention cyst noted. No significant mastoid effusion. Other: None. 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 Moderate atheromatous change about the visualized intrathoracic aorta. Normal branch pattern seen at the aortic arch. No stenosis about the origin of the great vessels. Right carotid system: Right common and internal carotid arteries are tortuous but patent without significant stenosis or dissection. Mild plaque about the right carotid bulb without stenosis. Left carotid system: Left common and internal carotid arteries tortuous without stenosis or dissection. Mild atheromatous change about the left carotid bulb without stenosis. Vertebral arteries: Both vertebral arteries arise from the subclavian arteries. No significant proximal subclavian artery stenosis. Strongly dominant left vertebral artery widely patent. Right vertebral artery markedly hypoplastic with intermittently  attenuated flow, but remains grossly patent to the skull base. No dissection. Skeleton: No discrete or worrisome osseous lesions. Mild-to-moderate multilevel cervical spondylosis, most pronounced at C5-6 and C6-7. Other neck: No other acute soft tissue abnormality within the neck. Small amount of secretions noted within the subglottic trachea. Upper chest: Probable small pericardial effusion partially visualized. Emphysema. Visualized upper chest demonstrates no other acute finding. Review of the MIP images confirms the above findings CTA HEAD FINDINGS Anterior circulation: Petrous segments patent bilaterally. Atheromatous change throughout the carotid siphons without significant stenosis. A1 segments patent bilaterally. Normal anterior communicating artery complex. Atheromatous irregularity throughout the ACAs without high-grade stenosis. Right M1 segment widely patent. Left M1 segment smoothly tapers towards its distal aspect, with short-segment severe proximal left M2 stenosis (series 10, image 63). No proximal MCA branch occlusion. Extensive small vessel atheromatous irregularity throughout the MCA branches bilaterally. Posterior circulation: Strongly dominant left vertebral artery widely patent to the vertebrobasilar junction. Left PICA patent. Hypoplastic right vertebral artery remains patent to the vertebrobasilar junction. Right PICA not seen. Basilar patent to its distal aspect without stenosis. Left superior cerebellar artery not seen, likely occluded given the acute ischemic changes. Right SCA patent. Both PCAs primarily supplied via the basilar. Extensive atheromatous irregularity throughout the PCAs bilaterally with associated moderate to severe proximal left P2 stenoses (series 10, image 62). PCAs remain patent to their distal aspects. Venous sinuses: Patent allowing for timing the contrast bolus. Anatomic variants: Strongly dominant left vertebral artery. No aneurysm. Review of the MIP images confirms  the above findings CT Brain Perfusion Findings: ASPECTS: 10. CBF (<30%) Volume: 49m Perfusion (Tmax>6.0s) volume: 117mMismatch Volume: [id number] Location:Decreased perfusion seen within the left cerebellar hemisphere, in keeping with the previously identified ischemic infarct. No other perfusion abnormality. IMPRESSION: CT HEAD: 1. Subtle hypodensity involving the left midbrain, pons, and left cerebellum, consistent with the previously  identified ischemic infarct, better seen on prior MRI. No associated hemorrhage or significant mass effect. 2. Aspects = 10. 3. Advanced cerebral atrophy with extensive chronic ischemic changes, stable. CTA HEAD AND NECK: 1. 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 2. No other acute large vessel occlusion or other emergent finding. 3. Advanced intracranial atherosclerotic disease with associated short-segment moderate to severe proximal left M2 and left P2 stenoses. 4. Diffuse tortuosity of the major arterial vasculature of the head and neck, suggesting chronic underlying hypertension. 5. 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 Recommend annual imaging followup by CTA or MRA. This recommendation follows 2010 ACCF/AHA/AATS/ACR/ASA/SCA/SCAI/SIR/STS/SVM Guidelines for the Diagnosis and Management of Patients with Thoracic Aortic Disease. Circulation. 2010; 121: D638-V564. Aortic aneurysm NOS (ICD10-I71.9). 6. Aortic Atherosclerosis (ICD10-I70.0) and Emphysema (ICD10-J43.9). CT PERFUSION: 16 mL perfusion deficit involving the left cerebellar hemisphere, in keeping with the previously identified ischemic infarct. Critical Value/emergent results were called by telephone at the time of interpretation on 03/21/2021 at 10:30 pm to provider Lovey Newcomer , who verbally acknowledged these results. Electronically Signed   By: Jeannine Boga M.D.   On: 03/21/2021 22:48    Physical Exam   Constitutional: Appears well-developed and well-nourished.  Cardiovascular: Normal rate and regular rhythm.  Respiratory: Effort normal, non-labored breathing   Neuro: Mental Status: Patient is drowsy, awakens to voice oriented to person, place, states its  "1942" Falls asleep during exam, needs frequent encouragement to follow commands and answer questions  No signs of aphasia or neglect Cranial Nerves: II: Visual Fields are full. Pupils 97m, equal, round, and reactive to light.   III,IV, VI: EOMI without ptosis or diploplia.  V: Facial sensation is symmetric to temperature VII: Facial movement symmetry left facial droop VIII: Hearing is intact to voice X: Palate elevates symmetrically XI: Shoulder shrug is symmetric. XII: Tongue protrudes midline without atrophy or fasciculations.  Motor: Tone is normal. Bulk is normal.  Right side slightly weaker than left in upper extremities with proximal and distal strength. Hand grasp equal  Unable to hold legs bilaterally off bed. Can move them laterally off the pillow  Sensory: Sensation is symmetric to light touch and temperature in the arms and legs. No extinction to DSS present.  Cerebellar: FNF slowed in right hand with some dysmetria in left UE on FTN.   ASSESSMENT/PLAN Mr. JPearce Littlefieldis a 81y.o. male with history of diabetes type 2, hypertension, OSA, hyperlipidemia, history of a CVA presenting with nausea, vomiting, and diarrhea for the last few days.  Patient has had poor oral intake over the last ED days and has been more lethargic and less interactive.  He has been he has been having issues with nausea and vomiting for several months.  He was admitted initially with aspiration pneumonia, acute metabolic encephalopathy and recurrent nausea vomiting and diarrhea.  He underwent a endoscopy with GI that only showed mild gastritis.  He was a code stroke 3/11 for right-sided weakness.  Imaging showed acute infarcts in the left midbrain,  pons, and left cerebellum [facility]  Stroke:  Multiple small acute infarct involving the left cerebellar hemisphere secondary to unknown etiology  Code Stroke CT head - Subtle hypodensity involving the left midbrain, pons, and left cerebellum, consistent with the previously identified ischemic infarct, better seen on prior MRI. No associated hemorrhage or significant mass effect. CTA head & neck left  superior cerebellar artery occlusion CT perfusion 16 mL perfusion deficit involving the left cerebellar hemisphere, in keeping with the previously identified ischemic infarct. MRI  Multiple small and acute posterior circulation infarct involving the left cerebellar hemisphere and pons.  Possible small amount of acute ischemia at the anterior aspect of a chronic left parieto-occipital infarct. 2D Echo EF is 55-60%.  Left ventricle has normal function.  Left and right atrium normal in size LDL No results found for requested labs within last 26280 hours. HgbA1c 6.6 VTE prophylaxis -Lovenox    Diet   Diet full liquid Room service appropriate? Yes; Fluid consistency: Thin   aspirin 81 mg daily prior to admission, now on aspirin '81mg'$  and plavix '75mg'$  for 3 weeks and then plavix '75mg'$ .  Therapy recommendations: Pending Disposition: Pending  Hypertension Home meds:  lisinopril Stable Permissive hypertension (OK if < 220/120) but gradually normalize in 5-7 days Long-term BP goal normotensive  Hyperlipidemia Home meds:  atorvastatin '80mg'$ , resume when taking PO LDL No results found for requested labs within last 26280 hours., goal < 70 Continue statin at discharge  Other Stroke Risk Factors Advanced Age >/= 64  Obstructive sleep apnea  Other Active Problems Depression Home meds:lexapro '10mg'$  Gastritis Acute metabolic encephalopathy- resolved Aspiration Pneumonia CKD stage 3a  Hospital day # 3  Patient seen and examined by NP/APP with MD. MD  to update note as needed.   Janine Ores, DNP, FNP-BC [facility] Pager: [phone]  ATTENDING ATTESTATION:  81 year old with history of diabetes hypertension obstructive sleep apnea hyperlipidemia prior CVAs with underlying dementia.  Had nausea and vomiting which led to admission to [facility].  On MRI can have a left cerebellar pontine and midbrain stroke with left superior cerebellar artery occlusion.  Not a candidate for tPA or intervention.  His nephew is in the room with him who is the healthcare power of attorney.  He tells me he is a DNR/DNI.  He does not want to do any aggressive intervention.  He is okay with aspirin Plavix therapy.  He also tells me he has been having memory decline for the past several weeks to months.  He has an outpatient neurology appointment scheduled to evaluate for this further.  DAPT therapy as above.  Will need PT OT assessments.  Neurology will sign off.  Please call with questions  Dr. Reeves Forth evaluated pt independently, reviewed imaging, chart, labs. Discussed and formulated plan with the APP. Please see APP note above for details.   Total 36 minutes spent on counseling patient and coordinating care, writing notes and reviewing chart.   Genavieve Mangiapane,MD   To contact Stroke Continuity provider, please refer to [email or web address] After hours, contact General Neurology

## 2021-03-22 NOTE — Progress Notes (Signed)
Acute CVA noted.  I am signing off for now, please call or page if any further GI questions or concerns. ?

## 2021-03-22 NOTE — Progress Notes (Signed)
?  Echocardiogram ?2D Echocardiogram has been performed. ? Jesse Nunez ?03/22/2021, 12:01 PM ?

## 2021-03-22 NOTE — Progress Notes (Signed)
Pt taken to CT due to prior MRI results.  Pt able to answer simple questions and trying to follow commands, deficits noted.  Tele neuro evaluated pt. CBG complete, EKG  done and pt taken to CT scan stat.  [facility], NP aware and orders are to transfer pt to SD unit.  Unclear of when pt last known normal time.  See MD orders. [facility], NP following up with results and new orders.  Pt in room 1237. ?

## 2021-03-22 NOTE — Progress Notes (Signed)
Poor urine output overnight, only 150 ml in canister. Bladder scanned and 770m found in bladder. Patient verbalizes need to urinate, but does not seem to understand that he can urinate with external device in place. Patient moaning out "oh me, oh me".  ?

## 2021-03-23 ENCOUNTER — Encounter (HOSPITAL_COMMUNITY): Payer: Self-pay | Admitting: Gastroenterology

## 2021-03-23 DIAGNOSIS — R338 Other retention of urine: Secondary | ICD-10-CM | POA: Diagnosis present

## 2021-03-23 DIAGNOSIS — I719 Aortic aneurysm of unspecified site, without rupture: Secondary | ICD-10-CM | POA: Diagnosis present

## 2021-03-23 LAB — LIPID PANEL
Cholesterol: 136 mg/dL (ref 0–200)
HDL: 28 mg/dL — ABNORMAL LOW (ref >40)
LDL Cholesterol: 78 mg/dL (ref 0–99)
Total CHOL/HDL Ratio: 4.9 RATIO
Triglycerides: 152 mg/dL — ABNORMAL HIGH (ref <150)
VLDL: 30 mg/dL (ref 0–40)

## 2021-03-23 LAB — GLUCOSE, CAPILLARY
Glucose-Capillary: 131 mg/dL — ABNORMAL HIGH (ref 70–99)
Glucose-Capillary: 149 mg/dL — ABNORMAL HIGH (ref 70–99)
Glucose-Capillary: 151 mg/dL — ABNORMAL HIGH (ref 70–99)

## 2021-03-23 LAB — HEMOGLOBIN A1C
Hgb A1c MFr Bld: 7.3 % — ABNORMAL HIGH (ref 4.8–5.6)
Mean Plasma Glucose: 163 mg/dL

## 2021-03-23 LAB — SURGICAL PATHOLOGY

## 2021-03-23 MED ORDER — ZOLPIDEM TARTRATE 5 MG PO TABS: 5.0000 mg | ORAL_TABLET | Freq: Every evening | ORAL | 0 refills | Status: AC | PRN

## 2021-03-23 MED ORDER — VITAMIN D (ERGOCALCIFEROL) 1.25 MG (50000 UNIT) PO CAPS: 50000.0000 [IU] | ORAL_CAPSULE | ORAL | Status: AC

## 2021-03-23 MED ORDER — AMOXICILLIN-POT CLAVULANATE 875-125 MG PO TABS: 1.0000 | ORAL_TABLET | Freq: Two times a day (BID) | ORAL | 0 refills | Status: AC

## 2021-03-23 MED ORDER — CLOPIDOGREL BISULFATE 75 MG PO TABS: 75.0000 mg | ORAL_TABLET | Freq: Every day | ORAL | Status: AC

## 2021-03-23 NOTE — Progress Notes (Deleted)
Physical Therapy Treatment ?Patient Details ?Name: Jesse Nunez ?MRN: [id number] ?DOB: 06-Apr-1940 ?Today's Date: 03/23/2021 ? ? ?History of Present Illness Pt is an 81 y.o. male presenting with n/v and diarrhea for the last few days admitted with aspiration pneumonia, AKI, acute metabolic encephalopathy. He underwent a endoscopy with GI that only showed mild gastritis. code stroke on 3/11 for right-sided weakness.  Imaging showed acute infarcts in the left midbrain, pons, and left cerebellum. PMH significant for diabetes type 2, hypertension, OSA, hyperlipidemia, history of CVA, underlying dementia. ? ?  ?PT Comments  ? ? Pt progressing slowly toward goals.  Will downgrade goals to a more attainable level over the next 2 weeks.  Emphasis on transition, sitting balance/upright posture and sit to stand before transfer to the recliner.  Pt needing mod to max of 2 persons for most activity.   ?Recommendations for follow up therapy are one component of a multi-disciplinary discharge planning process, led by the attending physician.  Recommendations may be updated based on patient status, additional functional criteria and insurance authorization. ? ?Follow Up Recommendations ? Skilled nursing-short term rehab (<3 hours/day) ?  ?  ?Assistance Recommended at Discharge Frequent or constant Supervision/Assistance  ?Patient can return home with the following Two people to help with walking and/or transfers;Two people to help with bathing/dressing/bathroom;Assistance with cooking/housework;Direct supervision/assist for medications management;Direct supervision/assist for financial management;Assist for transportation;Help with stairs or ramp for entrance ?  ?Equipment Recommendations ? Rolling walker (2 wheels)  ?  ?Recommendations for Other Services   ? ? ?  ?Precautions / Restrictions Precautions ?Precautions: Fall ?Restrictions ?Weight Bearing Restrictions: No ?Other Position/Activity Restrictions: right side inattention  ?   ? ?Mobility ? Bed Mobility ?Overal bed mobility: Needs Assistance ?Bed Mobility: Supine to Sit, Sit to Supine ?  ?  ?Supine to sit: Max assist, HOB elevated, +2 for safety/equipment ?Sit to supine: Max assist, +2 for safety/equipment ?  ?General bed mobility comments: able to help advance RLE across bed with assist to unweight. Max assist to pivot hips to full EOB position and for truncal support. Multimodal cues throughout ?  ? ?Transfers ?Overall transfer level: Needs assistance ?Equipment used: Rolling walker (2 wheels) ?Transfers: Sit to/from Stand, Bed to chair/wheelchair/BSC ?Sit to Stand: Mod assist, +2 physical assistance, +2 safety/equipment, From elevated surface ?  ?  ?Squat pivot transfers: Max assist ?  ?  ?General transfer comment: Pt holding RLE in NWB position d/t severe knee pain. Successful on second standing trial utilizing momentum and bed under hips. Stood 1x briefly. Max to squat pivot into recliner. Will need Hoyer back to bed, ?  ? ?Ambulation/Gait ?  ?  ?  ?  ?  ?  ?  ?  ? ? ?Stairs ?  ?  ?  ?  ?  ? ? ?Wheelchair Mobility ?  ? ?Modified Rankin (Stroke Patients Only) ?  ? ? ?  ?Balance Overall balance assessment: Needs assistance ?Sitting-balance support: Single extremity supported ?Sitting balance-Leahy Scale: Zero ?Sitting balance - Comments: Requires a lot of physical assistance to maintain upright. Falling over to the left - at times can use LUE to prop himself ?Postural control: Left lateral lean ?Standing balance support: Bilateral upper extremity supported, During functional activity, Reliant on assistive device for balance ?Standing balance-Leahy Scale: Zero ?Standing balance comment: reliant on external support. Pt unable to tolerate WB through RLE ?  ?  ?  ?  ?  ?  ?  ?  ?  ?  ?  ?  ? ?  ?  Cognition Arousal/Alertness: Awake/alert ?Behavior During Therapy: Flat affect, Anxious ?Overall Cognitive Status: History of cognitive impairments - at baseline ?  ?  ?  ?  ?  ?  ?  ?  ?  ?  ?  ?   ?  ?  ?  ?  ?General Comments: acute on chronic cognitive deficits noted. focused attention. following one-step commands 50% of the time. limited verbalizations, answering yes/no questions only. "I don't know" ?  ?  ? ?  ?Exercises   ? ?  ?General Comments General comments (skin integrity, edema, etc.): Personal care attendent present during session. ?  ?  ? ?Pertinent Vitals/Pain Pain Assessment ?Pain Assessment: Faces ?Pain Location: R knee with motion ?Pain Descriptors / Indicators: Moaning, Grimacing, Guarding  ? ? ?Home Living   ?  ?  ?  ?  ?  ?  ?  ?  ?  ?   ?  ?Prior Function    ?  ?  ?   ? ?PT Goals (current goals can now be found in the care plan section) Acute Rehab PT Goals ?Patient Stated Goal: none stated by pt- Caregiver reports goals of being able to go back to independent living. Reports that family would also like that, no family present to confirm. ?PT Goal Formulation: Patient unable to participate in goal setting ?Time For Goal Achievement: 04/03/21 ?Potential to Achieve Goals: Fair ? ?  ?Frequency ? ? ? Min 3X/week ? ? ? ?  ?PT Plan    ? ? ?Co-evaluation PT/OT/SLP Co-Evaluation/Treatment: Yes ?Reason for Co-Treatment: Complexity of the patient's impairments (multi-system involvement) ?PT goals addressed during session: Mobility/safety with mobility;Strengthening/ROM ?OT goals addressed during session: ADL's and self-care;Strengthening/ROM ?  ? ?  ?AM-PAC PT "6 Clicks" Mobility   ?Outcome Measure ? Help needed turning from your back to your side while in a flat bed without using bedrails?: A Little ?Help needed moving from lying on your back to sitting on the side of a flat bed without using bedrails?: A Lot ?Help needed moving to and from a bed to a chair (including a wheelchair)?: Total ?Help needed standing up from a chair using your arms (e.g., wheelchair or bedside chair)?: Total ?Help needed to walk in hospital room?: Total ?Help needed climbing 3-5 steps with a railing? : Total ?6 Click  Score: 9 ? ?  ?End of Session   ?Activity Tolerance: Patient limited by pain ?Patient left: in chair;with call bell/phone within reach;with chair alarm set;with nursing/sitter in room ?Nurse Communication: Mobility status ?PT Visit Diagnosis: Unsteadiness on feet (R26.81);Muscle weakness (generalized) (M62.81);Difficulty in walking, not elsewhere classified (R26.2);Other abnormalities of gait and mobility (R26.89) ?  ? ? ?Time: 5929-2446 ?PT Time Calculation (min) (ACUTE ONLY): 38 min ? ?Charges:             ?          ? ?03/23/2021 ? ?Ginger Carne., PT ?Acute Rehabilitation Services ?[phone]  (pager) ?[phone]  (office) ? ? ?Tessie Fass Ymani Porcher ?03/23/2021, 12:14 PM ? ?

## 2021-03-23 NOTE — Care Management Important Message (Signed)
Important Message ? ?Patient Details  ?Name: Jesse Nunez ?MRN: [id number] ?Date of Birth: 03/29/1940 ? ? ?Medicare Important Message Given:  Yes ? ? ? ? ?Briston Lax ?03/23/2021, 4:00 PM ?

## 2021-03-23 NOTE — Discharge Summary (Addendum)
Physician Discharge Summary  Jesse Nunez ZOX:[id number] DOB: July 10, 1940 DOA: 03/18/2021  PCP: [facility]  Admit date: 03/18/2021 Discharge date: 03/23/2021  Admitted From: Home Disposition: SNF  Recommendations for Outpatient Follow-up:  Follow up with PCP in 1-2 weeks Continue Augmentin to complete antibiotic course for multifocal/aspiration pneumonia Recommend repeat BMP 1 week to ensure renal function remained stable We will need annual surveillance imaging of aortic aneurysm measuring 4.3 cm We will need repeat voiding trial for acute urinary retention versus outpatient follow-up with urology. Continue outpatient goals of care discussions  Discharge Condition: Stable CODE STATUS: DNR Diet recommendation:  Diet recommendations: Consistent carbohydrate/heart healthy diet;Thin liquid Liquids provided via: Straw Medication Administration: Whole meds with puree Supervision: Staff to assist with self feeding Compensations: Slow rate;Small sips/bites Postural Changes and/or Swallow Maneuvers: Seated upright 90 degrees          History of present illness:  Jesse Nunez is a 81 year old male with past medical history significant for diabetes type 2, hypertension, OSA, hyperlipidemia, history of CVA who presents for evaluation of nausea vomiting and diarrhea for the last few days.  Patient has had poor oral intake over the last few days.  He has been more lethargic and less interactive for the last 2 days.  He has been having issues with nausea and vomiting for the last several months.  Patient was initially admitted to [facility] long hospital for aspiration ammonia and started on empiric antibiotics with Unasyn.  Patient also with recurrent nausea/vomiting diarrhea and underwent endoscopy by [facility] notable for mild gastritis.  Patient was also noted to have right arm weakness on 3/10 and patient was transferred to [facility] on 3/11 for neurology and stroke team  evaluation.  Hospital course:  Assessment and Plan: * Acute stroke due to ischemia [facility]) Patient presented with AMS, generalized weakness, as he became more alert right arm deficit was noticed.  Neurology was consulted and patient was only transferred to [facility] for stroke evaluation.  CT head without contrast with hypodensity left midbrain, pons and left cerebellum region.  CT angiogram head/neck with occlusion left superior cerebral artery otherwise no other acute large vessel occlusion.  MR brain with multiple small and acute posterior circulation infarcts involving the left cerebellar hemisphere and pons, possible small amount of acute ischemia anterior aspect of a chronic left parieto-occipital infarct.  LDL 78, hemoglobin A1c 6.6.  TTE with LVEF 55 to 60%, no LV regional wall motion normalities, moderate AAS, IVC normal.  Was not a candidate for tPA or intervention; and declines any aggressive intervention.  Neurology recommended continue aspirin 81 mg p.o. daily, Plavix 75 mg p.o. daily.  PT/OT evaluation with recommendation of SNF placement.  Discharging to SNF.  Outpatient follow-up with neurology 4-6 weeks.    Multifocal pneumonia Presents with vomiting, CT chest ; showed bilateral lower lobe opacity denser on the left consistent with multilobar pneumonia or aspiration.  He also presented with worsening altered mental status.  Patient was started on IV Unasyn and transitioned to Augmentin at time of discharge to complete antibiotic course.  Seen by speech therapy with recommendations of thin liquids, aspiration precautions.  Acute metabolic encephalopathy Suspect in the setting of infection, aspiration pneumonia, some underlying dementia, and acute CVA.  He has been declining per nephew over last few months. As he became more alert, right side deficit was notice more. MRI positive for stroke. Ammonia: 12, B:12: 273, vitamin D 23, TSH 0.75 , RPR Non reactive. Blood gas  negative for  hypercapnia.  Recommend continued goals of care discussions outpatient.   Diarrhea Diarrhea has resolved.  CKD (chronic kidney disease) stage 3, GFR 30-59 ml/min (HCC) Baseline creatinine 1.4-1.6.  Creatinine on discharge 1.35.  Continue to encourage increased oral intake.  Type 2 diabetes mellitus with hyperglycemia (HCC) Hemoglobin A1c 6.6, well controlled.  Diet controlled.  Prolonged QT interval Avoid medications which could further prolong QT interval  Dehydration Metabolic acidosis resolved with IV fluid hydration.  Continue encourage increase oral intake.  Aortic aneurysm (HCC) CT angiogram head/neck with incidental finding of 4.3 cm ascending aorta dilation.  Recommend annual imaging followed by CTA or MRA.  Acute urinary retention Attempted voiding trial while inpatient, failed.  Continue Foley catheter on discharge.  Continue tamsulosin 0.4 mg p.o. daily.  Recommend outpatient follow-up with urology or another attempt at voiding trial.  Vitamin D deficiency Started oral supplement.   Essential hypertension Lisinopril 10 mg p.o. daily  Nausea & vomiting He has been having issues with nausea, vomiting for couple of month cyclic. CT abdomen and pelvis showed; fold thickening of the stomach consistent with gastritis, and gastric fluid distention which could be due to recent ingestion or impaired gastric emptying.  Seen by gastroenterology and underwent EGD with mild gastritis as finding.  Continue Zofran as needed.        Discharge Diagnoses:  Principal Problem:   Acute stroke due to ischemia [facility]) Active Problems:   Multifocal pneumonia   Diarrhea   Acute metabolic encephalopathy   CKD (chronic kidney disease) stage 3, GFR 30-59 ml/min (HCC)   Type 2 diabetes mellitus with hyperglycemia (HCC)   Dehydration   Prolonged QT interval   Nausea & vomiting   Essential hypertension   Vitamin D deficiency   Acute urinary retention   Aortic aneurysm  [facility])    Discharge Instructions  Discharge Instructions     Ambulatory referral to Neurology   Complete by: As directed    An appointment is requested in approximately: 4 weeks   Call MD for:  difficulty breathing, headache or visual disturbances   Complete by: As directed    Call MD for:  extreme fatigue   Complete by: As directed    Call MD for:  persistant dizziness or light-headedness   Complete by: As directed    Call MD for:  persistant nausea and vomiting   Complete by: As directed    Call MD for:  severe uncontrolled pain   Complete by: As directed    Call MD for:  temperature >100.4   Complete by: As directed    Diet - low sodium heart healthy   Complete by: As directed    Increase activity slowly   Complete by: As directed       Allergies as of 03/23/2021       Reactions   Glipizide Nausea And Vomiting   Pt reported it being a new prescription and took 1 dose for 01/05/20 and had several episodes of vomiting. Pt went to the ER.        Medication List     STOP taking these medications    doxycycline 100 MG tablet Commonly known as: VIBRA-TABS   predniSONE 20 MG tablet Commonly known as: DELTASONE       TAKE these medications    acetaminophen 500 MG tablet Commonly known as: TYLENOL Take 500 mg by mouth every 6 (six) hours as needed for moderate pain.   albuterol 108 (90 Base) MCG/ACT inhaler  Commonly known as: VENTOLIN HFA Inhale 2 puffs into the lungs every 6 (six) hours as needed for wheezing or shortness of breath.   amoxicillin-clavulanate 875-125 MG tablet Commonly known as: Augmentin Take 1 tablet by mouth 2 (two) times daily for 2 days.   aspirin EC 81 MG tablet Take 81 mg by mouth daily. Swallow whole.   atorvastatin 80 MG tablet Commonly known as: LIPITOR Take 80 mg by mouth at bedtime.   clopidogrel 75 MG tablet Commonly known as: PLAVIX Take 1 tablet (75 mg total) by mouth daily. Start taking on: March 24, 2021    escitalopram 10 MG tablet Commonly known as: LEXAPRO Take 10 mg by mouth daily.   fluticasone 50 MCG/ACT nasal spray Commonly known as: FLONASE Place 2 sprays into both nostrils daily as needed for allergies.   lisinopril 10 MG tablet Commonly known as: ZESTRIL Take 10 mg by mouth daily.   multivitamin with minerals Tabs tablet Take 1 tablet by mouth daily.   NUTRITIONAL SUPPLEMENTS PO Take 237 mLs by mouth daily as needed (for additional calories).   omeprazole 20 MG capsule Commonly known as: PRILOSEC Take 40 mg by mouth daily.   ondansetron 4 MG disintegrating tablet Commonly known as: ZOFRAN-ODT Take 4 mg by mouth daily as needed for nausea.   tamsulosin 0.4 MG Caps capsule Commonly known as: FLOMAX Take 0.4 mg by mouth at bedtime.   Trelegy Ellipta 100-62.5-25 MCG/ACT Aepb Generic drug: Fluticasone-Umeclidin-Vilant Inhale 1 puff into the lungs daily.   Vitamin D (Ergocalciferol) 1.25 MG (50000 UNIT) Caps capsule Commonly known as: DRISDOL Take 1 capsule (50,000 Units total) by mouth every 7 (seven) days. Start taking on: March 27, 2021   zolpidem 5 MG tablet Commonly known as: AMBIEN Take 1 tablet (5 mg total) by mouth at bedtime as needed (sleep).        Contact information for follow-up providers     [facility] Schedule an appointment as soon as possible for a visit in 1 week(s).   Specialty: Gastroenterology Contact information: [address] [place] [address] [phone]              Contact information for after-discharge care     Destination     [facility] Preferred SNF .   Service: Skilled Nursing Contact information: [place] [address] [phone]                    Allergies  Allergen Reactions   Glipizide Nausea And Vomiting    Pt reported it being a new prescription and took 1 dose for 01/05/20 and had several episodes of  vomiting. Pt went to the ER.    Consultations: [facility] Neurology   Procedures/Studies: CT Head Wo Contrast  Result Date: 03/19/2021 CLINICAL DATA:  Neuro deficit, acute, stroke suspected Dementia patient with weakness, nausea and vomiting. EXAM: CT HEAD WITHOUT CONTRAST TECHNIQUE: Contiguous axial images were obtained from the base of the skull through the vertex without intravenous contrast. RADIATION DOSE REDUCTION: This exam was performed according to the departmental dose-optimization program which includes automated exposure control, adjustment of the mA and/or kV according to patient size and/or use of iterative reconstruction technique. COMPARISON:  Head CT and brain MRI 04/11/2020 FINDINGS: Brain: No acute intracranial hemorrhage. Similar degree of atrophy and chronic small vessel ischemic change. There are remote lacunar infarcts within the bilateral basal ganglia and thalami. Normal bilateral lacunar cerebellar infarcts. Unchanged left  occipital infarct from prior exam. There is no CT evidence of acute ischemia. No hydrocephalus. No subdural or extra-axial collection. Vascular: Atherosclerosis of skullbase vasculature without hyperdense vessel or abnormal calcification. Skull: No fracture or focal lesion. Sinuses/Orbits: Mucous retention cysts in both maxillary sinuses. Occasional opacification of ethmoid air cells. No mastoid effusion. Other: None. IMPRESSION: 1. No acute intracranial abnormality. 2. Unchanged atrophy, chronic small vessel ischemic change, and remote infarcts since April of 2022. Electronically Signed   By: Keith Rake M.D.   On: 03/19/2021 00:30   MR BRAIN WO CONTRAST  Result Date: 03/21/2021 CLINICAL DATA:  Neuro deficit, acute, stroke suspected. Encephalopathy. EXAM: MRI HEAD WITHOUT CONTRAST TECHNIQUE: Multiplanar, multiecho pulse sequences of the brain and surrounding structures were obtained without intravenous contrast. COMPARISON:  Head CT  03/19/2021 and MRI 04/11/2020 FINDINGS: Brain: There are multiple small acute infarcts involving the peripheral and deep aspect of the left cerebellar hemisphere, left middle cerebellar peduncle, and left pons. A chronic left parieto-occipital infarct is larger than on the prior MRI, and there are associated chronic blood products. There is a 1 cm focus of restricted diffusion at the anterior aspect of the chronic left parieto-occipital infarct which abuts the atrium of the left lateral ventricle and could reflect a small amount of superimposed acute ischemia in this region. There is a small chronic right occipital infarct which is new or larger than on the prior MRI. A chronic right temporal infarct is unchanged. Patchy T2 hyperintensities in the cerebral white matter bilaterally have slightly progressed from prior MRI and are nonspecific but compatible with moderately extensive chronic small vessel ischemic disease. Multiple chronic lacunar infarcts are present in the basal ganglia bilaterally, thalami, brainstem, and cerebellum, progressed from the prior MRI and the brainstem. There is moderate cerebral atrophy. No mass, midline shift, or extra-axial fluid collection is identified. A small chronic hemorrhage is noted in the right parietal lobe. Vascular: Chronically abnormal appearance of the distal right vertebral artery which is small and likely chronically occluded. Other major intracranial vascular flow voids are preserved. Skull and upper cervical spine: Unremarkable bone marrow signal. Sinuses/Orbits: Unremarkable orbits. Mucous retention cyst in the left maxillary sinus. Trace left mastoid effusion. Other: None. IMPRESSION: 1. Multiple small acute posterior circulation infarcts involving the left cerebellar hemisphere and pons. 2. Possible small amount of acute ischemia at the anterior aspect of a chronic left parieto-occipital infarct. 3. Extensive chronic small vessel ischemia with numerous chronic  lacunar infarcts, progressed from 04/2020. Electronically Signed   By: Logan Bores M.D.   On: 03/21/2021 19:12   CT ABDOMEN PELVIS W CONTRAST  Result Date: 03/19/2021 CLINICAL DATA:  Nausea, vomiting and weakness. EXAM: CT ABDOMEN AND PELVIS WITH CONTRAST TECHNIQUE: Multidetector CT imaging of the abdomen and pelvis was performed using the standard protocol following bolus administration of intravenous contrast. RADIATION DOSE REDUCTION: This exam was performed according to the departmental dose-optimization program which includes automated exposure control, adjustment of the mA and/or kV according to patient size and/or use of iterative reconstruction technique. CONTRAST:  183m OMNIPAQUE IOHEXOL 300 MG/ML  SOLN COMPARISON:  CT with IV and oral contrast 05/08/2020, chest CT no contrast 07/04/2013. FINDINGS: Lower chest: The cardiac size is normal. There calcifications in the aortic valve annulus, circumflex and right coronary arteries. Small hiatal hernia. Minimal left pleural effusion. There is increased airspace consolidation in the basal segments of both lower lobes, denser on the left and most likely due to pneumonia or aspiration. More anteriorly the remaining lung  bases are clear. Factors affecting quality in the abdomen: Streak artifact from the patient's arms, and artifact due to respiratory motion. Hepatobiliary: 18.5 cm length mildly steatotic liver. No mass enhancement. 1.5 cm cyst in the dome of the right lobe in the anterior segment is again shown. Gallbladder contains at least 1 small stone, possibly two but there is no wall thickening or biliary dilatation. No portal vein dilatation. Pancreas: Unremarkable. Spleen: Normal in size. Few tiny hypodensities are again noted but unchanged. No splenomegaly. Adrenals/Urinary Tract: There is no adrenal mass , no renal cortical mass , no evidence of urinary stones or obstruction. There are renovascular calcifications at the renal hila. Moderate bladder  distention is seen with the dome reaching the level of L4-5. There is no wall thickening. Stomach/Bowel: Stomach is distended with fluid and there is fold thickening without inflammatory change. The unopacified small bowel is unremarkable. The appendix again noted surgically absent. There is colonic diverticulosis greatest along the left colon without evidence of acute diverticulitis or wall thickening. Vascular/Lymphatic: Heavy aortoiliac atherosclerosis. No AAA. No adenopathy. Reproductive: No prostatomegaly. Other: Small umbilical and inguinal fat hernias. There is no free air, hemorrhage or fluid. Musculoskeletal: There are chronic compression fractures, mild at T12, L2, and more prominently at L3 along the lower plate where there is posterior retropulsion moderately narrowing the spinal canal. This was seen previously and unchanged. L3 vertebral height loss remains at 50%. There is osteopenia and degenerative change. There is unilateral ankylosis across the left SI joint. IMPRESSION: 1. Bilateral lower lobe opacities, denser on the left consistent with multilobar pneumonia or aspiration. Minimal left pleural effusion. Follow-up CT recommended after treatment to ensure clearing. 2. No acute abnormality demonstrated in the abdomen or pelvis. 3. Cholelithiasis. 4. Aortic atherosclerosis. 5. Bladder distention without wall thickening. No significant prostate enlargement. 6. Advanced diverticulosis without evidence of diverticulitis. 7. Fold thickening in the stomach consistent with gastritis, and gastric fluid distention which could be due to recent ingestion or impaired gastric emptying. 8. Remaining findings described above including chronic L3 moderate compression fracture from the lower plate with stenosing retropulsion. Electronically Signed   By: Telford Nab M.D.   On: 03/19/2021 00:51   ECHOCARDIOGRAM COMPLETE  Result Date: 03/22/2021    ECHOCARDIOGRAM REPORT   Patient Name:   Jesse Nunez Date of  Exam: 03/22/2021 Medical Rec #:  [id number]   Height:       71.0 in Accession #:    [id number]  Weight:       166.4 lb Date of Birth:  [birth year]    BSA:          1.950 m Patient Age:    64 years    BP:           154/75 mmHg Patient Gender: M           HR:           55 bpm. Exam Location:  Inpatient Procedure: 2D Echo, Cardiac Doppler and Color Doppler Indications:    Stroke I63.9  History:        Patient has prior history of Echocardiogram examinations, most                 recent 07/04/2013. Risk Factors:Hypertension, Dyslipidemia and                 Sleep Apnea. Aspiration pneumonia, acute metabolic                 encephalopathy. Chronic kidney  disease.  Sonographer:    Darlina Sicilian RDCS Referring Phys: [phone] [facility]  Sonographer Comments: Suboptimal parasternal window and suboptimal apical window. IMPRESSIONS  1. Left ventricular ejection fraction, by estimation, is 55 to 60%. The left ventricle has normal function. The left ventricle has no regional wall motion abnormalities. Left ventricular diastolic parameters are indeterminate.  2. Right ventricular systolic function is normal. The right ventricular size is normal.  3. The mitral valve is normal in structure. No evidence of mitral valve regurgitation.  4. AV is thickened, calcified DIffiuclt to see well Peak and mean gradients through the vlae are 41 nad 18 mm Hg repectively AVA (VTI) is 1 cm2. Dimensionless indext is o.29 consistet with moderate AS Note SVI is 36 which may explain lower gradients. Compared to echo report from 2018, mean graident is mildly increased (12 to 18 mm Hg) . Aortic valve regurgitation is mild.  5. The inferior vena cava is normal in size with greater than 50% respiratory variability, suggesting right atrial pressure of 3 mmHg. FINDINGS  Left Ventricle: Left ventricular ejection fraction, by estimation, is 55 to 60%. The left ventricle has normal function. The left ventricle has no regional wall motion abnormalities. The  left ventricular internal cavity size was normal in size. There is  no left ventricular hypertrophy. Left ventricular diastolic parameters are indeterminate. Right Ventricle: The right ventricular size is normal. Right vetricular wall thickness was not assessed. Right ventricular systolic function is normal. Left Atrium: Left atrial size was normal in size. Right Atrium: Right atrial size was normal in size. Pericardium: There is no evidence of pericardial effusion. Mitral Valve: The mitral valve is normal in structure. No evidence of mitral valve regurgitation. Tricuspid Valve: The tricuspid valve is normal in structure. Tricuspid valve regurgitation is mild. Aortic Valve: AV is thickened, calcified DIffiuclt to see well Peak and mean gradients through the vlae are 41 nad 18 mm Hg repectively AVA (VTI) is 1 cm2. Dimensionless indext is o.29 consistet with moderate AS Note SVI is 36 which may explain lower gradients. Compared to echo report from 2018, mean graident is mildly increased (12 to 18 mm Hg). Aortic valve regurgitation is mild. Aortic valve mean gradient measures 15.3 mmHg. Aortic valve peak gradient measures 32.8 mmHg. Aortic valve area, by VTI measures 1.10 cm. Pulmonic Valve: The pulmonic valve was grossly normal. Pulmonic valve regurgitation is trivial. Aorta: The aortic root is normal in size and structure. Venous: The inferior vena cava is normal in size with greater than 50% respiratory variability, suggesting right atrial pressure of 3 mmHg. IAS/Shunts: No atrial level shunt detected by color flow Doppler.  LEFT VENTRICLE PLAX 2D LVIDd:         5.50 cm      Diastology LVIDs:         3.90 cm      LV e' medial:    4.22 cm/s LV PW:         0.90 cm      LV E/e' medial:  9.5 LV IVS:        0.90 cm      LV e' lateral:   4.30 cm/s LVOT diam:     2.20 cm      LV E/e' lateral: 9.4 LV SV:         70 LV SV Index:   36 LVOT Area:     3.80 cm  LV Volumes (MOD) LV vol d, MOD A2C: 74.6 ml LV vol d, MOD  A4C:  147.0 ml LV vol s, MOD A2C: 43.8 ml LV vol s, MOD A4C: 52.1 ml LV SV MOD A2C:     30.8 ml LV SV MOD A4C:     147.0 ml LV SV MOD BP:      64.9 ml RIGHT VENTRICLE RV S prime:     9.49 cm/s LEFT ATRIUM             Index LA diam:        3.70 cm 1.90 cm/m LA Vol (A2C):   25.6 ml 13.13 ml/m LA Vol (A4C):   43.0 ml 22.05 ml/m LA Biplane Vol: 36.2 ml 18.56 ml/m  AORTIC VALVE AV Area (Vmax):    1.01 cm AV Area (Vmean):   1.13 cm AV Area (VTI):     1.10 cm AV Vmax:           286.33 cm/s AV Vmean:          176.000 cm/s AV VTI:            0.637 m AV Peak Grad:      32.8 mmHg AV Mean Grad:      15.3 mmHg LVOT Vmax:         76.40 cm/s LVOT Vmean:        52.200 cm/s LVOT VTI:          0.185 m LVOT/AV VTI ratio: 0.29  AORTA Ao Root diam: 3.40 cm MITRAL VALVE MV Area (PHT): 2.11 cm    SHUNTS MV Decel Time: 360 msec    Systemic VTI:  0.18 m MV E velocity: 40.30 cm/s  Systemic Diam: 2.20 cm MV A velocity: 73.70 cm/s MV E/A ratio:  0.55 Dorris Carnes MD Electronically signed by Dorris Carnes MD Signature Date/Time: 03/22/2021/1:07:37 PM    Final    CT HEAD CODE STROKE WO CONTRAST`  Result Date: 03/21/2021 CLINICAL DATA:  Initial evaluation for neuro deficit, stroke suspected. EXAM: CT ANGIOGRAPHY HEAD AND NECK CT PERFUSION BRAIN TECHNIQUE: Multidetector CT imaging of the head and neck was performed using the standard protocol during bolus administration of intravenous contrast. Multiplanar CT image reconstructions and MIPs were obtained to evaluate the vascular anatomy. Carotid stenosis measurements (when applicable) are obtained utilizing NASCET criteria, using the distal internal carotid diameter as the denominator. Multiphase CT imaging of the brain was performed following IV bolus contrast injection. Subsequent parametric perfusion maps were calculated using RAPID software. RADIATION DOSE REDUCTION: This exam was performed according to the departmental dose-optimization program which includes automated exposure control,  adjustment of the mA and/or kV according to patient size and/or use of iterative reconstruction technique. CONTRAST:  159m OMNIPAQUE IOHEXOL 350 MG/ML SOLN COMPARISON:  Prior MRI from earlier the same day. FINDINGS: CT HEAD FINDINGS Brain: Moderately advanced cerebral atrophy. 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 Chronic encephalomalacia at the left parieto-occipital region consistent with a chronic ischemic infarct. Vague hypodensity involving the left midbrain and pons as well as the left cerebellum, consistent with previously identified infarct, better seen on prior MRI. No associated hemorrhage or significant mass effect. No other acute large vessel territory infarct. No mass lesion or midline shift. No hydrocephalus or extra-axial fluid collection. Vascular: No hyperdense vessel. Calcified atherosclerosis present at the skull base. Skull: Scalp soft tissues and calvarium within normal limits. Sinuses/Orbits: Globes orbital soft tissues demonstrate no acute finding. Scattered mucosal thickening noted within the sphenoid ethmoidal and maxillary sinuses, with a  few small retention cyst noted. No significant mastoid effusion. Other: None. 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 Moderate atheromatous change about the visualized intrathoracic aorta. Normal branch pattern seen at the aortic arch. No stenosis about the origin of the great vessels. Right carotid system: Right common and internal carotid arteries are tortuous but patent without significant stenosis or dissection. Mild plaque about  the right carotid bulb without stenosis. Left carotid system: Left common and internal carotid arteries tortuous without stenosis or dissection. Mild atheromatous change about the left carotid bulb without stenosis. Vertebral arteries: Both vertebral arteries arise from the subclavian arteries. No significant proximal subclavian artery stenosis. Strongly dominant left vertebral artery widely patent. Right vertebral artery markedly hypoplastic with intermittently attenuated flow, but remains grossly patent to the skull base. No dissection. Skeleton: No discrete or worrisome osseous lesions. Mild-to-moderate multilevel cervical spondylosis, most pronounced at C5-6 and C6-7. Other neck: No other acute soft tissue abnormality within the neck. Small amount of secretions noted within the subglottic trachea. Upper chest: Probable small pericardial effusion partially visualized. Emphysema. Visualized upper chest demonstrates no other acute finding. Review of the MIP images confirms the above findings CTA HEAD FINDINGS Anterior circulation: Petrous segments patent bilaterally. Atheromatous change throughout the carotid siphons without significant stenosis. A1 segments patent bilaterally. Normal anterior communicating artery complex. Atheromatous irregularity throughout the ACAs without high-grade stenosis. Right M1 segment widely patent. Left M1 segment smoothly tapers towards its distal aspect, with short-segment severe proximal left M2 stenosis (series 10, image 63). No proximal MCA branch occlusion. Extensive small vessel atheromatous irregularity throughout the MCA branches bilaterally. Posterior circulation: Strongly dominant left vertebral artery widely patent to the vertebrobasilar junction. Left PICA patent. Hypoplastic right vertebral artery remains patent to the vertebrobasilar junction. Right PICA not seen. Basilar patent to its distal aspect without stenosis. Left superior cerebellar artery not seen, likely  occluded given the acute ischemic changes. Right SCA patent. Both PCAs primarily supplied via the basilar. Extensive atheromatous irregularity throughout the PCAs bilaterally with associated moderate to severe proximal left P2 stenoses (series 10, image 62). PCAs remain patent to their distal aspects. Venous sinuses: Patent allowing for timing the contrast bolus. Anatomic variants: Strongly dominant left vertebral artery. No aneurysm. Review of the MIP images confirms the above findings CT Brain Perfusion Findings: ASPECTS: 10. CBF (<30%) Volume: 66m Perfusion (Tmax>6.0s) volume: 162mMismatch Volume: 1628mnfarction Location:Decreased perfusion seen within the left cerebellar hemisphere, in keeping with the previously identified ischemic infarct. No other perfusion abnormality. IMPRESSION: CT HEAD: 1. Subtle hypodensity involving the left midbrain, pons, and left cerebellum, consistent with the previously identified ischemic infarct, better seen on prior MRI. No associated hemorrhage or significant mass effect. 2. Aspects = 10. 3. Advanced cerebral atrophy with extensive chronic ischemic changes, stable. CTA HEAD AND NECK: 1. 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 2. No other acute large vessel occlusion or other emergent finding. 3. Advanced intracranial atherosclerotic disease with associated short-segment moderate to severe proximal left M2 and left P2 stenoses. 4. Diffuse tortuosity of the major arterial vasculature of the head and neck, suggesting chronic underlying hypertension. 5. 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 Recommend annual imaging followup by CTA or MRA. This recommendation follows 2010 ACCF/AHA/AATS/ACR/ASA/SCA/SCAI/SIR/STS/SVM Guidelines for the Diagnosis and Management of Patients with Thoracic Aortic Disease. Circulation. 2010; 121: C789-F810. Aortic aneurysm NOS (ICD10-I71.9). 6. Aortic Atherosclerosis  (ICD10-I70.0) and Emphysema (ICD10-J43.9). CT PERFUSION: 16 mL perfusion deficit involving the left cerebellar hemisphere, in keeping with the previously identified ischemic infarct. Critical Value/emergent results were called by telephone at the time of interpretation on 03/21/2021 at 10:30 pm to provider Lovey Newcomer , who verbally acknowledged these results. Electronically Signed   By: Jeannine Boga M.D.   On: 03/21/2021 22:48   CT ANGIO HEAD NECK W WO CM W PERF (CODE STROKE)  Result Date: 03/21/2021 CLINICAL DATA:  Initial evaluation for neuro deficit, stroke suspected. EXAM: CT ANGIOGRAPHY HEAD AND NECK CT PERFUSION BRAIN TECHNIQUE: Multidetector CT imaging of the head and neck was performed using the standard protocol during bolus administration of intravenous contrast. Multiplanar CT image reconstructions and MIPs were obtained to evaluate the vascular anatomy. Carotid stenosis measurements (when applicable) are obtained utilizing NASCET criteria, using the distal internal carotid diameter as the denominator. Multiphase CT imaging of the brain was performed following IV bolus contrast injection. Subsequent parametric perfusion maps were calculated using RAPID software. RADIATION DOSE REDUCTION: This exam was performed according to the departmental dose-optimization program which includes automated exposure control, adjustment of the mA and/or kV according to patient size and/or use of iterative reconstruction technique. CONTRAST:  190m OMNIPAQUE IOHEXOL 350 MG/ML SOLN COMPARISON:  Prior MRI from earlier the same day. FINDINGS: CT HEAD FINDINGS Brain: Moderately advanced cerebral atrophy. 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 Chronic encephalomalacia at the left parieto-occipital region consistent with a chronic ischemic infarct. Vague hypodensity involving the left midbrain and pons as well as the left  cerebellum, consistent with previously identified infarct, better seen on prior MRI. No associated hemorrhage or significant mass effect. No other acute large vessel territory infarct. No mass lesion or midline shift. No hydrocephalus or extra-axial fluid collection. Vascular: No hyperdense vessel. Calcified atherosclerosis present at the skull base. Skull: Scalp soft tissues and calvarium within normal limits. Sinuses/Orbits: Globes orbital soft tissues demonstrate no acute finding. Scattered mucosal thickening noted within the sphenoid ethmoidal and maxillary sinuses, with a few small retention cyst noted. No significant mastoid effusion. Other: None. ASPECTS ([facility] Early CT Score) - Ganglionic level infarction (caudate, lentiform nuclei, internal capsule, insula, M1-M3 cortex): 7 - Supraganglionic infarction (M4-M6 cortex): 3 Total score (0-10 with 10 being normal): 10 Review of the MIP images confirms the above findings CTA NECK FINDINGS Aortic arch: Visualized ascending aorta dilated up to 4.3 cm. Moderate atheromatous change about the visualized intrathoracic aorta. Normal branch pattern seen at the aortic arch. No stenosis about the origin of the great vessels. Right carotid system: Right common and internal carotid arteries are tortuous but patent without significant stenosis or dissection. Mild plaque about the right carotid bulb without stenosis. Left carotid system: Left common and internal carotid arteries tortuous without stenosis or dissection. Mild atheromatous change about the left carotid bulb without stenosis. Vertebral arteries: Both vertebral arteries arise from the subclavian arteries. No significant proximal subclavian artery stenosis. Strongly dominant left vertebral artery widely patent. Right vertebral artery markedly hypoplastic with intermittently attenuated flow, but remains grossly patent to the skull base. No dissection. Skeleton: No discrete or worrisome osseous  lesions. Mild-to-moderate multilevel cervical spondylosis, most pronounced at C5-6 and  C6-7. Other neck: No other acute soft tissue abnormality within the neck. Small amount of secretions noted within the subglottic trachea. Upper chest: Probable small pericardial effusion partially visualized. Emphysema. Visualized upper chest demonstrates no other acute finding. Review of the MIP images confirms the above findings CTA HEAD FINDINGS Anterior circulation: Petrous segments patent bilaterally. Atheromatous change throughout the carotid siphons without significant stenosis. A1 segments patent bilaterally. Normal anterior communicating artery complex. Atheromatous irregularity throughout the ACAs without high-grade stenosis. Right M1 segment widely patent. Left M1 segment smoothly tapers towards its distal aspect, with short-segment severe proximal left M2 stenosis (series 10, image 63). No proximal MCA branch occlusion. Extensive small vessel atheromatous irregularity throughout the MCA branches bilaterally. Posterior circulation: Strongly dominant left vertebral artery widely patent to the vertebrobasilar junction. Left PICA patent. Hypoplastic right vertebral artery remains patent to the vertebrobasilar junction. Right PICA not seen. Basilar patent to its distal aspect without stenosis. Left superior cerebellar artery not seen, likely occluded given the acute ischemic changes. Right SCA patent. Both PCAs primarily supplied via the basilar. Extensive atheromatous irregularity throughout the PCAs bilaterally with associated moderate to severe proximal left P2 stenoses (series 10, image 62). PCAs remain patent to their distal aspects. Venous sinuses: Patent allowing for timing the contrast bolus. Anatomic variants: Strongly dominant left vertebral artery. No aneurysm. Review of the MIP images confirms the above findings CT Brain Perfusion Findings: ASPECTS: 10. CBF (<30%) Volume: 84m Perfusion (Tmax>6.0s) volume: 168m Mismatch Volume: 1624mnfarction Location:Decreased perfusion seen within the left cerebellar hemisphere, in keeping with the previously identified ischemic infarct. No other perfusion abnormality. IMPRESSION: CT HEAD: 1. Subtle hypodensity involving the left midbrain, pons, and left cerebellum, consistent with the previously identified ischemic infarct, better seen on prior MRI. No associated hemorrhage or significant mass effect. 2. Aspects = 10. 3. Advanced cerebral atrophy with extensive chronic ischemic changes, stable. CTA HEAD AND NECK: 1. 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 2. No other acute large vessel occlusion or other emergent finding. 3. Advanced intracranial atherosclerotic disease with associated short-segment moderate to severe proximal left M2 and left P2 stenoses. 4. Diffuse tortuosity of the major arterial vasculature of the head and neck, suggesting chronic underlying hypertension. 5. 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 Recommend annual imaging followup by CTA or MRA. This recommendation follows 2010 ACCF/AHA/AATS/ACR/ASA/SCA/SCAI/SIR/STS/SVM Guidelines for the Diagnosis and Management of Patients with Thoracic Aortic Disease. Circulation. 2010; 121: E: [id number] aneurysm NOS (ICD10-I71.9). 6. Aortic Atherosclerosis (ICD10-I70.0) and Emphysema (ICD10-J43.9). CT PERFUSION: 16 mL perfusion deficit involving the left cerebellar hemisphere, in keeping with the previously identified ischemic infarct. Critical Value/emergent results were called by telephone at the time of interpretation on 03/21/2021 at 10:30 pm to provider XENLovey Newcomerwho verbally acknowledged these results. Electronically Signed   By: BenJeannine BogaD.   On: 03/21/2021 22:48     Subjective: Patient seen examined bedside, resting comfortably.  No specific complaints this morning.  Discharging to SNF.  No family present.   Denies headache, no chest pain, no shortness of breath, no abdominal pain.  No acute events overnight per nursing staff.  Discharge Exam: Vitals:   03/23/21 0803 03/23/21 1226  BP: (!) 153/76 (!) 143/67  Pulse: 75 (!) 59  Resp: 16 16  Temp: 98.6 F (37 C) 98.9 F (37.2 C)  SpO2: 97% 100%   Vitals:   03/22/21 2324 03/23/21 0318 03/23/21 0803 03/23/21 1226  BP: (!) 174/98 (!) 158/78 (!) 153/76 (!) 143/67  Pulse: 75 95 75 (!) 59  Resp: '18 16 16 16  '$ Temp: 98.4 F (36.9 C) 99.2 F (37.3 C) 98.6 F (37 C) 98.9 F (37.2 C)  TempSrc: Oral Oral Oral Oral  SpO2: 91% 93% 97% 100%  Weight:      Height:        Physical Exam: GEN: NAD, alert oriented to person/place but not time, chronically ill in appearance HEENT: NCAT, PERRL, EOMI, sclera clear, MMM PULM: CTAB w/o wheezes/crackles, normal respiratory effort, on room air CV: RRR w/o M/G/R GI: abd soft, NTND, NABS, no R/G/M MSK: no peripheral edema, muscle strength left upper extremity 4/5, otherwise muscle strength preserved  NEURO: CN II-XII intact, no focal deficits, sensation to light touch intact PSYCH: Depressed mood, flat affect Integumentary: dry/intact, no rashes or wounds    The results of significant diagnostics from this hospitalization (including imaging, microbiology, ancillary and laboratory) are listed below for reference.     Microbiology: Recent Results (from the past 240 hour(s))  Urine Culture     Status: None   Collection Time: 03/19/21  1:50 AM   Specimen: Urine, Clean Catch  Result Value Ref Range Status   Specimen Description   Final    URINE, CLEAN CATCH Performed at [facility], [place] [address], [place], [place] [address]    Special Requests   Final    NONE Performed at [facility], [place] [address] [place], [place] [address]    Culture   Final    NO GROWTH Performed at [facility], [place] [address], [place], [place] [address]    Report Status  03/20/2021 FINAL  Final  Blood culture (routine x 2)     Status: None (Preliminary result)   Collection Time: 03/19/21  2:50 AM   Specimen: Site Not Specified; Blood  Result Value Ref Range Status   Specimen Description   Final    SITE NOT SPECIFIED Performed at [place] [address], [place], [place] [address]    Special Requests   Final    BOTTLES DRAWN AEROBIC AND ANAEROBIC Blood Culture results may not be optimal due to an excessive volume of blood received in culture bottles Performed at [place] [address] [place], [place] [address]    Culture   Final    NO GROWTH 4 DAYS Performed at [facility], [place] [address], [place], [place] [address]    Report Status PENDING  Incomplete  Resp Panel by RT-PCR (Flu A&B, Covid) Nasopharyngeal Swab     Status: None   Collection Time: 03/19/21  3:39 AM   Specimen: Nasopharyngeal Swab; Nasopharyngeal(NP) swabs in vial transport medium  Result Value Ref Range Status   SARS Coronavirus 2 by RT PCR NEGATIVE NEGATIVE Final    Comment: (NOTE) SARS-CoV-2 target nucleic acids are NOT DETECTED.  The SARS-CoV-2 RNA is generally detectable in upper respiratory specimens during the acute phase of infection. The lowest concentration of SARS-CoV-2 viral copies this assay can detect is 138 copies/mL. A negative result does not preclude SARS-Cov-2 infection and should not be used as the sole basis for treatment or other patient management decisions. A negative result may occur with  improper specimen collection/handling, submission of specimen other than nasopharyngeal swab, presence of viral mutation(s) within the areas targeted by this assay, and inadequate number of viral copies(<138 copies/mL). A negative result must be combined  with clinical observations, patient history, and epidemiological information. The expected result is Negative.  Fact Sheet for Patients:   EntrepreneurPulse.com.au  Fact Sheet for Healthcare Providers:  IncredibleEmployment.be  This test is no t yet approved or cleared by the [place] FDA and  has been authorized for detection and/or diagnosis of SARS-CoV-2 by FDA under an Emergency Use Authorization (EUA). This EUA will remain  in effect (meaning this test can be used) for the duration of the COVID-19 declaration under Section 564(b)(1) of the Act, 21 U.S.C.section 360bbb-3(b)(1), unless the authorization is terminated  or revoked sooner.       Influenza A by PCR NEGATIVE NEGATIVE Final   Influenza B by PCR NEGATIVE NEGATIVE Final    Comment: (NOTE) The Xpert Xpress SARS-CoV-2/FLU/RSV plus assay is intended as an aid in the diagnosis of influenza from Nasopharyngeal swab specimens and should not be used as a sole basis for treatment. Nasal washings and aspirates are unacceptable for Xpert Xpress SARS-CoV-2/FLU/RSV testing.  Fact Sheet for Patients: EntrepreneurPulse.com.au  Fact Sheet for Healthcare Providers: IncredibleEmployment.be  This test is not yet approved or cleared by the [place] FDA and has been authorized for detection and/or diagnosis of SARS-CoV-2 by FDA under an Emergency Use Authorization (EUA). This EUA will remain in effect (meaning this test can be used) for the duration of the COVID-19 declaration under Section 564(b)(1) of the Act, 21 U.S.C. section 360bbb-3(b)(1), unless the authorization is terminated or revoked.  Performed at [facility], [place] [address], [place], [place] [address]   Blood culture (routine x 2)     Status: None (Preliminary result)   Collection Time: 03/19/21  5:53 AM   Specimen: BLOOD RIGHT HAND  Result Value Ref Range Status   Specimen Description   Final    BLOOD RIGHT HAND Performed at [place] [address], [place], [place]  [address]    Special Requests   Final    BOTTLES DRAWN AEROBIC ONLY Blood Culture results may not be optimal due to an inadequate volume of blood received in culture bottles Performed at [place] [address], [place], [place] [address]    Culture   Final    NO GROWTH 4 DAYS Performed at [facility], [place] [address] [place], [place] [address]    Report Status PENDING  Incomplete     Labs: BNP (last 3 results) No results for input(s): BNP in the last 8760 hours. Basic Metabolic Panel: Recent Labs  Lab 03/18/21 2307 03/18/21 2326 03/19/21 0641 03/20/21 0316 03/21/21 0257 03/22/21 0312  NA 138 140 139 138 139 138  K 4.3 4.3 4.8 4.0 3.8 3.5  CL 103 105 110 106 105 105  CO2 23  --  18* '22 25 25  '$ GLUCOSE 231* 237* 172* 105* 148* 130*  BUN 31* 30* 28* '19 18 16  '$ CREATININE 1.53* 1.50* 1.52* 1.29* 1.51* 1.35*  CALCIUM 9.6  --  8.7* 8.7* 9.0 8.9  MG  --   --   --  1.9 2.1  --    Liver Function Tests: Recent Labs  Lab 03/18/21 2307  AST 21  ALT 25  ALKPHOS 61  BILITOT 0.7  PROT 7.9  ALBUMIN 3.8   No results for input(s): LIPASE, AMYLASE in the last 168 hours. Recent Labs  Lab 03/19/21 1237  AMMONIA 12   CBC: Recent Labs  Lab 03/18/21 2307 03/18/21 2326 03/19/21 3419 03/20/21 3790 03/21/21 0257 03/22/21 2409  WBC 8.7  --  11.6* 10.1 9.1 11.1*  NEUTROABS 6.7  --   --   --   --   --   HGB 13.9 14.3 12.4* 12.0* 12.2* 13.3  HCT 42.6 42.0 37.8* 37.7* 37.6* 40.1  MCV 89.7  --  89.8 93.3 89.3 89.1  PLT 232  --  183 143* 203 206   Cardiac Enzymes: No results for input(s): CKTOTAL, CKMB, CKMBINDEX, TROPONINI in the last 168 hours. BNP: Invalid input(s): POCBNP CBG: Recent Labs  Lab 03/22/21 1522 03/22/21 1945 03/22/21 2322 03/23/21 0316 03/23/21 1228  GLUCAP 114* 165* 99 149* 151*   D-Dimer No results for input(s): DDIMER in the last 72 hours. Hgb A1c No results for input(s): HGBA1C in the last 72 hours. Lipid  Profile Recent Labs    03/23/21 0312  CHOL 136  HDL 28*  LDLCALC 78  TRIG 152*  CHOLHDL 4.9   Thyroid function studies No results for input(s): TSH, T4TOTAL, T3FREE, THYROIDAB in the last 72 hours.  Invalid input(s): FREET3 Anemia work up No results for input(s): VITAMINB12, FOLATE, FERRITIN, TIBC, IRON, RETICCTPCT in the last 72 hours. Urinalysis    Component Value Date/Time   COLORURINE YELLOW 03/19/2021 0149   APPEARANCEUR CLEAR 03/19/2021 0149   LABSPEC 1.020 03/19/2021 0149   PHURINE 5.0 03/19/2021 0149   GLUCOSEU NEGATIVE 03/19/2021 0149   HGBUR NEGATIVE 03/19/2021 0149   BILIRUBINUR NEGATIVE 03/19/2021 0149   KETONESUR NEGATIVE 03/19/2021 0149   PROTEINUR 100 (A) 03/19/2021 0149   UROBILINOGEN 0.2 07/02/2013 1829   NITRITE NEGATIVE 03/19/2021 0149   LEUKOCYTESUR NEGATIVE 03/19/2021 0149   Sepsis Labs Invalid input(s): PROCALCITONIN,  WBC,  LACTICIDVEN Microbiology Recent Results (from the past 240 hour(s))  Urine Culture     Status: None   Collection Time: 03/19/21  1:50 AM   Specimen: Urine, Clean Catch  Result Value Ref Range Status   Specimen Description   Final    URINE, CLEAN CATCH Performed at [facility], [place] [address], [place], [place] [address]    Special Requests   Final    NONE Performed at [facility], [place] [address], [place], [place] [address]    Culture   Final    NO GROWTH Performed at [facility], [place] [address], [place], [place] [address]    Report Status 03/20/2021 FINAL  Final  Blood culture (routine x 2)     Status: None (Preliminary result)   Collection Time: 03/19/21  2:50 AM   Specimen: Site Not Specified; Blood  Result Value Ref Range Status   Specimen Description   Final    SITE NOT SPECIFIED Performed at [facility] [address], [place], [place] [address]    Special Requests   Final    BOTTLES DRAWN AEROBIC AND ANAEROBIC Blood Culture results may not  be optimal due to an excessive volume of blood received in culture bottles Performed at [place] [address] [place], [place] [address]    Culture   Final    NO GROWTH 4 DAYS Performed at [facility], [place] [address], [place], [place] [address]    Report Status PENDING  Incomplete  Resp Panel by RT-PCR (Flu A&B, Covid) Nasopharyngeal Swab     Status: None   Collection Time: 03/19/21  3:39 AM   Specimen: Nasopharyngeal Swab; Nasopharyngeal(NP) swabs in vial transport medium  Result Value Ref Range Status   SARS Coronavirus 2 by  RT PCR NEGATIVE NEGATIVE Final    Comment: (NOTE) SARS-CoV-2 target nucleic acids are NOT DETECTED.  The SARS-CoV-2 RNA is generally detectable in upper respiratory specimens during the acute phase of infection. The lowest concentration of SARS-CoV-2 viral copies this assay can detect is 138 copies/mL. A negative result does not preclude SARS-Cov-2 infection and should not be used as the sole basis for treatment or other patient management decisions. A negative result may occur with  improper specimen collection/handling, submission of specimen other than nasopharyngeal swab, presence of viral mutation(s) within the areas targeted by this assay, and inadequate number of viral copies(<138 copies/mL). A negative result must be combined with clinical observations, patient history, and epidemiological information. The expected result is Negative.  Fact Sheet for Patients:  EntrepreneurPulse.com.au  Fact Sheet for Healthcare Providers:  IncredibleEmployment.be  This test is no t yet approved or cleared by the [place] FDA and  has been authorized for detection and/or diagnosis of SARS-CoV-2 by FDA under an Emergency Use Authorization (EUA). This EUA will remain  in effect (meaning this test can be used) for the duration of the COVID-19 declaration under Section 564(b)(1) of the Act,  21 U.S.C.section 360bbb-3(b)(1), unless the authorization is terminated  or revoked sooner.       Influenza A by PCR NEGATIVE NEGATIVE Final   Influenza B by PCR NEGATIVE NEGATIVE Final    Comment: (NOTE) The Xpert Xpress SARS-CoV-2/FLU/RSV plus assay is intended as an aid in the diagnosis of influenza from Nasopharyngeal swab specimens and should not be used as a sole basis for treatment. Nasal washings and aspirates are unacceptable for Xpert Xpress SARS-CoV-2/FLU/RSV testing.  Fact Sheet for Patients: EntrepreneurPulse.com.au  Fact Sheet for Healthcare Providers: IncredibleEmployment.be  This test is not yet approved or cleared by the [place] FDA and has been authorized for detection and/or diagnosis of SARS-CoV-2 by FDA under an Emergency Use Authorization (EUA). This EUA will remain in effect (meaning this test can be used) for the duration of the COVID-19 declaration under Section 564(b)(1) of the Act, 21 U.S.C. section 360bbb-3(b)(1), unless the authorization is terminated or revoked.  Performed at [facility], [place] [address] [place], [place] [address]   Blood culture (routine x 2)     Status: None (Preliminary result)   Collection Time: 03/19/21  5:53 AM   Specimen: BLOOD RIGHT HAND  Result Value Ref Range Status   Specimen Description   Final    BLOOD RIGHT HAND Performed at [facility] [address], [place], [place] [address]    Special Requests   Final    BOTTLES DRAWN AEROBIC ONLY Blood Culture results may not be optimal due to an inadequate volume of blood received in culture bottles Performed at [facility] [address], [place], [place] [address]    Culture   Final    NO GROWTH 4 DAYS Performed at [facility], [place] [address] [place],  [address]    Report Status PENDING  Incomplete     Time coordinating discharge: Over 30  minutes  SIGNED:   Donnamarie Poag [place] DO  [facility] 03/23/2021, 1:49 PM

## 2021-03-23 NOTE — Progress Notes (Signed)
Speech Language Pathology Treatment: Dysphagia  ?Patient Details ?Name: Jesse Nunez ?MRN: [id number] ?DOB: Jul 24, 1940 ?Today's Date: 03/23/2021 ?Time: 5631-4970 ?SLP Time Calculation (min) (ACUTE ONLY): 17 min ? ?Assessment / Plan / Recommendation ?Clinical Impression ? Patient was seen at bedside for further PO trials. Patient's caregiver present at bedside and reports that the pt eats regular foods (I.e., [employer]) with thin liquids independently at baseline. Pt's level of alertness and focused attention for PO trials improved since the last targeted session (03/22/21). Patient was presented with thin liquids via straw cup. Pt drank consecutive sips of thin liquid via straw cup with no overt s/s of aspiration. SLP administered hand-over-hand assisted feeds in order for the pt to receive puree applesauce. Pt ate ~4 bites of applesauce before verbalizing he was "done." Applesauce trial unremarkable. SLP branched up to regular texture graham cracker and pt utilized effective mastication and oral clearance of bolus with no overt s/s of aspiration. SLP recommends initiating regular diet with thin liquids at this time. Patient requires staff assist at mealtimes in order to engage in self-feeding. Medications may be administered whole with liquid or applesauce. SLP to sign off.  ?  ?HPI HPI: Patient is an 81 y.o. male with PMH: DM-2, HTN, OSA, HLD, h/o CVA, dementia, who presented to hospital with nausea, vomiting and diarrhea for few days prior to admission. Patient's nephew was present at time of admission and reported he had been concerned about dehydration, patient had decreased PO intake last few days and when he did eat or drink he had a lot of coughing and gagging. In addition, patient was becoming more lethargic and less active over past two days. In ED, patient did not require supplemental oxygen and was hemodynamically stable, was afebrile. Chest CT showed basilar PNA which could be due to aspiration. GI  evaluated patient and performed EGD on 3/10 which revealed very mild, non-specific gastritis and small hiatal hernia but otherwise normal examination. on 3/11, rapid response was called and patient was found to have had an acute CVA confirmed by MRI brain: multiple small acute posterior circulation infarcts involving the left cerebellar hemisphere and pons, possible small amount of acute ischemia at the anterior aspect of a chronic left parieto-occipital infarct. ?  ?   ?SLP Plan ? Continue with current plan of care ? ?  ?  ?Recommendations for follow up therapy are one component of a multi-disciplinary discharge planning process, led by the attending physician.  Recommendations may be updated based on patient status, additional functional criteria and insurance authorization. ?  ? ?Recommendations  ?Diet recommendations: Regular;Thin liquid ?Liquids provided via: Straw ?Medication Administration: Whole meds with puree ?Supervision: Staff to assist with self feeding ?Compensations: Slow rate;Small sips/bites ?Postural Changes and/or Swallow Maneuvers: Seated upright 90 degrees  ?   ?    ?   ? ? ? ? Oral Care Recommendations: Oral care BID;Staff/trained caregiver to provide oral care ?Follow Up Recommendations: Skilled nursing-short term rehab (<3 hours/day) ?Assistance recommended at discharge: Frequent or constant Supervision/Assistance ?SLP Visit Diagnosis: Dysphagia, unspecified (R13.10) ?Plan: Continue with current plan of care ? ? ? ? ?  ?  ? ? ?Vaughan Sine ? ?03/23/2021, 11:03 AM ?

## 2021-03-23 NOTE — Progress Notes (Addendum)
Occupational Therapy Treatment Patient Details Name: Jesse Nunez MRN: [id number] DOB: January 19, 1940 Today's Date: 03/23/2021   History of present illness Pt is an 81 y.o. male presenting with n/v and diarrhea for the last few days admitted with aspiration pneumonia, AKI, acute metabolic encephalopathy. He underwent a endoscopy with GI that only showed mild gastritis. code stroke on 3/11 for right-sided weakness.  Imaging showed acute infarcts in the left midbrain, pons, and left cerebellum. PMH significant for diabetes type 2, hypertension, OSA, hyperlipidemia, history of CVA, underlying dementia.   OT comments  Pt making slow but steady progress towards acute OT goals. 10/10 R knee pain with movement limiting session. Pt holding RLE in NWB position in standing position. Tolerated sitting EOB about 8 minutes with up to max +2 assist for safety during dynamic sitting tasks. R side inattention/L gaze preference remains. Max multimodal cueing needed throughout and 50% one-step command following. Max A to squat-pivot from EOB to recliner. Recommend staff utilize [employer] lift for back to bed. D/c recommendation remains appropriate.    Recommendations for follow up therapy are one component of a multi-disciplinary discharge planning process, led by the attending physician.  Recommendations may be updated based on patient status, additional functional criteria and insurance authorization.    Follow Up Recommendations  Skilled nursing-short term rehab (<3 hours/day)    Assistance Recommended at Discharge Frequent or constant Supervision/Assistance  Patient can return home with the following  Two people to help with walking and/or transfers;Two people to help with bathing/dressing/bathroom;Assistance with cooking/housework;Assistance with feeding;Direct supervision/assist for medications management;Direct supervision/assist for financial management;Assist for transportation;Help with stairs or ramp for entrance    Equipment Recommendations  Other (comment) (defer to next venue)    Recommendations for Other Services      Precautions / Restrictions Precautions Precautions: Fall Restrictions Weight Bearing Restrictions: No Other Position/Activity Restrictions: right side inattention       Mobility Bed Mobility Overal bed mobility: Needs Assistance Bed Mobility: Supine to Sit, Sit to Supine     Supine to sit: Max assist, HOB elevated, +2 for safety/equipment Sit to supine: Max assist, +2 for safety/equipment   General bed mobility comments: able to help advance RLE across bed with assist to unweight. Max assist to pivot hips to full EOB position and for truncal support. Multimodal cues throughout    Transfers Overall transfer level: Needs assistance Equipment used: Rolling walker (2 wheels) Transfers: Sit to/from Stand, Bed to chair/wheelchair/BSC Sit to Stand: Mod assist, +2 physical assistance, +2 safety/equipment, From elevated surface   Squat pivot transfers: Max assist       General transfer comment: Pt holding RLE in NWB position d/t severe knee pain. Successful on second standing trial utilizing momentum and bed under hips. Stood 1x briefly. Max to squat pivot into recliner. Will need Hoyer back to bed,     Balance Overall balance assessment: Needs assistance Sitting-balance support: Single extremity supported Sitting balance-Leahy Scale: Zero Sitting balance - Comments: Requires a lot of physical assistance to maintain upright. Falling over to the left - at times can use LUE to prop himself Postural control: Left lateral lean Standing balance support: Bilateral upper extremity supported, During functional activity, Reliant on assistive device for balance Standing balance-Leahy Scale: Zero Standing balance comment: reliant on external support. Pt unable to tolerate WB through RLE                           ADL either performed or  assessed with clinical judgement    ADL Overall ADL's : Needs assistance/impaired     Grooming: Sitting;Maximal assistance                                 General ADL Comments: Tolerated sitting EOB about 8 minutes prior to transfer to recliner with max A. Max multimodal cueing needed. Patient needing significant assistance for all self care tasks due to poor to zero sitting balance with L lateral and anterior > posterior lean. Difficulty following one step commands    Extremity/Trunk Assessment Upper Extremity Assessment Upper Extremity Assessment: RUE deficits/detail;LUE deficits/detail;Generalized weakness;Difficult to assess due to impaired cognition RUE Deficits / Details: proximal weakness > distal. fair hand grip strength 2/5 AROM shoulder to wrist. impaired gross and fine motor coordination. r side neglect. not hold R hand in supportive position EOB, RUE Sensation: decreased proprioception RUE Coordination: decreased fine motor;decreased gross motor LUE Deficits / Details: 3-/5 shoulder flexion, 3/5 elbow and distal. Difficulty motor planning/sequencing. LUE Coordination: decreased fine motor;decreased gross motor   Lower Extremity Assessment Lower Extremity Assessment: Defer to PT evaluation        Vision   Additional Comments: difficult to assess d/t impaired cognition. left gaze preference.   Perception     Praxis      Cognition Arousal/Alertness: Awake/alert Behavior During Therapy: Flat affect, Anxious Overall Cognitive Status: History of cognitive impairments - at baseline                                 General Comments: acute on chronic cognitive deficits noted. focused attention. following one-step commands 50% of the time. limited verbalizations, answering yes/no questions only. "I don't know"        Exercises      Shoulder Instructions       General Comments Personal care attendent present during session.    Pertinent Vitals/ Pain       Pain  Assessment Pain Assessment: Faces Faces Pain Scale: Hurts worst Pain Location: R knee with motion Pain Descriptors / Indicators: Moaning, Grimacing, Guarding Pain Intervention(s): Limited activity within patient's tolerance, Monitored during session, Repositioned, Patient requesting pain meds-RN notified, RN gave pain meds during session  Home Living                                          Prior Functioning/Environment              Frequency  Min 2X/week        Progress Toward Goals  OT Goals(current goals can now be found in the care plan section)  Progress towards OT goals: Progressing toward goals  Acute Rehab OT Goals Patient Stated Goal: not stated. eager to get into recliner when asked. OT Goal Formulation: Patient unable to participate in goal setting Time For Goal Achievement: 04/03/21 Potential to Achieve Goals: Fair ADL Goals Pt Will Perform Grooming: with set-up;with supervision;sitting Pt Will Perform Upper Body Bathing: with supervision;with set-up;sitting Pt Will Transfer to Toilet: with mod assist;stand pivot transfer;bedside commode Additional ADL Goal #1: Patient will demonstrate good sitting balance for 10 minutes in order to participate in self care tasks.  Plan Discharge plan remains appropriate    Co-evaluation    PT/OT/SLP Co-Evaluation/Treatment: Yes Reason for Co-Treatment: Complexity  of the patient's impairments (multi-system involvement);For patient/therapist safety   OT goals addressed during session: ADL's and self-care;Strengthening/ROM      AM-PAC OT "6 Clicks" Daily Activity     Outcome Measure   Help from another person eating meals?: A Lot Help from another person taking care of personal grooming?: A Lot Help from another person toileting, which includes using toliet, bedpan, or urinal?: Total Help from another person bathing (including washing, rinsing, drying)?: Total Help from another person to put on and  taking off regular upper body clothing?: A Lot Help from another person to put on and taking off regular lower body clothing?: Total 6 Click Score: 9    End of Session Equipment Utilized During Treatment: Rolling walker (2 wheels)  OT Visit Diagnosis: Other abnormalities of gait and mobility (R26.89);Muscle weakness (generalized) (M62.81)   Activity Tolerance Patient limited by pain   Patient Left in chair;with call bell/phone within reach;with chair alarm set;with family/visitor present   Nurse Communication Patient requests pain meds;Mobility status;Need for lift equipment        Time: 2122-4825 OT Time Calculation (min): 38 min  Charges: OT General Charges $OT Visit: 1 Visit OT Treatments $Self Care/Home Management : 8-22 mins  Tyrone Schimke, OT Acute Rehabilitation Services Office: [phone]   Hortencia Pilar 03/23/2021, 11:46 AM

## 2021-03-23 NOTE — Assessment & Plan Note (Signed)
Attempted voiding trial while inpatient, failed.  Continue Foley catheter on discharge.  Continue tamsulosin 0.4 mg p.o. daily.  Recommend outpatient follow-up with urology or another attempt at voiding trial. ?

## 2021-03-23 NOTE — Evaluation (Signed)
Physical Therapy Evaluation Patient Details Name: Jesse Nunez MRN: [id number] DOB: [birth year] Today's Date: 03/23/2021  History of Present Illness  Pt is an 81 y.o. male presenting with n/v and diarrhea for the last few days admitted with aspiration pneumonia, AKI, acute metabolic encephalopathy. He underwent a endoscopy with GI that only showed mild gastritis. code stroke on 3/11 for right-sided weakness.  Imaging showed acute infarcts in the left midbrain, pons, and left cerebellum. PMH significant for diabetes type 2, hypertension, OSA, hyperlipidemia, history of CVA, underlying dementia.  Clinical Impression  Pt progressing slowly toward goals.  Will downgrade goals to a more attainable level over the next 2 weeks.  Emphasis on transition, sitting balance/upright posture and sit to stand before transfer to the recliner.  Pt needing mod to max of 2 persons for most activity.        Recommendations for follow up therapy are one component of a multi-disciplinary discharge planning process, led by the attending physician.  Recommendations may be updated based on patient status, additional functional criteria and insurance authorization.  Follow Up Recommendations Skilled nursing-short term rehab (<3 hours/day)    Assistance Recommended at Discharge Frequent or constant Supervision/Assistance  Patient can return home with the following  Two people to help with walking and/or transfers;Two people to help with bathing/dressing/bathroom;Assistance with cooking/housework;Direct supervision/assist for medications management;Direct supervision/assist for financial management;Assist for transportation;Help with stairs or ramp for entrance    Equipment Recommendations Rolling walker (2 wheels)  Recommendations for Other Services       Functional Status Assessment Patient has had a recent decline in their functional status and demonstrates the ability to make significant improvements in function in a  reasonable and predictable amount of time.     Precautions / Restrictions Precautions Precautions: Fall Restrictions Weight Bearing Restrictions: No Other Position/Activity Restrictions: right side inattention      Mobility  Bed Mobility Overal bed mobility: Needs Assistance Bed Mobility: Supine to Sit, Sit to Supine     Supine to sit: Max assist, HOB elevated, +2 for safety/equipment Sit to supine: Max assist, +2 for safety/equipment   General bed mobility comments: able to help advance RLE across bed with assist to unweight. Max assist to pivot hips to full EOB position and for truncal support. Multimodal cues throughout    Transfers Overall transfer level: Needs assistance Equipment used: Rolling walker (2 wheels) Transfers: Sit to/from Stand, Bed to chair/wheelchair/BSC Sit to Stand: Mod assist, +2 physical assistance, +2 safety/equipment, From elevated surface     Squat pivot transfers: Max assist     General transfer comment: Pt holding RLE in NWB position d/t severe knee pain. Successful on second standing trial utilizing momentum and bed under hips. Stood 1x briefly. Max to squat pivot into recliner. Will need Hoyer back to bed,    Ambulation/Gait                  Stairs            Wheelchair Mobility    Modified Rankin (Stroke Patients Only)       Balance Overall balance assessment: Needs assistance Sitting-balance support: Single extremity supported Sitting balance-Leahy Scale: Zero Sitting balance - Comments: Requires a lot of physical assistance to maintain upright. Falling over to the left - at times can use LUE to prop himself Postural control: Left lateral lean Standing balance support: Bilateral upper extremity supported, During functional activity, Reliant on assistive device for balance Standing balance-Leahy Scale: Zero Standing balance comment: reliant  on external support. Pt unable to tolerate WB through RLE                              Pertinent Vitals/Pain Pain Assessment Pain Assessment: Faces Pain Location: R knee with motion Pain Descriptors / Indicators: Moaning, Grimacing, Guarding    Home Living                          Prior Function                       Hand Dominance        Extremity/Trunk Assessment   Upper Extremity Assessment Upper Extremity Assessment: Defer to OT evaluation RUE Deficits / Details: proximal weakness > distal. fair hand grip strength 2/5 AROM shoulder to wrist. impaired gross and fine motor coordination. r side neglect. not hold R hand in supportive position EOB, RUE Sensation: decreased proprioception RUE Coordination: decreased fine motor;decreased gross motor LUE Deficits / Details: 3-/5 shoulder flexion, 3/5 elbow and distal. Difficulty motor planning/sequencing. LUE Coordination: decreased fine motor;decreased gross motor    Lower Extremity Assessment Lower Extremity Assessment: Generalized weakness;RLE deficits/detail;LLE deficits/detail RLE Deficits / Details: Pt moved leg painfully with assist to approx 70 deg knee flexion before yelling out.  pt unable to bear weight and w/drew from the floor actively. RLE: Unable to fully assess due to pain RLE Coordination: decreased fine motor LLE Deficits / Details: moved against gravity, bore weight in stance, grossly 4/5 LLE Coordination: decreased fine motor    Cervical / Trunk Assessment Cervical / Trunk Assessment: Normal  Communication      Cognition Arousal/Alertness: Awake/alert Behavior During Therapy: Flat affect, Anxious Overall Cognitive Status: History of cognitive impairments - at baseline                                 General Comments: acute on chronic cognitive deficits noted. focused attention. following one-step commands 50% of the time. limited verbalizations, answering yes/no questions only. "I don't know"        General Comments General comments  (skin integrity, edema, etc.): Personal care attendent present during session.    Exercises     Assessment/Plan    PT Assessment Patient needs continued PT services  PT Problem List Decreased strength;Decreased range of motion;Decreased activity tolerance;Decreased balance;Decreased mobility;Decreased coordination;Decreased knowledge of use of DME       PT Treatment Interventions DME instruction;Gait training;Stair training;Functional mobility training;Therapeutic activities;Therapeutic exercise;Balance training;Neuromuscular re-education;Patient/family education    PT Goals (Current goals can be found in the Care Plan section)  Acute Rehab PT Goals Patient Stated Goal: none stated by pt- Caregiver reports goals of being able to go back to independent living. Reports that family would also like that, no family present to confirm. PT Goal Formulation: Patient unable to participate in goal setting Time For Goal Achievement: 04/03/21 Potential to Achieve Goals: Fair    Frequency Min 3X/week     Co-evaluation PT/OT/SLP Co-Evaluation/Treatment: Yes Reason for Co-Treatment: Complexity of the patient's impairments (multi-system involvement) PT goals addressed during session: Mobility/safety with mobility;Strengthening/ROM OT goals addressed during session: ADL's and self-care;Strengthening/ROM       AM-PAC PT "6 Clicks" Mobility  Outcome Measure Help needed turning from your back to your side while in a flat bed without using bedrails?: A Little Help needed moving  from lying on your back to sitting on the side of a flat bed without using bedrails?: A Lot Help needed moving to and from a bed to a chair (including a wheelchair)?: Total Help needed standing up from a chair using your arms (e.g., wheelchair or bedside chair)?: Total Help needed to walk in hospital room?: Total Help needed climbing 3-5 steps with a railing? : Total 6 Click Score: 9    End of Session   Activity  Tolerance: Patient limited by pain Patient left: in chair;with call bell/phone within reach;with chair alarm set;with nursing/sitter in room Nurse Communication: Mobility status PT Visit Diagnosis: Unsteadiness on feet (R26.81);Muscle weakness (generalized) (M62.81);Difficulty in walking, not elsewhere classified (R26.2);Other abnormalities of gait and mobility (R26.89)    Time: 4259-5638 PT Time Calculation (min) (ACUTE ONLY): 38 min   Charges:   PT Evaluation $PT Re-evaluation: 1 Re-eval PT Treatments $Neuromuscular Re-education: 8-22 mins        03/23/2021  Ginger Carne., PT Acute Rehabilitation Services [phone]  (pager) [phone]  (office)  Tessie Fass Evander Macaraeg 03/23/2021, 12:23 PM

## 2021-03-23 NOTE — Assessment & Plan Note (Signed)
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  Recommend annual imaging followed by CTA or MRA. ?

## 2021-03-23 NOTE — TOC Progression Note (Signed)
Transition of Care (TOC) - Progression Note  ? ? ?Patient Details  ?Name: Jesse Nunez ?MRN: [id number] ?Date of Birth: 01-17-1940 ? ?Transition of Care (TOC) CM/SW Contact  ?Ross Ludwig, LCSW ?Phone Number: ?03/23/2021, 9:49 AM ? ?Clinical Narrative:    ? ?CSW received phone call from niece Leonides Grills, [phone].  CSW informed her that since patient transferred to [facility], the [facility] member over there will take over for discharge plannning.  CSW updated the assigned TOC member to follow up with patient's family.  This CSW signing off. ? ? ?Expected Discharge Plan:  ([place] to [place] apt vs possible SNF[place] ?Barriers to Discharge: Continued Medical Work up ? ?Expected Discharge Plan and Services ?Expected Discharge Plan:  ([place] to [place] vs possible SNF[place] ?In-house Referral: Clinical Social Work ?  ?  ?Living arrangements for the past 2 months: [place] [facility]) ?                ?  ?  ?  ?  ?  ?  ?  ?  ?  ?  ? ? ?Social Determinants of Health (SDOH) Interventions ?  ? ?Readmission Risk Interventions ?No flowsheet data found. ? ?

## 2021-03-23 NOTE — TOC Transition Note (Signed)
Transition of Care (TOC) - CM/SW Discharge Note ? ? ?Patient Details  ?Name: Jesse Nunez ?MRN: [id number] ?Date of Birth: 10/17/1940 ? ?Transition of Care [facility]) CM/SW Contact:  ?Geralynn Ochs, LCSW ?Phone Number: ?03/23/2021, 2:15 PM ? ? ?Clinical Narrative:   CSW spoke with nephew, who chose [facility] in [place] for SNF. [facility] has a bed available. CSW obtained insurance approval, patient can admit to SNF today. Transport scheduled with [facility]. ? ?Nurse to call report to [phone]. ? ?Nurse please call nephew [name] when Corey Harold arrives for patient: [phone]. ? ? ? ?Final next level of care: [place] ?Barriers to Discharge: Barriers Resolved ? ? ?Patient Goals and CMS Choice ?Patient states their goals for this hospitalization and ongoing recovery are:: unable to state ?  ?  ? ?Discharge Placement ?  ?           ?Patient chooses bed at: [facility], [place] ?Patient to be transferred to facility by: [facility] ?Name of family member notified: [name] ?Patient and family notified of of transfer: 03/23/21 ? ?Discharge Plan and Services ?In-house Referral: Clinical Social Work ?  ?           ?  ?  ?  ?  ?  ?  ?  ?  ?  ?  ? ?Social Determinants of Health (SDOH) Interventions ?  ? ? ?Readmission Risk Interventions ?No flowsheet data found. ? ? ? ? ?

## 2021-03-23 NOTE — Progress Notes (Signed)
Jesse Nunez to be D/C'd to [facility] per MD order. Report called to [facility], LPN. ?Skin clean, dry and intact without evidence of skin break down, no evidence of skin tears noted.  ?IV catheter discontinued intact. Site without signs and symptoms of complications. Dressing and pressure applied.  ?An After Visit Summary was printed and given to the patient.  ?Patient escorted via stretcher, and D/C to [facility] via [facility]. Patient's nephew, [name] notified. ?[name]  ?03/23/2021 6:29 PM ?  ?   ?

## 2021-03-23 NOTE — Plan of Care (Signed)
?  Problem: Education: ?Goal: Knowledge of disease or condition will improve ?Outcome: Not Progressing ?Goal: Knowledge of secondary prevention will improve (SELECT ALL) ?Outcome: Not Progressing ?  ?

## 2021-03-24 LAB — CULTURE, BLOOD (ROUTINE X 2)
Culture: NO GROWTH
Culture: NO GROWTH

## 2021-04-13 ENCOUNTER — Telehealth: Payer: Self-pay | Admitting: Neurology

## 2021-04-13 ENCOUNTER — Ambulatory Visit: Payer: Medicare Other | Admitting: Neurology

## 2021-04-13 NOTE — Telephone Encounter (Signed)
Pt's niece, Olegario Shearer cancelled appt due to pt in skilled nursing. ?

## 2021-04-15 ENCOUNTER — Encounter (HOSPITAL_COMMUNITY): Payer: Self-pay | Admitting: Radiology

## 2021-05-11 DEATH — deceased

## 2022-10-22 IMAGING — CT CT ABD-PELV W/ CM
2 of 5 series · 15 of 46 positions shown, 17 images · IV contrast (agent unspecified)
Comparison: CT with IV and oral contrast 05/08/2020, chest CT no
contrast 07/04/2013.

CLINICAL DATA: Nausea, vomiting and weakness.

EXAM:
CT ABDOMEN AND PELVIS WITH CONTRAST
TECHNIQUE: Multidetector CT imaging of the abdomen and pelvis was performed
using the standard protocol following bolus administration of
intravenous contrast.

[Series 2: axial st · axial · 0.85mm/px · z∈[-541,-81]mm · 12 of 108 slices shown, 14 images]
[im 8/108  soft-tissue]
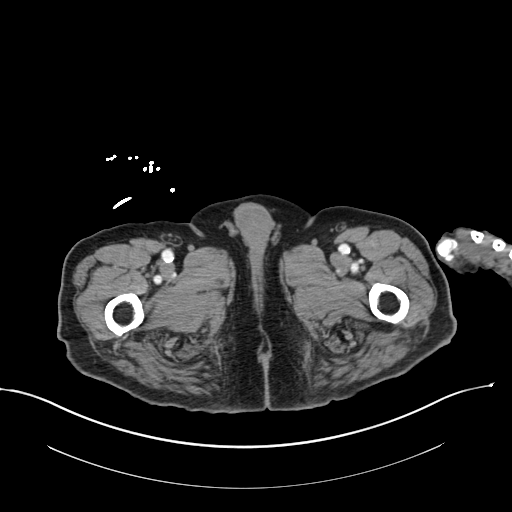
[im 8/108  bone]
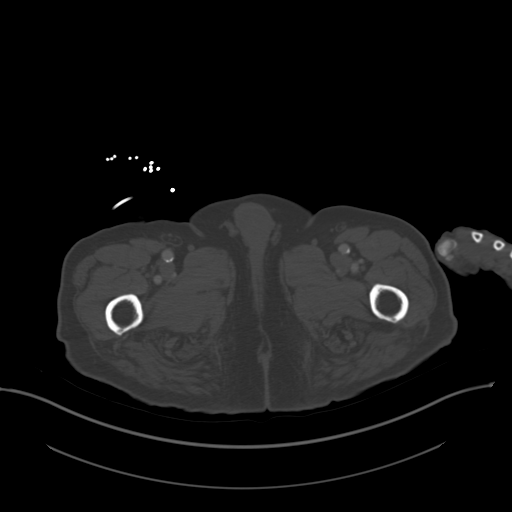
[im 15/108  soft-tissue]
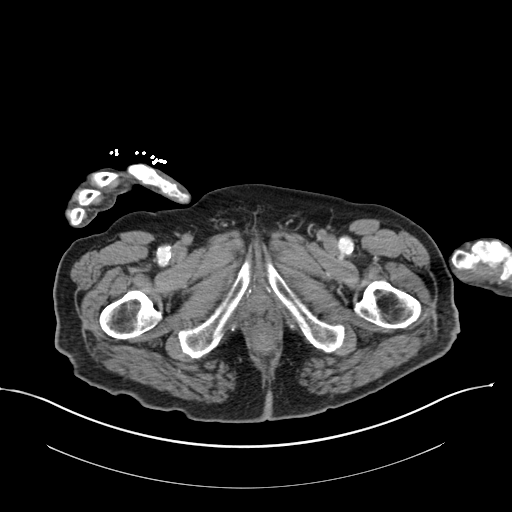
[im 22/108  soft-tissue]
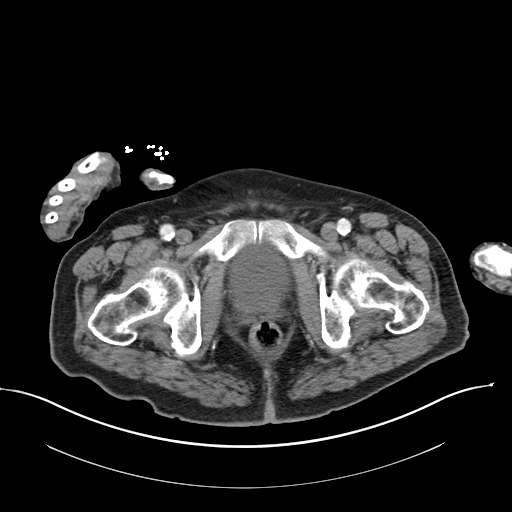
[im 36/108  soft-tissue]
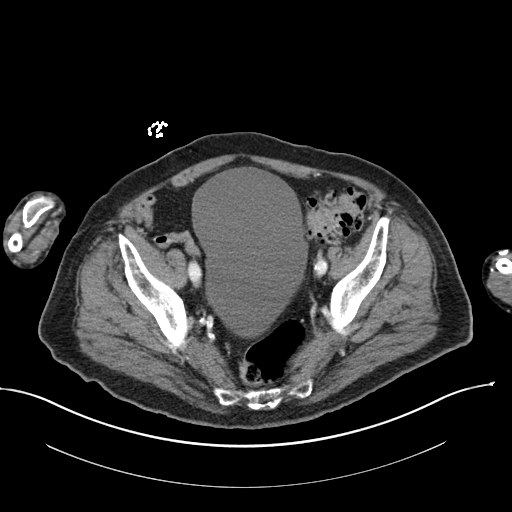
[im 43/108  soft-tissue]
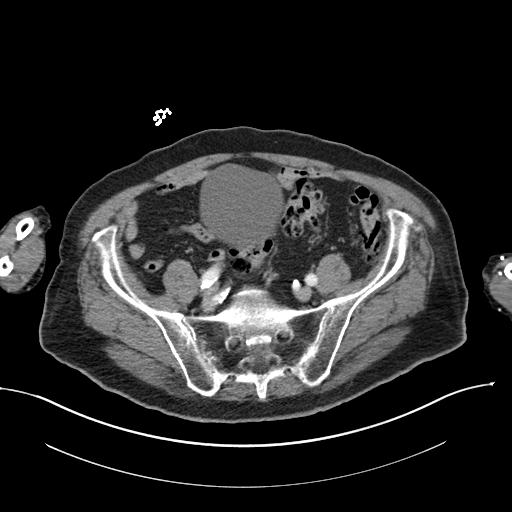
[im 50/108  soft-tissue]
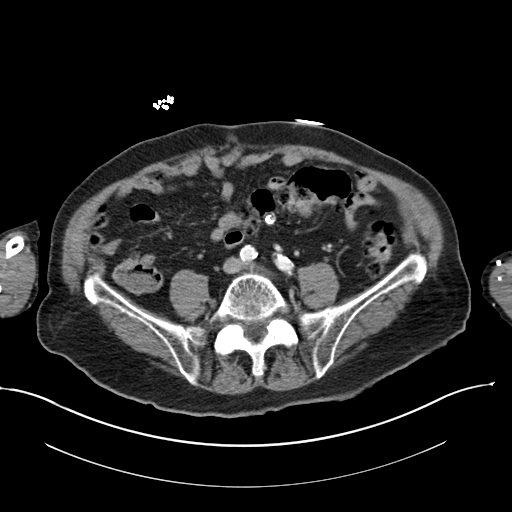
[im 58/108  soft-tissue]
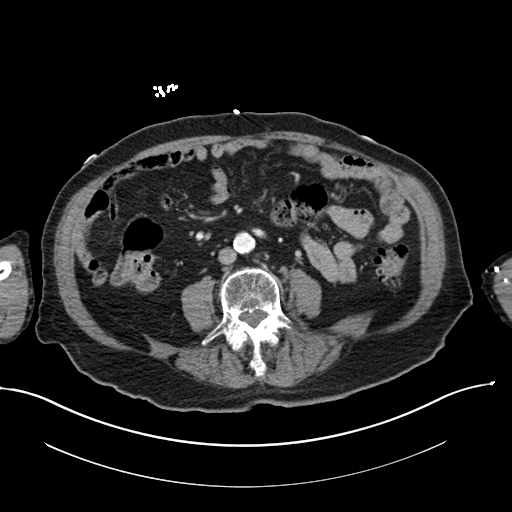
[im 65/108  soft-tissue]
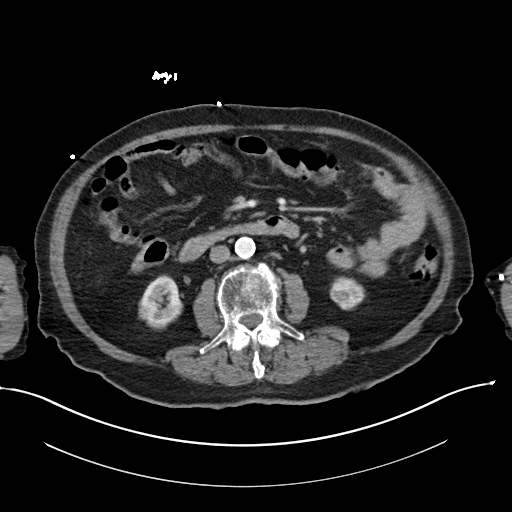
[im 72/108  soft-tissue]
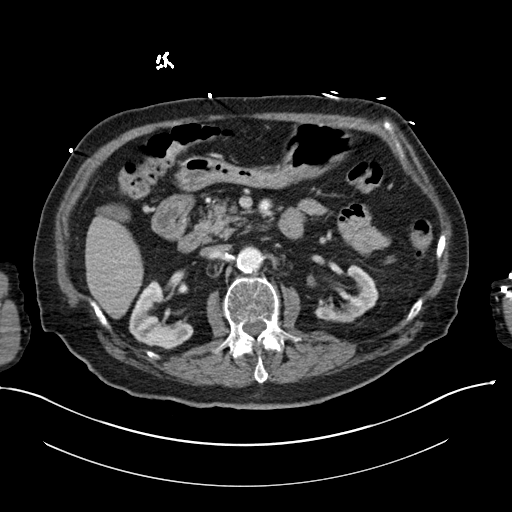
[im 72/108  bone]
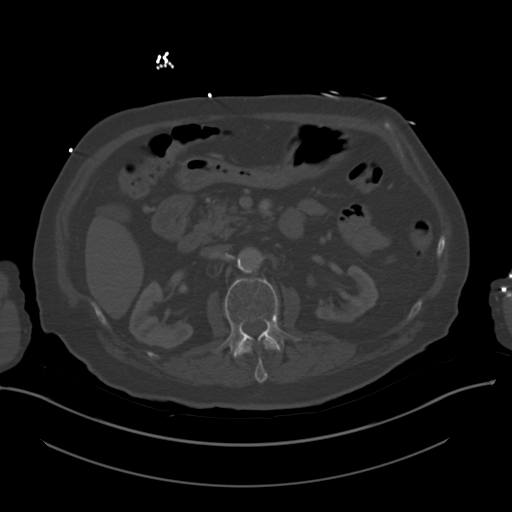
[im 86/108  soft-tissue]
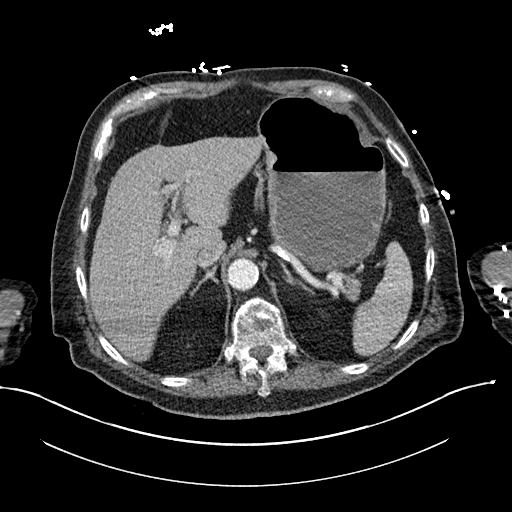
[im 93/108  soft-tissue]
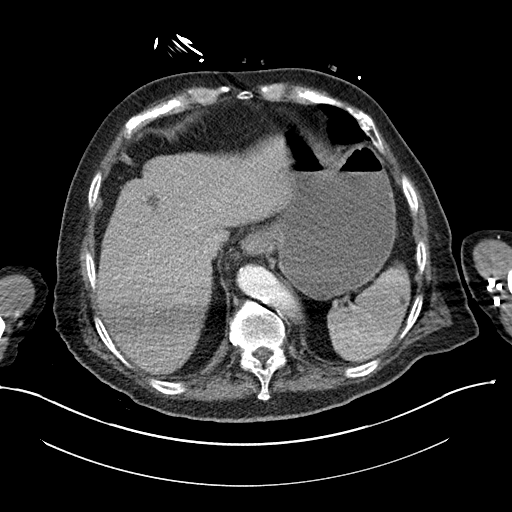
[im 100/108  soft-tissue]
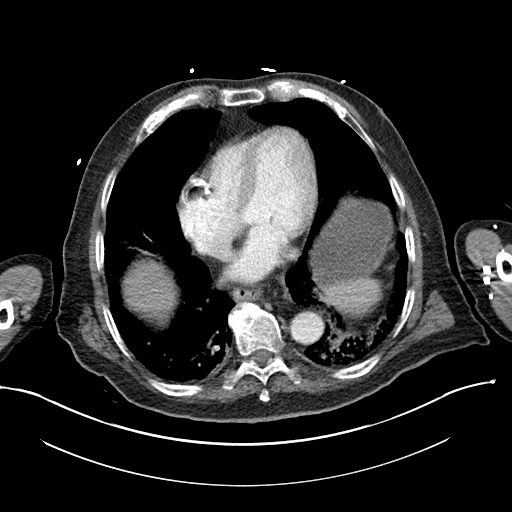

[Series 4: coronal st · coronal · 0.96mm/px · 3 of 151 slices shown]
[im 51/151  soft-tissue]
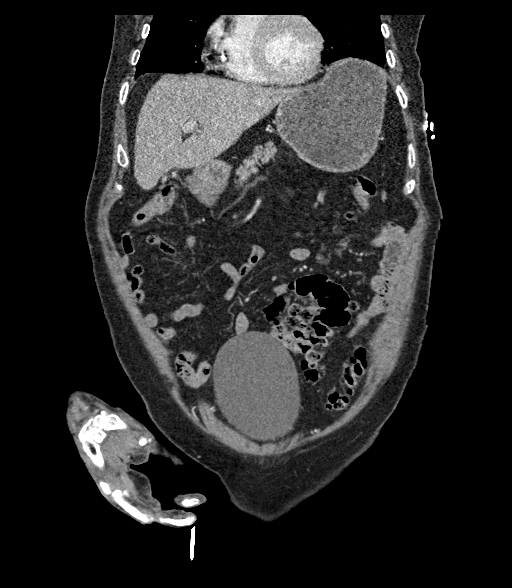
[im 67/151  soft-tissue]
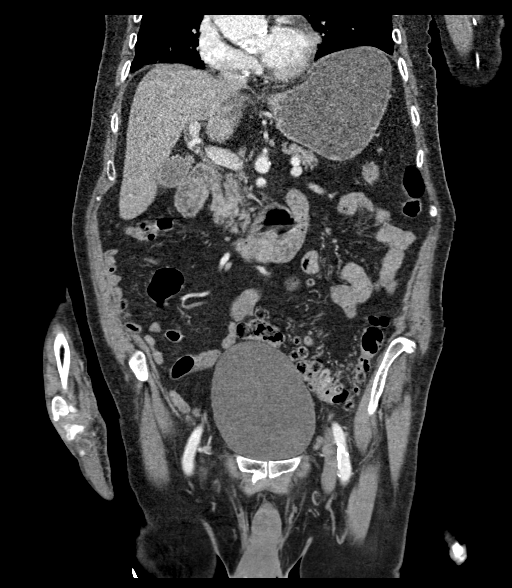
[im 84/151  soft-tissue]
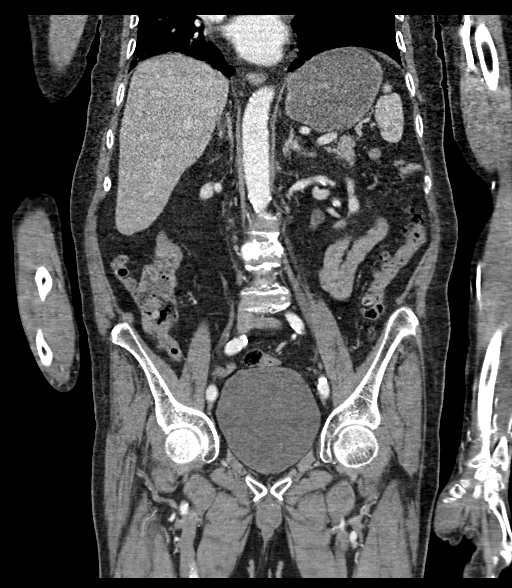

[15 of 46 positions shown; findings below may reference images not displayed]

RADIATION DOSE REDUCTION: This exam was performed according to the
departmental dose-optimization program which includes automated
exposure control, adjustment of the mA and/or kV according to
patient size and/or use of iterative reconstruction technique.

CONTRAST:  100mL OMNIPAQUE IOHEXOL 300 MG/ML  SOLN
FINDINGS: Lower chest: The cardiac size is normal. There calcifications in the
aortic valve annulus, circumflex and right coronary arteries. Small
hiatal hernia. Minimal left pleural effusion.

There is increased airspace consolidation in the basal segments of
both lower lobes, denser on the left and most likely due to
pneumonia or aspiration. More anteriorly the remaining lung bases
are clear.

Factors affecting quality in the abdomen: Streak artifact from the
patient's arms, and artifact due to respiratory motion.

Hepatobiliary: 18.5 cm length mildly steatotic liver. No mass
enhancement. 1.5 cm cyst in the dome of the right lobe in the
anterior segment is again shown. Gallbladder contains at least 1
small stone, possibly two but there is no wall thickening or biliary
dilatation. No portal vein dilatation.

Pancreas: Unremarkable.

Spleen: Normal in size. Few tiny hypodensities are again noted but
unchanged. No splenomegaly.

Adrenals/Urinary Tract: There is no adrenal mass , no renal cortical
mass , no evidence of urinary stones or obstruction. There are
renovascular calcifications at the renal hila. Moderate bladder
distention is seen with the dome reaching the level of L4-5. There
is no wall thickening.

Stomach/Bowel: Stomach is distended with fluid and there is fold
thickening without inflammatory change. The unopacified small bowel
is unremarkable. The appendix again noted surgically absent. There
is colonic diverticulosis greatest along the left colon without
evidence of acute diverticulitis or wall thickening.

Vascular/Lymphatic: Heavy aortoiliac atherosclerosis. No AAA. No
adenopathy.

Reproductive: No prostatomegaly.

Other: Small umbilical and inguinal fat hernias. There is no free
air, hemorrhage or fluid.

Musculoskeletal: There are chronic compression fractures, mild at
T12, L2, and more prominently at L3 along the lower plate where
there is posterior retropulsion moderately narrowing the spinal
canal. This was seen previously and unchanged.

L3 vertebral height loss remains at 50%. There is osteopenia and
degenerative change. There is unilateral ankylosis across the left
SI joint.
IMPRESSION: 1. Bilateral lower lobe opacities, denser on the left consistent
with multilobar pneumonia or aspiration. Minimal left pleural
effusion. Follow-up CT recommended after treatment to ensure
clearing.
2. No acute abnormality demonstrated in the abdomen or pelvis.
3. Cholelithiasis.
4. Aortic atherosclerosis.
5. Bladder distention without wall thickening. No significant
prostate enlargement.
6. Advanced diverticulosis without evidence of diverticulitis.
7. Fold thickening in the stomach consistent with gastritis, and
gastric fluid distention which could be due to recent ingestion or
impaired gastric emptying.
8. Remaining findings described above including chronic L3 moderate
compression fracture from the lower plate with stenosing
retropulsion.
